# Patient Record
Sex: Male | Born: 1960 | Race: White | Hispanic: No | Smoking: Former smoker
Health system: Southern US, Community
[De-identification: ages and names within clinical notes are randomized; demographics above are authoritative.]

## PROBLEM LIST (undated history)

## (undated) DIAGNOSIS — J8410 Pulmonary fibrosis, unspecified: Secondary | ICD-10-CM

## (undated) DIAGNOSIS — R197 Diarrhea, unspecified: Secondary | ICD-10-CM

## (undated) DIAGNOSIS — M1990 Unspecified osteoarthritis, unspecified site: Secondary | ICD-10-CM

## (undated) DIAGNOSIS — S0990XA Unspecified injury of head, initial encounter: Secondary | ICD-10-CM

## (undated) DIAGNOSIS — G8929 Other chronic pain: Secondary | ICD-10-CM

## (undated) DIAGNOSIS — G4730 Sleep apnea, unspecified: Secondary | ICD-10-CM

## (undated) DIAGNOSIS — J849 Interstitial pulmonary disease, unspecified: Secondary | ICD-10-CM

## (undated) DIAGNOSIS — E119 Type 2 diabetes mellitus without complications: Secondary | ICD-10-CM

## (undated) DIAGNOSIS — Z87442 Personal history of urinary calculi: Secondary | ICD-10-CM

## (undated) DIAGNOSIS — F419 Anxiety disorder, unspecified: Secondary | ICD-10-CM

## (undated) DIAGNOSIS — F988 Other specified behavioral and emotional disorders with onset usually occurring in childhood and adolescence: Secondary | ICD-10-CM

## (undated) HISTORY — DX: Diarrhea, unspecified: R19.7

## (undated) HISTORY — PX: HIP SURGERY: SHX245

---

## 2000-10-12 ENCOUNTER — Emergency Department (HOSPITAL_COMMUNITY): Admission: EM | Admit: 2000-10-12 | Discharge: 2000-10-12 | Payer: Self-pay | Admitting: Emergency Medicine

## 2001-04-17 ENCOUNTER — Ambulatory Visit (HOSPITAL_COMMUNITY): Admission: RE | Admit: 2001-04-17 | Discharge: 2001-04-17 | Payer: Self-pay | Admitting: Family Medicine

## 2001-04-17 ENCOUNTER — Encounter: Payer: Self-pay | Admitting: Family Medicine

## 2001-07-26 ENCOUNTER — Encounter: Admission: RE | Admit: 2001-07-26 | Discharge: 2001-10-24 | Payer: Self-pay | Admitting: Orthopedic Surgery

## 2003-06-02 ENCOUNTER — Encounter: Admission: RE | Admit: 2003-06-02 | Discharge: 2003-08-31 | Payer: Self-pay | Admitting: Orthopaedic Surgery

## 2003-12-30 ENCOUNTER — Encounter: Admission: RE | Admit: 2003-12-30 | Discharge: 2004-01-28 | Payer: Self-pay | Admitting: Orthopaedic Surgery

## 2010-02-04 ENCOUNTER — Encounter
Admission: RE | Admit: 2010-02-04 | Discharge: 2010-02-09 | Payer: Self-pay | Admitting: Physical Medicine & Rehabilitation

## 2010-02-09 ENCOUNTER — Ambulatory Visit: Payer: Self-pay | Admitting: Physical Medicine & Rehabilitation

## 2014-05-04 ENCOUNTER — Encounter (HOSPITAL_COMMUNITY): Payer: Self-pay | Admitting: Emergency Medicine

## 2014-05-04 ENCOUNTER — Emergency Department (HOSPITAL_COMMUNITY)
Admission: EM | Admit: 2014-05-04 | Discharge: 2014-05-04 | Disposition: A | Discharge status: Home / Self Care | Payer: PRIVATE HEALTH INSURANCE | Attending: Emergency Medicine | Admitting: Emergency Medicine

## 2014-05-04 DIAGNOSIS — Z79899 Other long term (current) drug therapy: Secondary | ICD-10-CM | POA: Diagnosis not present

## 2014-05-04 DIAGNOSIS — M25569 Pain in unspecified knee: Secondary | ICD-10-CM | POA: Diagnosis not present

## 2014-05-04 DIAGNOSIS — L03211 Cellulitis of face: Secondary | ICD-10-CM | POA: Diagnosis not present

## 2014-05-04 DIAGNOSIS — L0390 Cellulitis, unspecified: Secondary | ICD-10-CM

## 2014-05-04 DIAGNOSIS — G8929 Other chronic pain: Secondary | ICD-10-CM | POA: Diagnosis not present

## 2014-05-04 DIAGNOSIS — F17200 Nicotine dependence, unspecified, uncomplicated: Secondary | ICD-10-CM | POA: Insufficient documentation

## 2014-05-04 DIAGNOSIS — F988 Other specified behavioral and emotional disorders with onset usually occurring in childhood and adolescence: Secondary | ICD-10-CM | POA: Diagnosis not present

## 2014-05-04 DIAGNOSIS — A4902 Methicillin resistant Staphylococcus aureus infection, unspecified site: Secondary | ICD-10-CM | POA: Insufficient documentation

## 2014-05-04 DIAGNOSIS — L0201 Cutaneous abscess of face: Secondary | ICD-10-CM | POA: Insufficient documentation

## 2014-05-04 DIAGNOSIS — B9562 Methicillin resistant Staphylococcus aureus infection as the cause of diseases classified elsewhere: Secondary | ICD-10-CM

## 2014-05-04 DIAGNOSIS — M25552 Pain in left hip: Secondary | ICD-10-CM

## 2014-05-04 DIAGNOSIS — R21 Rash and other nonspecific skin eruption: Secondary | ICD-10-CM | POA: Insufficient documentation

## 2014-05-04 HISTORY — DX: Other specified behavioral and emotional disorders with onset usually occurring in childhood and adolescence: F98.8

## 2014-05-04 HISTORY — DX: Other chronic pain: G89.29

## 2014-05-04 LAB — SEDIMENTATION RATE: SED RATE: 40 mm/h — AB (ref 0–16)

## 2014-05-04 LAB — CBC WITH DIFFERENTIAL/PLATELET
BASOS ABS: 0.1 10*3/uL (ref 0.0–0.1)
BASOS PCT: 1 % (ref 0–1)
EOS ABS: 0 10*3/uL (ref 0.0–0.7)
EOS PCT: 0 % (ref 0–5)
HEMATOCRIT: 55.6 % — AB (ref 39.0–52.0)
HEMOGLOBIN: 19.1 g/dL — AB (ref 13.0–17.0)
Lymphocytes Relative: 29 % (ref 12–46)
Lymphs Abs: 3.8 10*3/uL (ref 0.7–4.0)
MCH: 32.5 pg (ref 26.0–34.0)
MCHC: 34.4 g/dL (ref 30.0–36.0)
MCV: 94.7 fL (ref 78.0–100.0)
MONO ABS: 1.2 10*3/uL — AB (ref 0.1–1.0)
MONOS PCT: 9 % (ref 3–12)
Neutro Abs: 8.1 10*3/uL — ABNORMAL HIGH (ref 1.7–7.7)
Neutrophils Relative %: 62 % (ref 43–77)
Platelets: 265 10*3/uL (ref 150–400)
RBC: 5.87 MIL/uL — ABNORMAL HIGH (ref 4.22–5.81)
RDW: 13.7 % (ref 11.5–15.5)
WBC: 13.2 10*3/uL — ABNORMAL HIGH (ref 4.0–10.5)

## 2014-05-04 MED ORDER — OXYCODONE-ACETAMINOPHEN 7.5-325 MG PO TABS: 1.0000 | ORAL_TABLET | ORAL | Status: DC | PRN

## 2014-05-04 MED ORDER — DOXYCYCLINE HYCLATE 100 MG PO CAPS: 100.0000 mg | ORAL_CAPSULE | Freq: Two times a day (BID) | ORAL | Status: DC

## 2014-05-04 MED ORDER — KETOROLAC TROMETHAMINE 60 MG/2ML IM SOLN
60.0000 mg | Freq: Once | INTRAMUSCULAR | Status: AC
  Administered 2014-05-04: 60 mg via INTRAMUSCULAR
  Filled 2014-05-04: qty 2

## 2014-05-04 MED ORDER — MORPHINE SULFATE 4 MG/ML IJ SOLN
8.0000 mg | Freq: Once | INTRAMUSCULAR | Status: AC
  Administered 2014-05-04: 8 mg via INTRAMUSCULAR
  Filled 2014-05-04: qty 2

## 2014-05-04 NOTE — ED Notes (Addendum)
Pt reports recently having an abscess on his back, has been treated with antibiotics which he finished taking. Now having rash and pain to his face and severe pain to left hip and leg which he has hx of left hip surgery. Airway intact.

## 2014-05-04 NOTE — Discharge Instructions (Signed)
MRSA Infection MRSA stands for methicillin-resistant Staphylococcus aureus. This type of infection is caused by Staphylococcus aureus bacteria that are no longer affected by the medicines used to kill them (drug resistant). Staphylococcus (staph) bacteria are normally found on the skin or in the nose of healthy people. In most cases, these bacteria do not cause infection. But if these resistant bacteria enter your body through a cut or sore, they can cause a serious infection on your skin or in other parts of your body. There is a slight chance that the staph on your skin or in your nose is MRSA. There are two types of MRSA infections:  Hospital-acquired MRSA is bacteria that you get in the hospital.  Community-acquired MRSA is bacteria that you get somewhere other than in a hospital. RISK FACTORS Hospital-acquired MRSA is more common. You could be at risk for this infection if you are in the hospital and you:  Have surgery or a procedure.  Have an IV access or a catheter tube placed in your body.  Have weak resistance to germs (weakened immune system).  Are elderly.  Are on kidney dialysis. You could be at risk for community-acquired MRSA if you have a break in your skin and come into contact with MRSA. This may happen if you:  Play sports where there is skin-to-skin contact.  Live in a crowded setting, like a dormitory or a military barracks.  Share towels, razors, or sports equipment with other people. SYMPTOMS  Symptoms of hospital-acquired MRSA depend on where MRSA has spread. Symptoms may include:  Wound infection.  Skin infection.  Rash.  Pneumonia.  Fever and chills.  Difficulty breathing.  Chest pain. Community-acquired MRSA is most likely to start as a scratch or cut that becomes infected. Symptoms may include:  A pus-filled pimple.  A boil on your skin.  Pus draining from your skin.  A sore (abscess) under your skin or somewhere in your body.  Fever  with or without chills. DIAGNOSIS  The diagnosis of MRSA is made by taking a sample from an infected area and sending it to a lab for testing. A lab technician can grow (culture) MRSA and check it under a microscope. The cultured MRSA can be tested to see which type of antibiotic medicine will work to treat it. Newer tests can identify MRSA more quickly by testing bacteria samples for MRSA genes. Your health care provider can diagnose MRSA using samples from:   Cuts or wounds in infected areas.  Nasal swabs.  Saliva or cough specimens from deep in the lungs (sputum).  Urine.  Blood. You may also have:  Imaging studies (such as X-ray or MRI) to check if the infection has spread to the lungs, bones, or joints.  A culture and sensitivity test of blood or fluids from inside the joints. TREATMENT  Treatment depends on how severe, deep, or extensive the infection is. Very bad infections may require a hospital stay.  Some skin infections, such as a small boil or sore (abscess), may be treated by draining pus from the site of the infection.  More extensive surgery to drain pus may be necessary for deeper or more widespread soft tissue infections.  You may then have to take antibiotic medicine given by mouth or through a vein. You may start antibiotic treatment right away or after testing can be done to see what antibiotic medicine should be used. HOME CARE INSTRUCTIONS   Take your antibiotics as directed by your health care provider. Take   the medicine as prescribed until it is finished.  Avoid close contact with those around you as much as possible. Do not use towels, razors, toothbrushes, bedding, or other items that will be used by others.  Wash your hands frequently for 15 seconds with soap and water. Dry your hands with a clean or disposable towel.  When you are not able to wash your hands, use hand sanitizer that is more than 60 percent alcohol.  Wash towels, sheets, or clothes in  the washing machine with detergent and hot water. Dry them in a hot dryer.  Follow your health care provider's instructions for wound care. Wash your hands before and after changing your bandages.  Always shower after exercising.  Keep all cuts and scrapes clean and covered with a bandage.  Be sure to tell all your health care providers that you have MRSA so they are aware of your infection. SEEK MEDICAL CARE IF:  You have a cut, scrape, pimple, or boil that becomes red, swollen, or painful or has pus in it.  You have pus draining from your skin.  You have an abscess under your skin or somewhere in your body. SEEK IMMEDIATE MEDICAL CARE IF:   You have symptoms of a skin infection with a fever or chills.  You have trouble breathing.  You have chest pain.  You have a skin wound and you become nauseous or start vomiting. MAKE SURE YOU:  Understand these instructions.  Will watch your condition.  Will get help right away if you are not doing well or get worse. Document Released: 09/05/2005 Document Revised: 09/10/2013 Document Reviewed: 06/28/2013 [employer] Patient Information 2015 [employer]. This information is not intended to replace advice given to you by your health care provider. Make sure you discuss any questions you have with your health care provider.  

## 2014-05-04 NOTE — ED Notes (Signed)
MD at bedside. 

## 2014-05-04 NOTE — ED Provider Notes (Signed)
CSN: [id number]     Arrival date & time 05/04/14  1102 History   First MD Initiated Contact with Patient 05/04/14 1152     Chief Complaint  Patient presents with  . Rash  . Pain     (Consider location/radiation/quality/duration/timing/severity/associated sxs/prior Treatment) HPI Comments: Patient here complaining of persistent left-sided hip pain. History of chronic pain schedule his orthopedist in 3 days. He also notes rash on his face which started on his back and was seen by a physician that this past week and placed on antibiotics which she has completed. Has had subjective fevers at home without vomiting or diarrhea. Rash is not pruritic to his face. Denies any shortness of breath. Denies any foot drop when he walks. Symptoms are worse with walking and characterized as sharp pain and better with rest. He does use a cane to ambulate  Patient is a 53 y.o. male presenting with rash. The history is provided by the patient.  Rash   Past Medical History  Diagnosis Date  . Chronic pain   . ADD (attention deficit disorder)    History reviewed. No pertinent past surgical history. History reviewed. No pertinent family history. History  Substance Use Topics  . Smoking status: Current Every Day Smoker    Types: Cigarettes  . Smokeless tobacco: Not on file  . Alcohol Use: No    Review of Systems  Skin: Positive for rash.  All other systems reviewed and are negative.     Allergies  Codeine and Macrodantin  Home Medications   Prior to Admission medications   Medication Sig Start Date End Date Taking? Authorizing Provider  ALPRAZolam Duanne Moron) 0.5 MG tablet Take 0.5 mg by mouth at bedtime as needed for anxiety.   Yes Historical Provider, MD  amphetamine-dextroamphetamine (ADDERALL) 30 MG tablet Take 30 mg by mouth daily.   Yes Historical Provider, MD  cephALEXin (KEFLEX) 500 MG capsule Take 500 mg by mouth 4 (four) times daily.   Yes Historical Provider, MD  tiZANidine (ZANAFLEX)  2 MG tablet Take 2 mg by mouth every 6 (six) hours as needed for muscle spasms.   Yes Historical Provider, MD  traMADol (ULTRAM) 50 MG tablet Take 50 mg by mouth every 6 (six) hours as needed for moderate pain or severe pain.   Yes Historical Provider, MD   BP 133/93  Pulse 96  Temp(Src) 97.8 F (36.6 C) (Oral)  Resp 18  Ht 6\' 1"  (1.854 m)  Wt 197 lb (89.359 kg)  BMI 26.00 kg/m2  SpO2 100% Physical Exam  Nursing note and vitals reviewed. Constitutional: He is oriented to person, place, and time. He appears well-developed and well-nourished.  Non-toxic appearance. No distress.  HENT:  Head: Normocephalic and atraumatic.    Eyes: Conjunctivae, EOM and lids are normal. Pupils are equal, round, and reactive to light.  Neck: Normal range of motion. Neck supple. No tracheal deviation present. No mass present.  Cardiovascular: Normal rate, regular rhythm and normal heart sounds.  Exam reveals no gallop.   No murmur heard. Pulmonary/Chest: Effort normal and breath sounds normal. No stridor. No respiratory distress. He has no decreased breath sounds. He has no wheezes. He has no rhonchi. He has no rales.  Abdominal: Soft. Normal appearance and bowel sounds are normal. He exhibits no distension. There is no tenderness. There is no rebound and no CVA tenderness.  Musculoskeletal: Normal range of motion. He exhibits no edema and no tenderness.  Pain with range of motion of hip. No erythema  noted at the lateral aspect of the left hip where patient is mostly pinpoint tender. Neurovascular intact distally. No shortening or rotation noted  Neurological: He is alert and oriented to person, place, and time. He has normal strength. No cranial nerve deficit or sensory deficit. GCS eye subscore is 4. GCS verbal subscore is 5. GCS motor subscore is 6.  Skin: Skin is warm and dry. Rash noted. No purpura noted. Rash is maculopapular.  Psychiatric: He has a normal mood and affect. His speech is normal and  behavior is normal.    ED Course  Procedures (including critical care time) Labs Review Labs Reviewed  CULTURE, BLOOD (ROUTINE X 2)  CULTURE, BLOOD (ROUTINE X 2)  CBC WITH DIFFERENTIAL  SEDIMENTATION RATE    Imaging Review No results found.   EKG Interpretation None      MDM   Final diagnoses:  None    Patient given pain medications here. Did not believe that he has a septic joint. Hemodynamic are stable. Will treat MRSA with a prescription for doxycycline and give opiate for pain    Leota Jacobsen, MD 05/04/14 1512

## 2014-05-04 NOTE — ED Notes (Addendum)
Pt states he has pain in left hip due to infection. Has appt to see MD on Wednesday. Full ROM of hip

## 2014-05-09 ENCOUNTER — Inpatient Hospital Stay (HOSPITAL_COMMUNITY)
Admission: EM | Admit: 2014-05-09 | Discharge: 2014-05-12 | DRG: 603 | Disposition: A | Discharge status: Home / Self Care | Payer: PRIVATE HEALTH INSURANCE | Attending: Internal Medicine | Admitting: Internal Medicine

## 2014-05-09 ENCOUNTER — Encounter (HOSPITAL_COMMUNITY): Payer: Self-pay | Admitting: Emergency Medicine

## 2014-05-09 ENCOUNTER — Inpatient Hospital Stay (HOSPITAL_COMMUNITY): Payer: PRIVATE HEALTH INSURANCE

## 2014-05-09 ENCOUNTER — Emergency Department (HOSPITAL_COMMUNITY): Payer: PRIVATE HEALTH INSURANCE

## 2014-05-09 DIAGNOSIS — L03211 Cellulitis of face: Principal | ICD-10-CM

## 2014-05-09 DIAGNOSIS — M25559 Pain in unspecified hip: Secondary | ICD-10-CM | POA: Diagnosis present

## 2014-05-09 DIAGNOSIS — L03319 Cellulitis of trunk, unspecified: Secondary | ICD-10-CM

## 2014-05-09 DIAGNOSIS — L02419 Cutaneous abscess of limb, unspecified: Secondary | ICD-10-CM | POA: Diagnosis present

## 2014-05-09 DIAGNOSIS — L03119 Cellulitis of unspecified part of limb: Secondary | ICD-10-CM

## 2014-05-09 DIAGNOSIS — R21 Rash and other nonspecific skin eruption: Secondary | ICD-10-CM | POA: Diagnosis present

## 2014-05-09 DIAGNOSIS — L0201 Cutaneous abscess of face: Principal | ICD-10-CM | POA: Diagnosis present

## 2014-05-09 DIAGNOSIS — F17210 Nicotine dependence, cigarettes, uncomplicated: Secondary | ICD-10-CM

## 2014-05-09 DIAGNOSIS — F17200 Nicotine dependence, unspecified, uncomplicated: Secondary | ICD-10-CM | POA: Diagnosis present

## 2014-05-09 DIAGNOSIS — L02818 Cutaneous abscess of other sites: Secondary | ICD-10-CM

## 2014-05-09 DIAGNOSIS — L03818 Cellulitis of other sites: Secondary | ICD-10-CM

## 2014-05-09 DIAGNOSIS — G8929 Other chronic pain: Secondary | ICD-10-CM | POA: Diagnosis present

## 2014-05-09 DIAGNOSIS — R509 Fever, unspecified: Secondary | ICD-10-CM

## 2014-05-09 DIAGNOSIS — F988 Other specified behavioral and emotional disorders with onset usually occurring in childhood and adolescence: Secondary | ICD-10-CM | POA: Diagnosis present

## 2014-05-09 DIAGNOSIS — N179 Acute kidney failure, unspecified: Secondary | ICD-10-CM | POA: Diagnosis present

## 2014-05-09 DIAGNOSIS — R7989 Other specified abnormal findings of blood chemistry: Secondary | ICD-10-CM | POA: Diagnosis present

## 2014-05-09 DIAGNOSIS — L02219 Cutaneous abscess of trunk, unspecified: Secondary | ICD-10-CM | POA: Diagnosis present

## 2014-05-09 DIAGNOSIS — M25552 Pain in left hip: Secondary | ICD-10-CM

## 2014-05-09 DIAGNOSIS — R945 Abnormal results of liver function studies: Secondary | ICD-10-CM

## 2014-05-09 LAB — CBC WITH DIFFERENTIAL/PLATELET
BASOS PCT: 0 % (ref 0–1)
Basophils Absolute: 0.1 10*3/uL (ref 0.0–0.1)
Eosinophils Absolute: 0 10*3/uL (ref 0.0–0.7)
Eosinophils Relative: 0 % (ref 0–5)
HCT: 48.2 % (ref 39.0–52.0)
Hemoglobin: 16.7 g/dL (ref 13.0–17.0)
Lymphocytes Relative: 22 % (ref 12–46)
Lymphs Abs: 3.6 10*3/uL (ref 0.7–4.0)
MCH: 32.9 pg (ref 26.0–34.0)
MCHC: 34.6 g/dL (ref 30.0–36.0)
MCV: 95.1 fL (ref 78.0–100.0)
MONOS PCT: 9 % (ref 3–12)
Monocytes Absolute: 1.5 10*3/uL — ABNORMAL HIGH (ref 0.1–1.0)
NEUTROS ABS: 11.7 10*3/uL — AB (ref 1.7–7.7)
NEUTROS PCT: 69 % (ref 43–77)
Platelets: 425 10*3/uL — ABNORMAL HIGH (ref 150–400)
RBC: 5.07 MIL/uL (ref 4.22–5.81)
RDW: 13.5 % (ref 11.5–15.5)
WBC: 16.9 10*3/uL — ABNORMAL HIGH (ref 4.0–10.5)

## 2014-05-09 LAB — CBC
HEMATOCRIT: 43.1 % (ref 39.0–52.0)
HEMOGLOBIN: 14.8 g/dL (ref 13.0–17.0)
MCH: 31.7 pg (ref 26.0–34.0)
MCHC: 34.3 g/dL (ref 30.0–36.0)
MCV: 92.3 fL (ref 78.0–100.0)
Platelets: 426 10*3/uL — ABNORMAL HIGH (ref 150–400)
RBC: 4.67 MIL/uL (ref 4.22–5.81)
RDW: 13.5 % (ref 11.5–15.5)
WBC: 16 10*3/uL — ABNORMAL HIGH (ref 4.0–10.5)

## 2014-05-09 LAB — PROTIME-INR
INR: 1.12 (ref 0.00–1.49)
Prothrombin Time: 14.4 seconds (ref 11.6–15.2)

## 2014-05-09 LAB — COMPREHENSIVE METABOLIC PANEL
ALBUMIN: 3.1 g/dL — AB (ref 3.5–5.2)
ALK PHOS: 157 U/L — AB (ref 39–117)
ALT: 224 U/L — ABNORMAL HIGH (ref 0–53)
ANION GAP: 11 (ref 5–15)
AST: 108 U/L — AB (ref 0–37)
BUN: 13 mg/dL (ref 6–23)
CHLORIDE: 97 meq/L (ref 96–112)
CO2: 28 mEq/L (ref 19–32)
CREATININE: 1.17 mg/dL (ref 0.50–1.35)
Calcium: 10 mg/dL (ref 8.4–10.5)
GFR calc Af Amer: 82 mL/min — ABNORMAL LOW (ref >90)
GFR calc non Af Amer: 71 mL/min — ABNORMAL LOW (ref >90)
Glucose, Bld: 106 mg/dL — ABNORMAL HIGH (ref 70–99)
POTASSIUM: 4.5 meq/L (ref 3.7–5.3)
Sodium: 136 mEq/L — ABNORMAL LOW (ref 137–147)
TOTAL PROTEIN: 8.1 g/dL (ref 6.0–8.3)
Total Bilirubin: 0.4 mg/dL (ref 0.3–1.2)

## 2014-05-09 LAB — CREATININE, SERUM
Creatinine, Ser: 1.39 mg/dL — ABNORMAL HIGH (ref 0.50–1.35)
GFR calc Af Amer: 66 mL/min — ABNORMAL LOW (ref >90)
GFR calc non Af Amer: 57 mL/min — ABNORMAL LOW (ref >90)

## 2014-05-09 LAB — ACETAMINOPHEN LEVEL: Acetaminophen (Tylenol), Serum: <15 ug/mL (ref 10–30)

## 2014-05-09 MED ORDER — ONDANSETRON HCL 4 MG PO TABS: 4.0000 mg | ORAL_TABLET | Freq: Four times a day (QID) | ORAL | Status: DC | PRN

## 2014-05-09 MED ORDER — MORPHINE SULFATE 2 MG/ML IJ SOLN
1.0000 mg | INTRAMUSCULAR | Status: DC | PRN
  Administered 2014-05-10: 1 mg via INTRAVENOUS
  Filled 2014-05-09: qty 1

## 2014-05-09 MED ORDER — TRAMADOL HCL 50 MG PO TABS
50.0000 mg | ORAL_TABLET | Freq: Four times a day (QID) | ORAL | Status: DC | PRN
Start: 2014-05-09 — End: 2014-05-12

## 2014-05-09 MED ORDER — PIPERACILLIN-TAZOBACTAM 3.375 G IVPB
3.3750 g | Freq: Three times a day (TID) | INTRAVENOUS | Status: DC
  Administered 2014-05-09 – 2014-05-12 (×8): 3.375 g via INTRAVENOUS
  Filled 2014-05-09 (×10): qty 50

## 2014-05-09 MED ORDER — SODIUM CHLORIDE 0.9 % IV SOLN
INTRAVENOUS | Status: AC
  Administered 2014-05-09 – 2014-05-10 (×2): via INTRAVENOUS

## 2014-05-09 MED ORDER — ONDANSETRON HCL 4 MG/2ML IJ SOLN: 4.0000 mg | Freq: Four times a day (QID) | INTRAMUSCULAR | Status: DC | PRN

## 2014-05-09 MED ORDER — ACETAMINOPHEN 325 MG PO TABS
650.0000 mg | ORAL_TABLET | Freq: Once | ORAL | Status: AC
  Administered 2014-05-09: 650 mg via ORAL
  Filled 2014-05-09: qty 2

## 2014-05-09 MED ORDER — TIZANIDINE HCL 2 MG PO TABS
2.0000 mg | ORAL_TABLET | Freq: Four times a day (QID) | ORAL | Status: DC | PRN
  Filled 2014-05-09: qty 1

## 2014-05-09 MED ORDER — OXYCODONE HCL 5 MG PO TABS
5.0000 mg | ORAL_TABLET | ORAL | Status: DC | PRN
  Administered 2014-05-09 – 2014-05-12 (×8): 5 mg via ORAL
  Filled 2014-05-09 (×8): qty 1

## 2014-05-09 MED ORDER — ALPRAZOLAM 0.5 MG PO TABS: 0.5000 mg | ORAL_TABLET | Freq: Every evening | ORAL | Status: DC | PRN

## 2014-05-09 MED ORDER — ENOXAPARIN SODIUM 40 MG/0.4ML ~~LOC~~ SOLN
40.0000 mg | SUBCUTANEOUS | Status: DC
  Administered 2014-05-09 – 2014-05-11 (×3): 40 mg via SUBCUTANEOUS
  Filled 2014-05-09 (×6): qty 0.4

## 2014-05-09 MED ORDER — AMPHETAMINE-DEXTROAMPHETAMINE 30 MG PO TABS: 15.0000 mg | ORAL_TABLET | Freq: Two times a day (BID) | ORAL | Status: DC

## 2014-05-09 MED ORDER — VANCOMYCIN HCL IN DEXTROSE 1-5 GM/200ML-% IV SOLN
1000.0000 mg | Freq: Two times a day (BID) | INTRAVENOUS | Status: DC
  Administered 2014-05-09 – 2014-05-12 (×6): 1000 mg via INTRAVENOUS
  Filled 2014-05-09 (×7): qty 200

## 2014-05-09 MED ORDER — AMPHETAMINE-DEXTROAMPHETAMINE 10 MG PO TABS
15.0000 mg | ORAL_TABLET | Freq: Two times a day (BID) | ORAL | Status: DC
  Administered 2014-05-10 – 2014-05-12 (×3): 15 mg via ORAL
  Filled 2014-05-09 (×4): qty 2

## 2014-05-09 MED ORDER — CLINDAMYCIN PHOSPHATE 600 MG/50ML IV SOLN
600.0000 mg | Freq: Once | INTRAVENOUS | Status: AC
  Administered 2014-05-09: 600 mg via INTRAVENOUS
  Filled 2014-05-09: qty 50

## 2014-05-09 NOTE — ED Notes (Signed)
Patient transported to Ultrasound 

## 2014-05-09 NOTE — H&P (Signed)
[facility] History and Physical  Erik Strong FYB:[id number] DOB: Oct 08, 1960 DOA: 05/09/2014  Referring physician: ER physician. PCP: No primary provider on file.  Chief Complaint: Multiple boils and pain.  HPI: Erik Strong is a 53 y.o. male with history of chronic pain and ADD noticed a boil in his left shoulder area 2 weeks ago and had gone to his PCP who had prescribed antibiotics for 5 days. Despite taking which patient's boils started to worsen and involved other areas. Patient also started having left hip and thigh pain and had come to the ER. In the ER patient was given pain medications and discharged on doxycycline. Patient has been taking doxycycline last few days despite which his skin boils has getting worse with pain. Denies any oral lesions. Denies any nausea vomiting or abdominal pain or diarrhea. In the ER patient was found to be febrile. Blood cultures have been obtained and has been admitted for further management. Patient on his skin has multiple boils involving his face some on his trunk and also many on his lower extremity. Has had CAT scan is on his lower extremities. Does not recall having any definite insect bites or any recent travels.   Review of Systems: As presented in the history of presenting illness, rest negative.  Past Medical History  Diagnosis Date  . Chronic pain   . ADD (attention deficit disorder)    Past Surgical History  Procedure Laterality Date  . Hip surgery     Social History:  reports that he has been smoking Cigarettes.  He has been smoking about 0.00 packs per day. He does not have any smokeless tobacco history on file. He reports that he does not drink alcohol or use illicit drugs. Where does patient live home. Can patient participate in ADLs? Yes.  Allergies  Allergen Reactions  . Codeine Nausea And Vomiting  . Macrodantin [Nitrofurantoin Macrocrystal] Rash    Family History:  Family History  Problem Relation Age of Onset  .  Prostate cancer Father   . CAD Maternal Uncle       Prior to Admission medications   Medication Sig Start Date End Date Taking? Authorizing Provider  acetaminophen (TYLENOL) 650 MG CR tablet Take 650 mg by mouth every 8 (eight) hours as needed for pain.   Yes Historical Provider, MD  ALPRAZolam Duanne Moron) 0.5 MG tablet Take 0.5 mg by mouth at bedtime as needed for anxiety.   Yes Historical Provider, MD  amphetamine-dextroamphetamine (ADDERALL) 30 MG tablet Take 15 mg by mouth 2 (two) times daily.    Yes Historical Provider, MD  doxycycline (VIBRAMYCIN) 100 MG capsule Take 1 capsule (100 mg total) by mouth 2 (two) times daily. 05/04/14  Yes Erik Jacobsen, MD  oxyCODONE-acetaminophen (PERCOCET) 7.5-325 MG per tablet Take 1 tablet by mouth every 4 (four) hours as needed for pain. 05/04/14  Yes Erik Jacobsen, MD  tiZANidine (ZANAFLEX) 2 MG tablet Take 2 mg by mouth every 6 (six) hours as needed for muscle spasms.   Yes Historical Provider, MD  traMADol (ULTRAM) 50 MG tablet Take 50 mg by mouth every 6 (six) hours as needed for moderate pain or severe pain.   Yes Historical Provider, MD    Physical Exam: Filed Vitals:   05/09/14 1436 05/09/14 1452 05/09/14 1752 05/09/14 1800  BP: 129/80  117/79 117/71  Pulse: 90  79 67  Temp: 101.1 F (38.4 C) 99.8 F (37.7 C) 98.5 F (36.9 C)   TempSrc: Oral Oral  Oral   Resp: 20  17 14   SpO2: 99%  98% 96%     General:  Well-developed and nourished.  Eyes: Anicteric no pallor.  ENT: No obvious oral lesions. No discharge from the ears eyes nose mouth.  Neck: No neck tenderness or rigidity.  Cardiovascular: S1-S2 heard.  Respiratory: No rhonchi or crepitations.  Abdomen: Soft nontender bowel sounds present. No guarding rigidity.  Skin: Multiple skin boils involving multiple areas body mostly in the face extremities.  Musculoskeletal: No edema. Pain on the left hip area and distal femur.  Psychiatric: Appears normal.  Neurologic: Alert awake  oriented to time place and person. Moves all extremities.  Labs on Admission:  Basic Metabolic Panel:  Recent Labs Lab 05/09/14 1438  NA 136*  K 4.5  CL 97  CO2 28  GLUCOSE 106*  BUN 13  CREATININE 1.17  CALCIUM 10.0   Liver Function Tests:  Recent Labs Lab 05/09/14 1438  AST 108*  ALT 224*  ALKPHOS 157*  BILITOT 0.4  PROT 8.1  ALBUMIN 3.1*   No results found for this basename: LIPASE, AMYLASE,  in the last 168 hours No results found for this basename: AMMONIA,  in the last 168 hours CBC:  Recent Labs Lab 05/04/14 1255 05/09/14 1438  WBC 13.2* 16.9*  NEUTROABS 8.1* 11.7*  HGB 19.1* 16.7  HCT 55.6* 48.2  MCV 94.7 95.1  PLT 265 425*   Cardiac Enzymes: No results found for this basename: CKTOTAL, CKMB, CKMBINDEX, TROPONINI,  in the last 168 hours  BNP (last 3 results) No results found for this basename: PROBNP,  in the last 8760 hours CBG: No results found for this basename: GLUCAP,  in the last 168 hours  Radiological Exams on Admission: US Abdomen Limited Ruq  05/09/2014   CLINICAL DATA:  53 year old male with abdominal pain and leukocytosis. Initial encounter.  EXAM: US ABDOMEN LIMITED - RIGHT UPPER QUADRANT  COMPARISON:  None.  FINDINGS: Gallbladder:  Several 2-3 mm non shadowing echogenic foci within the gallbladder lumen could be small stones or inconsequential polyps (image 35). Gallbladder wall thickness remains normal. No pericholecystic fluid. No sonographic Murphy sign elicited. No intrahepatic biliary ductal dilatation.  Common bile duct:  Diameter: 4 mm, normal.  Liver:  No focal lesion identified. Within normal limits in parenchymal echogenicity.  Other findings:  Negative visible right kidney.  IMPRESSION: 1. No evidence of acute cholecystitis. Multiple small (up to 3 mm) gallbladder polyps versus non shadowing stones. 2. Otherwise negative.   Electronically Signed   By: Lars Pinks M.D.   On: 05/09/2014 18:57     Assessment/Plan Principal  Problem:   Cellulitis Active Problems:   Elevated LFTs   Chronic pain   1. Cellulitis/folliculitis involving multiple areas of the body - at this time blood cultures have been obtained and I have placed patient empirically on vancomycin and Zosyn. Closely follow progression of the skin lesion and may need infectious disease consult. Patient states he has had tetanus toxoid injection within the last 5 years. 2. Elevated LFTs - could be secondary to infectious process due to #1 or be reaction to his medication. Denies having had any excessive use of alcohol or Tylenol. Check acute hepatitis panel and Tylenol levels. Check INR. Sonogram of abdomen is showing multiple small gallbladder polyps versus non-shadowing stones. Closely follow LFTs. I have ordered HIDA scan. Abdomen is benign on exam. 3. Left leg pain with previous history of ORIF - I have ordered x-rays of the left  hip and femur. If pain persists may need MRI to rule out any developing osteomyelitis. 4. Chronic pain and ADD - continue home medications.    Code Status: Full code.  Family Communication: None.  Disposition Plan: Admit to inpatient.    KAKRAKANDY,ARSHAD N. [facility] Pager [phone].  If 7PM-7AM, please contact night-coverage [email or web address] Password [email or web address] 05/09/2014, 8:11 PM

## 2014-05-09 NOTE — ED Provider Notes (Signed)
CSN: [id number]     Arrival date & time 05/09/14  1145 History   First MD Initiated Contact with Patient 05/09/14 1639     Chief Complaint  Patient presents with  . Recurrent Skin Infections   Erik Strong is a 53 yo caucasian M w/recurrent skin infxns. Patient seen here approximately one week ago with similar rash. Patient says this began when his parents scratched his leg. Since then he's had a rash on his face and lower extremities. Rash is not itchy but it is painful. He also endorses fevers and chills. Patient concerned as to antibiotics given last week seemed to be having no effect.  He denies CP, SOB, N/V, diarrhea, constipation, hematemesis, dysuria, hematuria, sick contacts, or recent travel.   (Consider location/radiation/quality/duration/timing/severity/associated sxs/prior Treatment) Patient is a 53 y.o. male presenting with rash. The history is provided by the patient.  Rash Location:  Head/neck and leg Head/neck rash location:  Head Leg rash location:  L upper leg, R upper leg, L lower leg and R lower leg Quality: painful, redness and swelling   Quality: not itchy   Ineffective treatments: Doxycycline. Associated symptoms: fever   Associated symptoms: no abdominal pain, no diarrhea, no headaches, no myalgias, no nausea, no shortness of breath, no sore throat, no throat swelling, no URI and not vomiting     Past Medical History  Diagnosis Date  . Chronic pain   . ADD (attention deficit disorder)    Past Surgical History  Procedure Laterality Date  . Hip surgery     Family History  Problem Relation Age of Onset  . Prostate cancer Father   . CAD Maternal Uncle    History  Substance Use Topics  . Smoking status: Current Every Day Smoker    Types: Cigarettes  . Smokeless tobacco: Not on file  . Alcohol Use: No    Review of Systems  Constitutional: Positive for fever. Negative for chills and diaphoresis.  HENT: Negative for sore throat.   Respiratory:  Negative for shortness of breath.   Cardiovascular: Negative for chest pain, palpitations and leg swelling.  Gastrointestinal: Negative for nausea, vomiting, abdominal pain, diarrhea, constipation and abdominal distention.  Genitourinary: Negative for dysuria, frequency, flank pain and decreased urine volume.  Musculoskeletal: Negative for myalgias.  Skin: Positive for rash.  Neurological: Negative for dizziness, speech difficulty, light-headedness and headaches.  All other systems reviewed and are negative.     Allergies  Codeine and Macrodantin  Home Medications   Prior to Admission medications   Medication Sig Start Date End Date Taking? Authorizing Provider  acetaminophen (TYLENOL) 650 MG CR tablet Take 650 mg by mouth every 8 (eight) hours as needed for pain.   Yes Historical Provider, MD  ALPRAZolam Duanne Moron) 0.5 MG tablet Take 0.5 mg by mouth at bedtime as needed for anxiety.   Yes Historical Provider, MD  amphetamine-dextroamphetamine (ADDERALL) 30 MG tablet Take 15 mg by mouth 2 (two) times daily.    Yes Historical Provider, MD  doxycycline (VIBRAMYCIN) 100 MG capsule Take 1 capsule (100 mg total) by mouth 2 (two) times daily. 05/04/14  Yes Leota Jacobsen, MD  oxyCODONE-acetaminophen (PERCOCET) 7.5-325 MG per tablet Take 1 tablet by mouth every 4 (four) hours as needed for pain. 05/04/14  Yes Leota Jacobsen, MD  tiZANidine (ZANAFLEX) 2 MG tablet Take 2 mg by mouth every 6 (six) hours as needed for muscle spasms.   Yes Historical Provider, MD  traMADol (ULTRAM) 50 MG tablet Take 50  mg by mouth every 6 (six) hours as needed for moderate pain or severe pain.   Yes Historical Provider, MD   BP 129/80  Pulse 90  Temp(Src) 99.8 F (37.7 C) (Oral)  Resp 20  SpO2 99% Physical Exam  Nursing note and vitals reviewed. Constitutional: He is oriented to person, place, and time. He appears well-developed and well-nourished. No distress.  HENT:  Head: Normocephalic and atraumatic.   Eyes: Pupils are equal, round, and reactive to light.  Neck: Normal range of motion.  No obvious lymphadenopathy, though pt says he LNs are painful.   Cardiovascular: Normal rate, regular rhythm, normal heart sounds and intact distal pulses.  Exam reveals no gallop and no friction rub.   No murmur heard. Pulmonary/Chest: Effort normal and breath sounds normal. No respiratory distress. He has no wheezes. He has no rales. He exhibits no tenderness.  Abdominal: Soft. Bowel sounds are normal. He exhibits no distension and no mass. There is no tenderness. There is no rebound and no guarding.  Musculoskeletal: Normal range of motion. He exhibits no edema and no tenderness.  Lymphadenopathy:    He has no cervical adenopathy.  Neurological: He is alert and oriented to person, place, and time. No cranial nerve deficit.  Skin: Skin is warm and dry. Rash noted. He is not diaphoretic.  Several spots of varying size on his back, legs, and face. Some spots appear to be like folliculitis and other spots just red.     ED Course  Procedures (including critical care time) Labs Review Labs Reviewed  CBC WITH DIFFERENTIAL - Abnormal; Notable for the following:    WBC 16.9 (*)    Platelets 425 (*)    Neutro Abs 11.7 (*)    Monocytes Absolute 1.5 (*)    All other components within normal limits  COMPREHENSIVE METABOLIC PANEL - Abnormal; Notable for the following:    Sodium 136 (*)    Glucose, Bld 106 (*)    Albumin 3.1 (*)    AST 108 (*)    ALT 224 (*)    Alkaline Phosphatase 157 (*)    GFR calc non Af Amer 71 (*)    GFR calc Af Amer 82 (*)    All other components within normal limits  URINALYSIS, ROUTINE W REFLEX MICROSCOPIC - Abnormal; Notable for the following:    Color, Urine AMBER (*)    All other components within normal limits  CBC - Abnormal; Notable for the following:    WBC 16.0 (*)    Platelets 426 (*)    All other components within normal limits  CREATININE, SERUM - Abnormal;  Notable for the following:    Creatinine, Ser 1.39 (*)    GFR calc non Af Amer 57 (*)    GFR calc Af Amer 66 (*)    All other components within normal limits  CULTURE, BLOOD (ROUTINE X 2)  CULTURE, BLOOD (ROUTINE X 2)  ACETAMINOPHEN LEVEL  PROTIME-INR  HEPATITIS PANEL, ACUTE  COMPREHENSIVE METABOLIC PANEL  CBC WITH DIFFERENTIAL    Imaging Review US Abdomen Limited Ruq  05/09/2014   CLINICAL DATA:  52 year old male with abdominal pain and leukocytosis. Initial encounter.  EXAM: US ABDOMEN LIMITED - RIGHT UPPER QUADRANT  COMPARISON:  None.  FINDINGS: Gallbladder:  Several 2-3 mm non shadowing echogenic foci within the gallbladder lumen could be small stones or inconsequential polyps (image 35). Gallbladder wall thickness remains normal. No pericholecystic fluid. No sonographic Murphy sign elicited. No intrahepatic biliary ductal dilatation.  Common bile duct:  Diameter: 4 mm, normal.  Liver:  No focal lesion identified. Within normal limits in parenchymal echogenicity.  Other findings:  Negative visible right kidney.  IMPRESSION: 1. No evidence of acute cholecystitis. Multiple small (up to 3 mm) gallbladder polyps versus non shadowing stones. 2. Otherwise negative.   Electronically Signed   By: Lars Pinks M.D.   On: 05/09/2014 18:57     EKG Interpretation None      MDM   53 yo caucasian M w/rash. Please see HPI for details. On exam, pt in NAD. Febrile at 101.1 in triage and given tylenol. Now afebrile. VSS. Patient has several lesions on face and lower extremities. Some appear folliculitis-like with pustule with redness around. Other surgeries flat red lesions. No obvious lymphadenopathy. Patient does have right upper quadrant tenderness over his liver. Will obtain CBC, CMP, UA, and blood cultures.   White count of 17. Liver enzymes elevated. Will obtain RUQ [place]. With fever and white count patient started on IV clindamycin.   RUQ [place] no sign of cholecystitis. He'll be admitted to hospitalist  service for continued monitoring of cellulitis. Please see their notes for further details regarding remainder of hospital course.   Final diagnoses:  Cellulitis of other specified site  Chronic pain  Elevated LFTs    Pt was seen under the supervision of Dr. Vanita Panda.     Sherian Maroon, MD 05/10/14 [phone]

## 2014-05-09 NOTE — ED Notes (Signed)
Attempted report 

## 2014-05-09 NOTE — ED Notes (Signed)
MD Lockwood at bedside.  

## 2014-05-09 NOTE — Progress Notes (Signed)
ANTIBIOTIC CONSULT NOTE - INITIAL  Pharmacy Consult for Vancomycin + Zosyn Indication: Cellulitis  Allergies  Allergen Reactions  . Codeine Nausea And Vomiting  . Macrodantin [Nitrofurantoin Macrocrystal] Rash    Patient Measurements: Height: 6\' 1"  (185.4 cm) Weight: 197 lb (89.359 kg) IBW/kg (Calculated) : 79.9  Vital Signs: Temp: 98.5 F (36.9 C) (08/21 1752) Temp src: Oral (08/21 1752) BP: 125/70 mmHg (08/21 2010) Pulse Rate: 83 (08/21 2010) Intake/Output from previous day:   Intake/Output from this shift:    Labs:  Recent Labs  05/09/14 1438  WBC 16.9*  HGB 16.7  PLT 425*  CREATININE 1.17   Estimated Creatinine Clearance: 84.4 ml/min (by C-G formula based on Cr of 1.17). No results found for this basename: VANCOTROUGH, Corlis Leak, VANCORANDOM, GENTTROUGH, GENTPEAK, GENTRANDOM, TOBRATROUGH, TOBRAPEAK, TOBRARND, AMIKACINPEAK, AMIKACINTROU, AMIKACIN,  in the last 72 hours   Microbiology: Recent Results (from the past 720 hour(s))  CULTURE, BLOOD (ROUTINE X 2)     Status: None   Collection Time    05/04/14 12:40 PM      Result Value Ref Range Status   Specimen Description BLOOD RIGHT ARM   Final   Special Requests BOTTLES DRAWN AEROBIC AND ANAEROBIC 10CC   Final   Culture  Setup Time     Final   Value: 05/04/2014 19:01     Performed at [facility]   Culture     Final   Value:        BLOOD CULTURE RECEIVED NO GROWTH TO DATE CULTURE WILL BE HELD FOR 5 DAYS BEFORE ISSUING A FINAL NEGATIVE REPORT     Performed at [facility]   Report Status PENDING   Incomplete  CULTURE, BLOOD (ROUTINE X 2)     Status: None   Collection Time    05/04/14 12:55 PM      Result Value Ref Range Status   Specimen Description Blood   Final   Special Requests NONE   Final   Culture  Setup Time     Final   Value: 05/04/2014 19:01     Performed at [facility]   Culture     Final   Value:        BLOOD CULTURE RECEIVED NO GROWTH TO DATE CULTURE WILL BE HELD  FOR 5 DAYS BEFORE ISSUING A FINAL NEGATIVE REPORT     Performed at [facility]   Report Status PENDING   Incomplete    Medical History: Past Medical History  Diagnosis Date  . Chronic pain   . ADD (attention deficit disorder)     Assessment: Erik Strong who [facility] the [facility] on [facility] with c/o persistent L-sided hip pain and rash to face. The patient was diagnosed with cellulitis and sent home on doxycycline. The patient re-presented on 8/21 with persistent facial cellulitis and pharmacy has been consulted to empirically start Vancomycin + Zosyn. Wt: 89.4 kg, SCr 1.17, CrCl~75-80 ml/min (normalized)  The patient received a dose of Clindamycin 600 mg at 2000 in the [place].   Goal of Therapy:  Vancomycin trough level 10-15 mcg/ml  Plan:  1. Vancomycin 1g IV every 12 hours 2. Zosyn 3.375g IV every 8 hours (infused over 4 hours) 3. Will continue to follow renal function, culture results, LOT, and antibiotic de-escalation plans    Alycia Rossetti, PharmD, BCPS Clinical Pharmacist Pager: [phone] 05/09/2014 9:11 PM

## 2014-05-09 NOTE — ED Notes (Signed)
Pt here for skin infection to facial area. sts was seen here last week and dx with MRSA and cellulitis.

## 2014-05-10 ENCOUNTER — Encounter (HOSPITAL_COMMUNITY): Payer: Self-pay

## 2014-05-10 ENCOUNTER — Inpatient Hospital Stay (HOSPITAL_COMMUNITY): Payer: PRIVATE HEALTH INSURANCE

## 2014-05-10 DIAGNOSIS — M25552 Pain in left hip: Secondary | ICD-10-CM | POA: Diagnosis present

## 2014-05-10 DIAGNOSIS — M25559 Pain in unspecified hip: Secondary | ICD-10-CM

## 2014-05-10 DIAGNOSIS — L989 Disorder of the skin and subcutaneous tissue, unspecified: Secondary | ICD-10-CM

## 2014-05-10 DIAGNOSIS — F988 Other specified behavioral and emotional disorders with onset usually occurring in childhood and adolescence: Secondary | ICD-10-CM | POA: Diagnosis present

## 2014-05-10 DIAGNOSIS — F17210 Nicotine dependence, cigarettes, uncomplicated: Secondary | ICD-10-CM | POA: Diagnosis present

## 2014-05-10 DIAGNOSIS — R509 Fever, unspecified: Secondary | ICD-10-CM | POA: Diagnosis present

## 2014-05-10 LAB — CBC WITH DIFFERENTIAL/PLATELET
Basophils Absolute: 0.1 10*3/uL (ref 0.0–0.1)
Basophils Relative: 1 % (ref 0–1)
EOS ABS: 0 10*3/uL (ref 0.0–0.7)
Eosinophils Relative: 0 % (ref 0–5)
HCT: 43 % (ref 39.0–52.0)
HEMOGLOBIN: 14.2 g/dL (ref 13.0–17.0)
Lymphocytes Relative: 21 % (ref 12–46)
Lymphs Abs: 3 10*3/uL (ref 0.7–4.0)
MCH: 30.5 pg (ref 26.0–34.0)
MCHC: 33 g/dL (ref 30.0–36.0)
MCV: 92.5 fL (ref 78.0–100.0)
MONO ABS: 1.6 10*3/uL — AB (ref 0.1–1.0)
MONOS PCT: 11 % (ref 3–12)
Neutro Abs: 9.4 10*3/uL — ABNORMAL HIGH (ref 1.7–7.7)
Neutrophils Relative %: 67 % (ref 43–77)
Platelets: 420 10*3/uL — ABNORMAL HIGH (ref 150–400)
RBC: 4.65 MIL/uL (ref 4.22–5.81)
RDW: 13.4 % (ref 11.5–15.5)
WBC: 14 10*3/uL — ABNORMAL HIGH (ref 4.0–10.5)

## 2014-05-10 LAB — URINALYSIS, ROUTINE W REFLEX MICROSCOPIC
Bilirubin Urine: NEGATIVE
Glucose, UA: NEGATIVE mg/dL
Hgb urine dipstick: NEGATIVE
Ketones, ur: NEGATIVE mg/dL
LEUKOCYTES UA: NEGATIVE
NITRITE: NEGATIVE
PH: 6 (ref 5.0–8.0)
Protein, ur: NEGATIVE mg/dL
Specific Gravity, Urine: 1.017 (ref 1.005–1.030)
Urobilinogen, UA: 1 mg/dL (ref 0.0–1.0)

## 2014-05-10 LAB — HEPATITIS PANEL, ACUTE
HCV AB: NEGATIVE
Hep A IgM: NONREACTIVE
Hep B C IgM: NONREACTIVE
Hepatitis B Surface Ag: NEGATIVE

## 2014-05-10 LAB — COMPREHENSIVE METABOLIC PANEL
ALT: 229 U/L — ABNORMAL HIGH (ref 0–53)
AST: 123 U/L — AB (ref 0–37)
Albumin: 2.8 g/dL — ABNORMAL LOW (ref 3.5–5.2)
Alkaline Phosphatase: 166 U/L — ABNORMAL HIGH (ref 39–117)
Anion gap: 14 (ref 5–15)
BUN: 14 mg/dL (ref 6–23)
CALCIUM: 9.2 mg/dL (ref 8.4–10.5)
CO2: 25 meq/L (ref 19–32)
Chloride: 98 mEq/L (ref 96–112)
Creatinine, Ser: 1.35 mg/dL (ref 0.50–1.35)
GFR calc Af Amer: 69 mL/min — ABNORMAL LOW (ref >90)
GFR, EST NON AFRICAN AMERICAN: 59 mL/min — AB (ref >90)
Glucose, Bld: 102 mg/dL — ABNORMAL HIGH (ref 70–99)
Potassium: 4.7 mEq/L (ref 3.7–5.3)
Sodium: 137 mEq/L (ref 137–147)
TOTAL PROTEIN: 7.2 g/dL (ref 6.0–8.3)
Total Bilirubin: 0.7 mg/dL (ref 0.3–1.2)

## 2014-05-10 LAB — CULTURE, BLOOD (ROUTINE X 2)
CULTURE: NO GROWTH
Culture: NO GROWTH

## 2014-05-10 LAB — URIC ACID: URIC ACID, SERUM: 2.6 mg/dL — AB (ref 4.0–7.8)

## 2014-05-10 MED ORDER — IOHEXOL 300 MG/ML  SOLN
100.0000 mL | Freq: Once | INTRAMUSCULAR | Status: AC | PRN
Start: 2014-05-10 — End: 2014-05-10
  Administered 2014-05-10: 100 mL via INTRAVENOUS

## 2014-05-10 NOTE — Progress Notes (Addendum)
[facility] PROGRESS NOTE  Erik Strong TFT:[id number] DOB: 1961-03-06 DOA: 05/09/2014 PCP: No primary provider on file.  Assessment/Plan: Principal Problem:   Cellulitis Active Problems:   Elevated LFTs   Chronic pain    1. Cellulitis/folliculitis involving multiple areas of the body - at this time blood cultures have been obtained we will continue vancomycin and Zosyn. Closely follow progression of the skin lesion and will call  infectious disease -Dr Campbell/will need outpt dermatology consult if no improvement. Appears nontoxic but febrile last night. Patient states he has had tetanus toxoid injection within the last 5 years. Check  HIV ab, patient had multiple cats at home  2. Elevated LFTs - could be secondary to infectious process due to #1 or be reaction to his medication. Denies having had any excessive use of alcohol or Tylenol. Check acute hepatitis panel and Tylenol levels. Check INR. Sonogram of abdomen is showing multiple small gallbladder polyps versus non-shadowing stones. HIDA scan pending. Check lipase, Abdomen is benign on exam. 3. Left leg pain with previous history of ORIF -x-rays negative,. 4. Chronic pain and ADD - continue home medications. 5. Rule out diabetes 6. Acute renal failure likely prerenal, improved with IV hydration 7. Left ankle swelling-CT scan right ankle , uric acid level   Code Status: full Family Communication: family updated about patient's clinical progress Disposition Plan:  As above    Brief narrative: *Erik Strong is a 53 y.o. male with history of chronic pain and ADD noticed a boil in his left shoulder area 2 weeks ago and had gone to his PCP who had prescribed antibiotics for 5 days. Despite taking which patient's boils started to worsen and involved other areas. Patient also started having left hip and thigh pain and had come to the ER. In the ER patient was given pain medications and discharged on doxycycline. Patient has been  taking doxycycline last few days despite which his skin boils has getting worse with pain. Denies any oral lesions. Denies any nausea vomiting or abdominal pain or diarrhea. In the ER patient was found to be febrile. Blood cultures have been obtained and has been admitted for further management. Patient on his skin has multiple boils involving his face some on his trunk and also many on his lower extremity. Has had CAT scan is on his lower extremities. Does not recall having any definite insect bites or any recent travels  Consultants:  None  Procedures:  None  Antibiotics:  Vancomycin/Zosyn  HPI/Subjective: Denies any pain around the lesions or itching   Objective: Filed Vitals:   05/09/14 1800 05/09/14 2010 05/09/14 2100 05/10/14 0438  BP: 117/71 125/70 120/66 126/71  Pulse: 67 83 81 72  Temp:   101.6 F (38.7 C) 99.9 F (37.7 C)  TempSrc:   Oral Oral  Resp: 14 16 18 20   Height:   6\' 1"  (1.854 m)   Weight:   89.359 kg (197 lb)   SpO2: 96% 99% 100% 97%    Intake/Output Summary (Last 24 hours) at 05/10/14 1149 Last data filed at 05/10/14 0900  Gross per 24 hour  Intake    240 ml  Output   1000 ml  Net   -760 ml    Exam:  General: alert & oriented x 3 In NAD  Cardiovascular: RRR, nl S1 s2  Respiratory: Decreased breath sounds at the bases, scattered rhonchi, no crackles  Abdomen: soft +BS NT/ND, no masses palpable  Extremities: No cyanosis and no edema Skin  several lesions on face and lower extremities. Some appear folliculitis-like with pustule with redness around      Data Reviewed: Basic Metabolic Panel:  Recent Labs Lab 05/09/14 1438 05/09/14 2257 05/10/14 0550  NA 136*  --  137  K 4.5  --  4.7  CL 97  --  98  CO2 28  --  25  GLUCOSE 106*  --  102*  BUN 13  --  14  CREATININE 1.17 1.39* 1.35  CALCIUM 10.0  --  9.2    Liver Function Tests:  Recent Labs Lab 05/09/14 1438 05/10/14 0550  AST 108* 123*  ALT 224* 229*  ALKPHOS 157* 166*   BILITOT 0.4 0.7  PROT 8.1 7.2  ALBUMIN 3.1* 2.8*   No results found for this basename: LIPASE, AMYLASE,  in the last 168 hours No results found for this basename: AMMONIA,  in the last 168 hours  CBC:  Recent Labs Lab 05/04/14 1255 05/09/14 1438 05/09/14 2257 05/10/14 0550  WBC 13.2* 16.9* 16.0* 14.0*  NEUTROABS 8.1* 11.7*  --  9.4*  HGB 19.1* 16.7 14.8 14.2  HCT 55.6* 48.2 43.1 43.0  MCV 94.7 95.1 92.3 92.5  PLT 265 425* 426* 420*    Cardiac Enzymes: No results found for this basename: CKTOTAL, CKMB, CKMBINDEX, TROPONINI,  in the last 168 hours BNP (last 3 results) No results found for this basename: PROBNP,  in the last 8760 hours   CBG: No results found for this basename: GLUCAP,  in the last 168 hours  Recent Results (from the past 240 hour(s))  CULTURE, BLOOD (ROUTINE X 2)     Status: None   Collection Time    05/04/14 12:40 PM      Result Value Ref Range Status   Specimen Description BLOOD RIGHT ARM   Final   Special Requests BOTTLES DRAWN AEROBIC AND ANAEROBIC 10CC   Final   Culture  Setup Time     Final   Value: 05/04/2014 19:01     Performed at [facility]   Culture     Final   Value:        BLOOD CULTURE RECEIVED NO GROWTH TO DATE CULTURE WILL BE HELD FOR 5 DAYS BEFORE ISSUING A FINAL NEGATIVE REPORT     Performed at [facility]   Report Status PENDING   Incomplete  CULTURE, BLOOD (ROUTINE X 2)     Status: None   Collection Time    05/04/14 12:55 PM      Result Value Ref Range Status   Specimen Description Blood   Final   Special Requests NONE   Final   Culture  Setup Time     Final   Value: 05/04/2014 19:01     Performed at [facility]   Culture     Final   Value:        BLOOD CULTURE RECEIVED NO GROWTH TO DATE CULTURE WILL BE HELD FOR 5 DAYS BEFORE ISSUING A FINAL NEGATIVE REPORT     Performed at [facility]   Report Status PENDING   Incomplete     Studies: Dg Chest 1 View  05/09/2014   CLINICAL DATA:   53 year old male status post lower extremity Orthopedic surgery. MRA assay. Initial encounter.  EXAM: CHEST - 1 VIEW  COMPARISON:  None.  FINDINGS: Two supine views of the chest 2232 hrs. Normal cardiac size and mediastinal contours. Visualized tracheal air column is within normal limits. Allowing for portable  technique, the lungs are clear.  IMPRESSION: No acute cardiopulmonary abnormality.   Electronically Signed   By: Lars Pinks M.D.   On: 05/09/2014 22:46   Dg Pelvis 1-2 Views  05/09/2014   CLINICAL DATA:  Hip pain  EXAM: PELVIS - 1-2 VIEW  COMPARISON:  None.  FINDINGS: Status post open reduction internal fixation of an intertrochanteric left femur fracture. Visible portions of the fixation hardware are unremarkable.  There is no acute hip fracture or pelvic ring fracture.  Bones are osteopenic. Transitional lumbosacral anatomy with a pseudoarthrosis between left transverse process of the lowest lumbar vertebra and the sacrum.  IMPRESSION: 1.  No acute osseous findings. 2. Healed proximal left femur fracture status post ORIF.   Electronically Signed   By: Jorje Guild M.D.   On: 05/09/2014 23:27   Dg Femur Left  05/09/2014   CLINICAL DATA:  Distal leg pain  EXAM: LEFT FEMUR - 2 VIEW  COMPARISON:  None.  FINDINGS: Remote intertrochanteric or subtrochanteric left femur fracture status post intra medullary nail fixation with neck screw. The fracture is healed. No periprosthetic fracture.  IMPRESSION: Healed proximal left femur fracture status post ORIF. No acute osseous findings.   Electronically Signed   By: Jorje Guild M.D.   On: 05/09/2014 23:29   US Abdomen Limited Ruq  05/09/2014   CLINICAL DATA:  53 year old male with abdominal pain and leukocytosis. Initial encounter.  EXAM: US ABDOMEN LIMITED - RIGHT UPPER QUADRANT  COMPARISON:  None.  FINDINGS: Gallbladder:  Several 2-3 mm non shadowing echogenic foci within the gallbladder lumen could be small stones or inconsequential polyps (image 35).  Gallbladder wall thickness remains normal. No pericholecystic fluid. No sonographic Murphy sign elicited. No intrahepatic biliary ductal dilatation.  Common bile duct:  Diameter: 4 mm, normal.  Liver:  No focal lesion identified. Within normal limits in parenchymal echogenicity.  Other findings:  Negative visible right kidney.  IMPRESSION: 1. No evidence of acute cholecystitis. Multiple small (up to 3 mm) gallbladder polyps versus non shadowing stones. 2. Otherwise negative.   Electronically Signed   By: Lars Pinks M.D.   On: 05/09/2014 18:57    Scheduled Meds: . amphetamine-dextroamphetamine  15 mg Oral BID WC  . enoxaparin (LOVENOX) injection  40 mg Subcutaneous Q24H  . piperacillin-tazobactam (ZOSYN)  IV  3.375 g Intravenous 3 times per day  . vancomycin  1,000 mg Intravenous Q12H   Continuous Infusions: . sodium chloride 100 mL/hr at 05/09/14 2137    Principal Problem:   Cellulitis Active Problems:   Elevated LFTs   Chronic pain    Time spent: 40 minutes   [facility] Pager [phone]. If 7PM-7AM, please contact night-coverage at [email or web address], password [facility] 05/10/2014, 11:49 AM  LOS: 1 day

## 2014-05-10 NOTE — Consult Note (Addendum)
[facility]    Date of Admission:  05/09/2014           Day 2 vancomycin        Day 2 piperacillin tazobactam       Reason for Consult: Fever and rash    Referring Physician: Dr. Reyne Dumas Primary Care Physician: Dr. Donnie Coffin  Principal Problem:   Rash Active Problems:   Fever   Pain in left hip   Elevated LFTs   Chronic pain   Attention deficit disorder   Cigarette smoker   . amphetamine-dextroamphetamine  15 mg Oral BID WC  . enoxaparin (LOVENOX) injection  40 mg Subcutaneous Q24H  . piperacillin-tazobactam (ZOSYN)  IV  3.375 g Intravenous 3 times per day  . vancomycin  1,000 mg Intravenous Q12H    Recommendations: 1. Continue current antibiotics pending blood culture results and further observation   Assessment: Mr. Sanden has fever and unusual skin lesions that appear to have begun at the site of scratches and puncture wounds on his eyes from his kittens. I'm worried about disseminated infection. He is at risk for Pasteurella, Bartonella and Capnocytophagia. I agree with his current empiric antibiotic therapy pending results of blood cultures. It is not getting better I will culture some of the small vesicles and/or consider skin biopsy. He may also need imaging of his left hip given the recent acute left hip pain.   HPI: GEREMY Strong is a 53 y.o. male who developed a large, painful "boil" on his left upper back about 2 weeks ago. He mentioned this to Dr. Donnie Coffin during his routine visit and was prescribed an antibiotic that he took for 5 days. He is not sure of the name of the antibiotic. The boil spontaneously drained several days later. He recalls having received multiple scratches from his 3 small kittens about 3-4 weeks ago. He states that they were initially a little red but shortly after he developed a boil these areas became more inflamed. He also started to develop more disseminated lesions on his back, face and  around his ankles. He developed acute on chronic left hip pain and was seen in the emergency department on August 16. He was given a prescription for doxycycline and sent home. He has been having fever, chills and mild sweats. He's had no improvement in his hip pain and his rash was getting worse leading to his admission yesterday.   Review of Systems: Constitutional: positive for chills, fevers and sweats, negative for anorexia and weight loss Eyes: negative Ears, nose, mouth, throat, and face: negative Respiratory: negative Cardiovascular: negative Gastrointestinal: negative Genitourinary:negative Integument/breast: positive for rash  Past Medical History  Diagnosis Date  . Chronic pain   . ADD (attention deficit disorder)     History  Substance Use Topics  . Smoking status: Current Every Day Smoker    Types: Cigarettes  . Smokeless tobacco: Not on file  . Alcohol Use: No    Family History  Problem Relation Age of Onset  . Prostate cancer Father   . CAD Maternal Uncle    Allergies  Allergen Reactions  . Codeine Nausea And Vomiting  . Macrodantin [Nitrofurantoin Macrocrystal] Rash    OBJECTIVE: Blood pressure 126/71, pulse 72, temperature 99.9 F (37.7 C), temperature source Oral, resp. rate 20, height 6\' 1"  (1.854 m), weight 197 lb (89.359 kg), SpO2 97.00%. General: He is alert and in no distress. He is sitting  up in a chair Skin: He has multiple, raised, erythematous nodules on his forehead, eyelids, nose, cheeks and chin. A few have a very small central pustules. There are a few healing lesions on his upper back. There are multiple puncture wounds and scratches on his thighs from his kidneys. The areas are erythematous with some vesicles and pustules. He has a very tender erythematous nodule on his right lateral ankle and 2 tender, erythematous nodules on his left lower leg posteriorly Lungs: Clear Cor: Distant heart sounds Abdomen: Soft and nontender   Lab  Results Lab Results  Component Value Date   WBC 14.0* 05/10/2014   HGB 14.2 05/10/2014   HCT 43.0 05/10/2014   MCV 92.5 05/10/2014   PLT 420* 05/10/2014    Lab Results  Component Value Date   CREATININE 1.35 05/10/2014   BUN 14 05/10/2014   NA 137 05/10/2014   K 4.7 05/10/2014   CL 98 05/10/2014   CO2 25 05/10/2014    Lab Results  Component Value Date   ALT 229* 05/10/2014   AST 123* 05/10/2014   ALKPHOS 166* 05/10/2014   BILITOT 0.7 05/10/2014     Microbiology: Recent Results (from the past 240 hour(s))  CULTURE, BLOOD (ROUTINE X 2)     Status: None   Collection Time    05/04/14 12:40 PM      Result Value Ref Range Status   Specimen Description BLOOD RIGHT ARM   Final   Special Requests BOTTLES DRAWN AEROBIC AND ANAEROBIC 10CC   Final   Culture  Setup Time     Final   Value: 05/04/2014 19:01     Performed at [facility]   Culture     Final   Value: NO GROWTH 5 DAYS     Performed at [facility]   Report Status 05/10/2014 FINAL   Final  CULTURE, BLOOD (ROUTINE X 2)     Status: None   Collection Time    05/04/14 12:55 PM      Result Value Ref Range Status   Specimen Description Blood   Final   Special Requests NONE   Final   Culture  Setup Time     Final   Value: 05/04/2014 19:01     Performed at [facility]     Final   Value: NO GROWTH 5 DAYS     Performed at [facility]   Report Status 05/10/2014 FINAL   Final    Michel Bickers, MD [facility] [phone] pager   [phone] cell 05/10/2014, 1:54 PM

## 2014-05-11 ENCOUNTER — Inpatient Hospital Stay (HOSPITAL_COMMUNITY): Payer: PRIVATE HEALTH INSURANCE

## 2014-05-11 DIAGNOSIS — T07XXXA Unspecified multiple injuries, initial encounter: Secondary | ICD-10-CM

## 2014-05-11 DIAGNOSIS — X58XXXA Exposure to other specified factors, initial encounter: Secondary | ICD-10-CM

## 2014-05-11 LAB — LIPASE, BLOOD: Lipase: 28 U/L (ref 11–59)

## 2014-05-11 LAB — COMPREHENSIVE METABOLIC PANEL
ALBUMIN: 2.6 g/dL — AB (ref 3.5–5.2)
ALT: 240 U/L — AB (ref 0–53)
AST: 110 U/L — AB (ref 0–37)
Alkaline Phosphatase: 166 U/L — ABNORMAL HIGH (ref 39–117)
Anion gap: 11 (ref 5–15)
BILIRUBIN TOTAL: 0.7 mg/dL (ref 0.3–1.2)
BUN: 12 mg/dL (ref 6–23)
CHLORIDE: 97 meq/L (ref 96–112)
CO2: 27 mEq/L (ref 19–32)
CREATININE: 1.33 mg/dL (ref 0.50–1.35)
Calcium: 8.9 mg/dL (ref 8.4–10.5)
GFR calc Af Amer: 70 mL/min — ABNORMAL LOW (ref >90)
GFR calc non Af Amer: 60 mL/min — ABNORMAL LOW (ref >90)
Glucose, Bld: 110 mg/dL — ABNORMAL HIGH (ref 70–99)
Potassium: 4.3 mEq/L (ref 3.7–5.3)
SODIUM: 135 meq/L — AB (ref 137–147)
Total Protein: 6.9 g/dL (ref 6.0–8.3)

## 2014-05-11 LAB — HEMOGLOBIN A1C
Hgb A1c MFr Bld: 5.8 % — ABNORMAL HIGH (ref <5.7)
MEAN PLASMA GLUCOSE: 120 mg/dL — AB (ref <117)

## 2014-05-11 MED ORDER — TECHNETIUM TC 99M MEBROFENIN IV KIT
5.0000 | PACK | Freq: Once | INTRAVENOUS | Status: AC | PRN
  Administered 2014-05-11: 5 via INTRAVENOUS

## 2014-05-11 NOTE — Progress Notes (Signed)
Patient ID: Erik Strong, male   DOB: 04-Sep-1961, 53 y.o.   MRN: [id number]         [facility]    Date of Admission:  05/09/2014           Day 3 vancomycin        Day 3 piperacillin tazobactam  Principal Problem:   Rash Active Problems:   Fever   Pain in left hip   Elevated LFTs   Chronic pain   Attention deficit disorder   Cigarette smoker   . amphetamine-dextroamphetamine  15 mg Oral BID WC  . enoxaparin (LOVENOX) injection  40 mg Subcutaneous Q24H  . piperacillin-tazobactam (ZOSYN)  IV  3.375 g Intravenous 3 times per day  . vancomycin  1,000 mg Intravenous Q12H    Subjective: He is feeling a little bit better today.  Past Medical History  Diagnosis Date  . Chronic pain   . ADD (attention deficit disorder)     History  Substance Use Topics  . Smoking status: Current Every Day Smoker    Types: Cigarettes  . Smokeless tobacco: Not on file  . Alcohol Use: No    Family History  Problem Relation Age of Onset  . Prostate cancer Father   . CAD Maternal Uncle     Allergies  Allergen Reactions  . Codeine Nausea And Vomiting  . Macrodantin [Nitrofurantoin Macrocrystal] Rash    Objective: Temp:  [98.9 F (37.2 C)-100.2 F (37.9 C)] 98.9 F (37.2 C) (08/23 0612) Pulse Rate:  [68-77] 68 (08/23 0612) Resp:  [18] 18 (08/23 0612) BP: (107-133)/(62-70) 133/70 mmHg (08/23 0612) SpO2:  [96 %-100 %] 96 % (08/23 0612)  General: He is sitting up in a chair in no distress Skin: No new skin lesions are noted and old skin lesions appeared to be starting to dry up. The pain full erythematous nodules around his ankles are not as tender Lungs: Clear Cor: Regular S1 and S2 with no murmurs   Lab Results Lab Results  Component Value Date   WBC 14.0* 05/10/2014   HGB 14.2 05/10/2014   HCT 43.0 05/10/2014   MCV 92.5 05/10/2014   PLT 420* 05/10/2014    Lab Results  Component Value Date   CREATININE 1.33 05/11/2014   BUN 12 05/11/2014   NA  135* 05/11/2014   K 4.3 05/11/2014   CL 97 05/11/2014   CO2 27 05/11/2014    Lab Results  Component Value Date   ALT 240* 05/11/2014   AST 110* 05/11/2014   ALKPHOS 166* 05/11/2014   BILITOT 0.7 05/11/2014      Microbiology: Recent Results (from the past 240 hour(s))  CULTURE, BLOOD (ROUTINE X 2)     Status: None   Collection Time    05/04/14 12:40 PM      Result Value Ref Range Status   Specimen Description BLOOD RIGHT ARM   Final   Special Requests BOTTLES DRAWN AEROBIC AND ANAEROBIC 10CC   Final   Culture  Setup Time     Final   Value: 05/04/2014 19:01     Performed at [facility]   Culture     Final   Value: NO GROWTH 5 DAYS     Performed at [facility]   Report Status 05/10/2014 FINAL   Final  CULTURE, BLOOD (ROUTINE X 2)     Status: None   Collection Time    05/04/14 12:55 PM      Result  Value Ref Range Status   Specimen Description Blood   Final   Special Requests NONE   Final   Culture  Setup Time     Final   Value: 05/04/2014 19:01     Performed at [facility]   Culture     Final   Value: NO GROWTH 5 DAYS     Performed at [facility]   Report Status 05/10/2014 FINAL   Final  CULTURE, BLOOD (ROUTINE X 2)     Status: None   Collection Time    05/09/14  7:30 PM      Result Value Ref Range Status   Specimen Description BLOOD LEFT WRIST   Final   Special Requests BOTTLES DRAWN AEROBIC AND ANAEROBIC 5CC   Final   Culture  Setup Time     Final   Value: 05/10/2014 00:31     Performed at [facility]   Culture     Final   Value:        BLOOD CULTURE RECEIVED NO GROWTH TO DATE CULTURE WILL BE HELD FOR 5 DAYS BEFORE ISSUING A FINAL NEGATIVE REPORT     Performed at [facility]   Report Status PENDING   Incomplete  CULTURE, BLOOD (ROUTINE X 2)     Status: None   Collection Time    05/09/14  7:59 PM      Result Value Ref Range Status   Specimen Description BLOOD ARM LEFT   Final   Special Requests BOTTLES DRAWN AEROBIC  AND ANAEROBIC 10CC   Final   Culture  Setup Time     Final   Value: 05/10/2014 00:32     Performed at [facility]   Culture     Final   Value:        BLOOD CULTURE RECEIVED NO GROWTH TO DATE CULTURE WILL BE HELD FOR 5 DAYS BEFORE ISSUING A FINAL NEGATIVE REPORT     Performed at [facility]   Report Status PENDING   Incomplete    Studies/Results: Dg Chest 1 View  05/09/2014   CLINICAL DATA:  53 year old male status post lower extremity Orthopedic surgery. MRA assay. Initial encounter.  EXAM: CHEST - 1 VIEW  COMPARISON:  None.  FINDINGS: Two supine views of the chest 2232 hrs. Normal cardiac size and mediastinal contours. Visualized tracheal air column is within normal limits. Allowing for portable technique, the lungs are clear.  IMPRESSION: No acute cardiopulmonary abnormality.   Electronically Signed   By: Lars Pinks M.D.   On: 05/09/2014 22:46   Dg Pelvis 1-2 Views  05/09/2014   CLINICAL DATA:  Hip pain  EXAM: PELVIS - 1-2 VIEW  COMPARISON:  None.  FINDINGS: Status post open reduction internal fixation of an intertrochanteric left femur fracture. Visible portions of the fixation hardware are unremarkable.  There is no acute hip fracture or pelvic ring fracture.  Bones are osteopenic. Transitional lumbosacral anatomy with a pseudoarthrosis between left transverse process of the lowest lumbar vertebra and the sacrum.  IMPRESSION: 1.  No acute osseous findings. 2. Healed proximal left femur fracture status post ORIF.   Electronically Signed   By: Jorje Guild M.D.   On: 05/09/2014 23:27   Dg Femur Left  05/09/2014   CLINICAL DATA:  Distal leg pain  EXAM: LEFT FEMUR - 2 VIEW  COMPARISON:  None.  FINDINGS: Remote intertrochanteric or subtrochanteric left femur fracture status post intra medullary nail fixation with neck screw. The  fracture is healed. No periprosthetic fracture.  IMPRESSION: Healed proximal left femur fracture status post ORIF. No acute osseous findings.    Electronically Signed   By: Jorje Guild M.D.   On: 05/09/2014 23:29   Nm Hepatobiliary Liver Func  05/11/2014   CLINICAL DATA:  Abdominal pain, suspect cholecystitis, elevated hepatic function studies and fever  EXAM: NUCLEAR MEDICINE HEPATOBILIARY IMAGING  TECHNIQUE: Sequential images of the abdomen were obtained out to 60 minutes following intravenous administration of radiopharmaceutical.  RADIOPHARMACEUTICALS:  [employer] BV-69I Choletec  COMPARISON:  None  FINDINGS: There is adequate uptake of the radiopharmaceutical by the liver. Activity is visible within the common bile duct and bowel by 15 min. The gallbladder is visible by 35 min.  IMPRESSION: Normal hepatobiliary scan.   Electronically Signed   By: David  Martinique   On: 05/11/2014 09:44   Ct Ankle Right W Contrast  05/10/2014   CLINICAL DATA:  53 year old male with lower extremity soft tissue infection. Redness and swelling over the lateral malleolus. Initial encounter. Possible abscess.  EXAM: CT OF THE LEFT ANKLE WITH CONTRAST  TECHNIQUE: Multidetector CT imaging of the ankle was performed following the standard protocol during bolus administration of intravenous contrast.  CONTRAST:  18mL OMNIPAQUE IOHEXOL 300 MG/ML  SOLN  COMPARISON:  Left femur series 50388.  FINDINGS: Skin thickening and confluent subcutaneous fat stranding along the lateral aspect of the tib-fib, measuring up to 11 mm in thickness. Still, no fluid collection or abscess occurs. Underlying distal fibula intact. No cortical erosion. Distal left tibia intact. Mortise joint alignment preserved. No ankle joint effusion. Talus and calcaneus intact. Joint spaces within normal limits. There is also mild to moderate fat stranding throughout the plantar surface of the visible foot. No other soft tissue inflammation identified.  IMPRESSION: Confluent soft tissue inflammation compatible with cellulitis. No fluid collection, abscess, or under locking osseous changes.    Electronically Signed   By: Lars Pinks M.D.   On: 05/10/2014 19:40   US Abdomen Limited Ruq  05/09/2014   CLINICAL DATA:  53 year old male with abdominal pain and leukocytosis. Initial encounter.  EXAM: US ABDOMEN LIMITED - RIGHT UPPER QUADRANT  COMPARISON:  None.  FINDINGS: Gallbladder:  Several 2-3 mm non shadowing echogenic foci within the gallbladder lumen could be small stones or inconsequential polyps (image 35). Gallbladder wall thickness remains normal. No pericholecystic fluid. No sonographic Murphy sign elicited. No intrahepatic biliary ductal dilatation.  Common bile duct:  Diameter: 4 mm, normal.  Liver:  No focal lesion identified. Within normal limits in parenchymal echogenicity.  Other findings:  Negative visible right kidney.  IMPRESSION: 1. No evidence of acute cholecystitis. Multiple small (up to 3 mm) gallbladder polyps versus non shadowing stones. 2. Otherwise negative.   Electronically Signed   By: Lars Pinks M.D.   On: 05/09/2014 18:57    Assessment: He appears to be improving on empiric therapy for infection related to recent cat scratches. Blood cultures remain negative.  Plan: 1. Continue current antibiotics  Michel Bickers, MD [facility] [place] [phone] pager   [phone] cell 05/11/2014, 1:13 PM

## 2014-05-11 NOTE — Progress Notes (Signed)
[facility] PROGRESS NOTE  Erik Strong SWF:[id number] DOB: Sep 21, 1960 DOA: 05/09/2014 PCP: No primary provider on file.  Assessment/Plan: Principal Problem:   Rash Active Problems:   Elevated LFTs   Chronic pain   Fever   Pain in left hip   Attention deficit disorder   Cigarette smoker    1. Cellulitis/folliculitis involving multiple areas of the body - at this time blood cultures have been obtained we will continue vancomycin and Zosyn. Closely follow progression of the skin lesion and appreciate Dr Megan Salon consultation/will need outpt dermatology consult if no improvement. Appears nontoxic but febrile last night. Suspected Pasteurella, Bartonella,capnocytophagia. Check HIV ab, patient had multiple cats at home  2. Elevated LFTs - could be secondary to infectious process due to #1 or be reaction to his medication. Denies having had any excessive use of alcohol or Tylenol. Negative acute hepatitis panel and Tylenol levels. Check INR. Sonogram of abdomen is showing multiple small gallbladder polyps versus non-shadowing stones. HIDA negative. Check lipase, Abdomen is benign on exam. 3. Left leg pain with previous history of ORIF -x-rays negative,. 4. Chronic pain and ADD - continue home medications. 5. Rule out diabetes hemoglobin A1c 5.8 6. Acute renal failure likely prerenal, improved with IV hydration 7. Left ankle swelling-CT scan right ankle show cellulitis , uric acid level 2.6, doubt that the patient has gout 8.   Code Status: full Family Communication: family updated about patient's clinical progress Disposition Plan:  As above    Brief narrative: Erik Strong is a 53 y.o. male who developed a large, painful "boil" on his left upper back about 2 weeks ago. He mentioned this to Dr. Donnie Coffin during his routine visit and was prescribed an antibiotic that he took for 5 days. He is not sure of the name of the antibiotic. The boil spontaneously drained several days  later. He recalls having received multiple scratches from his 3 small kittens about 3-4 weeks ago. He states that they were initially a little red but shortly after he developed a boil these areas became more inflamed. He also started to develop more disseminated lesions on his back, face and around his ankles. He developed acute on chronic left hip pain and was seen in the emergency department on August 16. He was given a prescription for doxycycline and sent home. He has been having fever, chills and mild sweats. He's had no improvement in his hip pain and his rash was getting worse leading to his admission yesterday.   Consultants:  Infectious disease  Procedures:  None  Antibiotics:  Vancomycin and Zosyn  HPI/Subjective: Comfortable  Objective: Filed Vitals:   05/10/14 1415 05/10/14 2017 05/10/14 2141 05/11/14 0612  BP: 107/62  132/64 133/70  Pulse: 77  69 68  Temp: 99.2 F (37.3 C) 99.9 F (37.7 C) 100.2 F (37.9 C) 98.9 F (37.2 C)  TempSrc: Oral Oral Oral Oral  Resp: 18  18 18   Height:      Weight:      SpO2: 98%  100% 96%    Intake/Output Summary (Last 24 hours) at 05/11/14 1135 Last data filed at 05/11/14 0920  Gross per 24 hour  Intake   1380 ml  Output   3425 ml  Net  -2045 ml    Exam:  General: alert & oriented x 3 In NAD  Cardiovascular: RRR, nl S1 s2  Respiratory: Decreased breath sounds at the bases, scattered rhonchi, no crackles  Abdomen: soft +BS NT/ND, no masses palpable  Extremities: No cyanosis and no edema      Data Reviewed: Basic Metabolic Panel:  Recent Labs Lab 05/09/14 1438 05/09/14 2257 05/10/14 0550 05/11/14 0506  NA 136*  --  137 135*  K 4.5  --  4.7 4.3  CL 97  --  98 97  CO2 28  --  25 27  GLUCOSE 106*  --  102* 110*  BUN 13  --  14 12  CREATININE 1.17 1.39* 1.35 1.33  CALCIUM 10.0  --  9.2 8.9    Liver Function Tests:  Recent Labs Lab 05/09/14 1438 05/10/14 0550 05/11/14 0506  AST 108* 123* 110*  ALT  224* 229* 240*  ALKPHOS 157* 166* 166*  BILITOT 0.4 0.7 0.7  PROT 8.1 7.2 6.9  ALBUMIN 3.1* 2.8* 2.6*   No results found for this basename: LIPASE, AMYLASE,  in the last 168 hours No results found for this basename: AMMONIA,  in the last 168 hours  CBC:  Recent Labs Lab 05/04/14 1255 05/09/14 1438 05/09/14 2257 05/10/14 0550  WBC 13.2* 16.9* 16.0* 14.0*  NEUTROABS 8.1* 11.7*  --  9.4*  HGB 19.1* 16.7 14.8 14.2  HCT 55.6* 48.2 43.1 43.0  MCV 94.7 95.1 92.3 92.5  PLT 265 425* 426* 420*    Cardiac Enzymes: No results found for this basename: CKTOTAL, CKMB, CKMBINDEX, TROPONINI,  in the last 168 hours BNP (last 3 results) No results found for this basename: PROBNP,  in the last 8760 hours   CBG: No results found for this basename: GLUCAP,  in the last 168 hours  Recent Results (from the past 240 hour(s))  CULTURE, BLOOD (ROUTINE X 2)     Status: None   Collection Time    05/04/14 12:40 PM      Result Value Ref Range Status   Specimen Description BLOOD RIGHT ARM   Final   Special Requests BOTTLES DRAWN AEROBIC AND ANAEROBIC 10CC   Final   Culture  Setup Time     Final   Value: 05/04/2014 19:01     Performed at [facility]   Culture     Final   Value: NO GROWTH 5 DAYS     Performed at [facility]   Report Status 05/10/2014 FINAL   Final  CULTURE, BLOOD (ROUTINE X 2)     Status: None   Collection Time    05/04/14 12:55 PM      Result Value Ref Range Status   Specimen Description Blood   Final   Special Requests NONE   Final   Culture  Setup Time     Final   Value: 05/04/2014 19:01     Performed at [facility]   Culture     Final   Value: NO GROWTH 5 DAYS     Performed at [facility]   Report Status 05/10/2014 FINAL   Final     Studies: Dg Chest 1 View  05/09/2014   CLINICAL DATA:  53 year old male status post lower extremity Orthopedic surgery. MRA assay. Initial encounter.  EXAM: CHEST - 1 VIEW  COMPARISON:  None.   FINDINGS: Two supine views of the chest 2232 hrs. Normal cardiac size and mediastinal contours. Visualized tracheal air column is within normal limits. Allowing for portable technique, the lungs are clear.  IMPRESSION: No acute cardiopulmonary abnormality.   Electronically Signed   By: Lars Pinks M.D.   On: 05/09/2014 22:46   Dg Pelvis 1-2 Views  05/09/2014  CLINICAL DATA:  Hip pain  EXAM: PELVIS - 1-2 VIEW  COMPARISON:  None.  FINDINGS: Status post open reduction internal fixation of an intertrochanteric left femur fracture. Visible portions of the fixation hardware are unremarkable.  There is no acute hip fracture or pelvic ring fracture.  Bones are osteopenic. Transitional lumbosacral anatomy with a pseudoarthrosis between left transverse process of the lowest lumbar vertebra and the sacrum.  IMPRESSION: 1.  No acute osseous findings. 2. Healed proximal left femur fracture status post ORIF.   Electronically Signed   By: Jorje Guild M.D.   On: 05/09/2014 23:27   Dg Femur Left  05/09/2014   CLINICAL DATA:  Distal leg pain  EXAM: LEFT FEMUR - 2 VIEW  COMPARISON:  None.  FINDINGS: Remote intertrochanteric or subtrochanteric left femur fracture status post intra medullary nail fixation with neck screw. The fracture is healed. No periprosthetic fracture.  IMPRESSION: Healed proximal left femur fracture status post ORIF. No acute osseous findings.   Electronically Signed   By: Jorje Guild M.D.   On: 05/09/2014 23:29   Nm Hepatobiliary Liver Func  05/11/2014   CLINICAL DATA:  Abdominal pain, suspect cholecystitis, elevated hepatic function studies and fever  EXAM: NUCLEAR MEDICINE HEPATOBILIARY IMAGING  TECHNIQUE: Sequential images of the abdomen were obtained out to 60 minutes following intravenous administration of radiopharmaceutical.  RADIOPHARMACEUTICALS:  [employer] HD-62I Choletec  COMPARISON:  None  FINDINGS: There is adequate uptake of the radiopharmaceutical by the liver. Activity is  visible within the common bile duct and bowel by 15 min. The gallbladder is visible by 35 min.  IMPRESSION: Normal hepatobiliary scan.   Electronically Signed   By: David  Martinique   On: 05/11/2014 09:44   Ct Ankle Right W Contrast  05/10/2014   CLINICAL DATA:  53 year old male with lower extremity soft tissue infection. Redness and swelling over the lateral malleolus. Initial encounter. Possible abscess.  EXAM: CT OF THE LEFT ANKLE WITH CONTRAST  TECHNIQUE: Multidetector CT imaging of the ankle was performed following the standard protocol during bolus administration of intravenous contrast.  CONTRAST:  114mL OMNIPAQUE IOHEXOL 300 MG/ML  SOLN  COMPARISON:  Left femur series [id number].  FINDINGS: Skin thickening and confluent subcutaneous fat stranding along the lateral aspect of the tib-fib, measuring up to 11 mm in thickness. Still, no fluid collection or abscess occurs. Underlying distal fibula intact. No cortical erosion. Distal left tibia intact. Mortise joint alignment preserved. No ankle joint effusion. Talus and calcaneus intact. Joint spaces within normal limits. There is also mild to moderate fat stranding throughout the plantar surface of the visible foot. No other soft tissue inflammation identified.  IMPRESSION: Confluent soft tissue inflammation compatible with cellulitis. No fluid collection, abscess, or under locking osseous changes.   Electronically Signed   By: Lars Pinks M.D.   On: 05/10/2014 19:40   US Abdomen Limited Ruq  05/09/2014   CLINICAL DATA:  53 year old male with abdominal pain and leukocytosis. Initial encounter.  EXAM: US ABDOMEN LIMITED - RIGHT UPPER QUADRANT  COMPARISON:  None.  FINDINGS: Gallbladder:  Several 2-3 mm non shadowing echogenic foci within the gallbladder lumen could be small stones or inconsequential polyps (image 35). Gallbladder wall thickness remains normal. No pericholecystic fluid. No sonographic Murphy sign elicited. No intrahepatic biliary ductal dilatation.   Common bile duct:  Diameter: 4 mm, normal.  Liver:  No focal lesion identified. Within normal limits in parenchymal echogenicity.  Other findings:  Negative visible right kidney.  IMPRESSION: 1. No  evidence of acute cholecystitis. Multiple small (up to 3 mm) gallbladder polyps versus non shadowing stones. 2. Otherwise negative.   Electronically Signed   By: Lars Pinks M.D.   On: 05/09/2014 18:57    Scheduled Meds: . amphetamine-dextroamphetamine  15 mg Oral BID WC  . enoxaparin (LOVENOX) injection  40 mg Subcutaneous Q24H  . piperacillin-tazobactam (ZOSYN)  IV  3.375 g Intravenous 3 times per day  . vancomycin  1,000 mg Intravenous Q12H   Continuous Infusions:   Principal Problem:   Rash Active Problems:   Elevated LFTs   Chronic pain   Fever   Pain in left hip   Attention deficit disorder   Cigarette smoker    Time spent: 40 minutes   [facility] Pager [phone]. If 7PM-7AM, please contact night-coverage at [email or web address], password [email or web address] 05/11/2014, 11:35 AM  LOS: 2 days

## 2014-05-11 NOTE — ED Provider Notes (Signed)
This patient was seen in conjunction with the resident physician.  The documentation accurately reflects the patient's ED evaluation.  On my exam, patient was awake alert, was clear evidence for facial cellulitis. Patient had a patent airway, and his exam was is notable for mild right upper quadrant tenderness, but this did not eventually resulted in evaluation consistent with this cholecystitis Given the patient's fever, concern for cellulitis he was admitted after initiation of antibiotics.   Carmin Muskrat, MD 05/11/14 [phone]

## 2014-05-12 LAB — HIV-1 RNA QUANT-NO REFLEX-BLD: HIV 1 RNA Quant: <20 copies/mL (ref <20)

## 2014-05-12 MED ORDER — SULFAMETHOXAZOLE-TRIMETHOPRIM 800-160 MG PO TABS: 1.0000 | ORAL_TABLET | Freq: Two times a day (BID) | ORAL | Status: AC

## 2014-05-12 MED ORDER — AMOXICILLIN 500 MG PO TABS: 500.0000 mg | ORAL_TABLET | Freq: Two times a day (BID) | ORAL | Status: AC

## 2014-05-12 MED ORDER — SULFAMETHOXAZOLE-TRIMETHOPRIM 400-80 MG PO TABS: 1.0000 | ORAL_TABLET | Freq: Two times a day (BID) | ORAL | Status: DC

## 2014-05-12 NOTE — Progress Notes (Signed)
Discharge to home. Pt. Discharge instruction given to patient.  No question verbalized.

## 2014-05-12 NOTE — Discharge Summary (Signed)
Physician Discharge Summary  Erik Strong MRN: [id number] DOB/AGE: [birth year] 52 y.o.  PCP: No primary provider on file.   Admit date: 05/09/2014 Discharge date: 05/12/2014  Discharge Diagnoses:    Disseminated folliculitis Active Problems:   Elevated LFTs   Chronic pain   Fever   Pain in left hip   Attention deficit disorder   Cigarette smoker  Follow up recommendations Follow up with infectious disease in one week Follow up with PCP in 1 week Repeat CBC and CMP in 1 week    Medication List    STOP taking these medications       doxycycline 100 MG capsule  Commonly known as:  VIBRAMYCIN      TAKE these medications       acetaminophen 650 MG CR tablet  Commonly known as:  TYLENOL  Take 650 mg by mouth every 8 (eight) hours as needed for pain.     ALPRAZolam 0.5 MG tablet  Commonly known as:  XANAX  Take 0.5 mg by mouth at bedtime as needed for anxiety.     amoxicillin 500 MG tablet  Commonly known as:  AMOXIL  Take 1 tablet (500 mg total) by mouth 2 (two) times daily.     amphetamine-dextroamphetamine 30 MG tablet  Commonly known as:  ADDERALL  Take 15 mg by mouth 2 (two) times daily.     oxyCODONE-acetaminophen 7.5-325 MG per tablet  Commonly known as:  PERCOCET  Take 1 tablet by mouth every 4 (four) hours as needed for pain.     sulfamethoxazole-trimethoprim 800-160 MG per tablet  Commonly known as:  SEPTRA DS  Take 1 tablet by mouth 2 (two) times daily.     tiZANidine 2 MG tablet  Commonly known as:  ZANAFLEX  Take 2 mg by mouth every 6 (six) hours as needed for muscle spasms.     traMADol 50 MG tablet  Commonly known as:  ULTRAM  Take 50 mg by mouth every 6 (six) hours as needed for moderate pain or severe pain.        Discharge Condition: Stable  Disposition: 01-Home or Self Care   Consults:  Infectious disease  Significant Diagnostic Studies: Dg Chest 1 View  05/09/2014   CLINICAL DATA:  53 year old male status post lower  extremity Orthopedic surgery. MRA assay. Initial encounter.  EXAM: CHEST - 1 VIEW  COMPARISON:  None.  FINDINGS: Two supine views of the chest 2232 hrs. Normal cardiac size and mediastinal contours. Visualized tracheal air column is within normal limits. Allowing for portable technique, the lungs are clear.  IMPRESSION: No acute cardiopulmonary abnormality.   Electronically Signed   By: Lars Pinks M.D.   On: 05/09/2014 22:46   Dg Pelvis 1-2 Views  05/09/2014   CLINICAL DATA:  Hip pain  EXAM: PELVIS - 1-2 VIEW  COMPARISON:  None.  FINDINGS: Status post open reduction internal fixation of an intertrochanteric left femur fracture. Visible portions of the fixation hardware are unremarkable.  There is no acute hip fracture or pelvic ring fracture.  Bones are osteopenic. Transitional lumbosacral anatomy with a pseudoarthrosis between left transverse process of the lowest lumbar vertebra and the sacrum.  IMPRESSION: 1.  No acute osseous findings. 2. Healed proximal left femur fracture status post ORIF.   Electronically Signed   By: Jorje Guild M.D.   On: 05/09/2014 23:27   Dg Hip Complete Left  05/11/2014   CLINICAL DATA:  Pain  EXAM: LEFT HIP -  COMPLETE 2+ VIEW  COMPARISON:  May 09, 2014  FINDINGS: Frontal pelvis as well as frontal and lateral left hip images were obtained. There is postoperative change in the proximal left femur with remodeling from prior trauma. The tip of the screw with its in the proximal left femoral head.  No acute fracture or dislocation.  Joint spaces appear intact.  IMPRESSION: Postoperative change on the left with remodeling. Prior fracture in the intertrochanteric region has healed. No acute fracture or dislocation. Joint spaces appear intact. No erosive change.   Electronically Signed   By: Lowella Grip M.D.   On: 05/11/2014 17:36   Dg Femur Left  05/09/2014   CLINICAL DATA:  Distal leg pain  EXAM: LEFT FEMUR - 2 VIEW  COMPARISON:  None.  FINDINGS: Remote intertrochanteric  or subtrochanteric left femur fracture status post intra medullary nail fixation with neck screw. The fracture is healed. No periprosthetic fracture.  IMPRESSION: Healed proximal left femur fracture status post ORIF. No acute osseous findings.   Electronically Signed   By: Jorje Guild M.D.   On: 05/09/2014 23:29   Nm Hepatobiliary Liver Func  05/11/2014   CLINICAL DATA:  Abdominal pain, suspect cholecystitis, elevated hepatic function studies and fever  EXAM: NUCLEAR MEDICINE HEPATOBILIARY IMAGING  TECHNIQUE: Sequential images of the abdomen were obtained out to 60 minutes following intravenous administration of radiopharmaceutical.  RADIOPHARMACEUTICALS:  [employer] [place] QM-57Q Choletec  COMPARISON:  None  FINDINGS: There is adequate uptake of the radiopharmaceutical by the liver. Activity is visible within the common bile duct and bowel by 15 min. The gallbladder is visible by 35 min.  IMPRESSION: Normal hepatobiliary scan.   Electronically Signed   By: David  Martinique   On: 05/11/2014 09:44   Ct Ankle Right W Contrast  05/10/2014   CLINICAL DATA:  53 year old male with lower extremity soft tissue infection. Redness and swelling over the lateral malleolus. Initial encounter. Possible abscess.  EXAM: CT OF THE LEFT ANKLE WITH CONTRAST  TECHNIQUE: Multidetector CT imaging of the ankle was performed following the standard protocol during bolus administration of intravenous contrast.  CONTRAST:  134mL OMNIPAQUE IOHEXOL 300 MG/ML  SOLN  COMPARISON:  Left femur series 46962.  FINDINGS: Skin thickening and confluent subcutaneous fat stranding along the lateral aspect of the tib-fib, measuring up to 11 mm in thickness. Still, no fluid collection or abscess occurs. Underlying distal fibula intact. No cortical erosion. Distal left tibia intact. Mortise joint alignment preserved. No ankle joint effusion. Talus and calcaneus intact. Joint spaces within normal limits. There is also mild to moderate fat stranding  throughout the plantar surface of the visible foot. No other soft tissue inflammation identified.  IMPRESSION: Confluent soft tissue inflammation compatible with cellulitis. No fluid collection, abscess, or under locking osseous changes.   Electronically Signed   By: Lars Pinks M.D.   On: 05/10/2014 19:40   US Abdomen Limited Ruq  05/09/2014   CLINICAL DATA:  53 year old male with abdominal pain and leukocytosis. Initial encounter.  EXAM: US ABDOMEN LIMITED - RIGHT UPPER QUADRANT  COMPARISON:  None.  FINDINGS: Gallbladder:  Several 2-3 mm non shadowing echogenic foci within the gallbladder lumen could be small stones or inconsequential polyps (image 35). Gallbladder wall thickness remains normal. No pericholecystic fluid. No sonographic Murphy sign elicited. No intrahepatic biliary ductal dilatation.  Common bile duct:  Diameter: 4 mm, normal.  Liver:  No focal lesion identified. Within normal limits in parenchymal echogenicity.  Other findings:  Negative visible right kidney.  IMPRESSION: 1. No evidence of acute cholecystitis. Multiple small (up to 3 mm) gallbladder polyps versus non shadowing stones. 2. Otherwise negative.   Electronically Signed   By: Lars Pinks M.D.   On: 05/09/2014 18:57       Microbiology: Recent Results (from the past 240 hour(s))  CULTURE, BLOOD (ROUTINE X 2)     Status: None   Collection Time    05/04/14 12:40 PM      Result Value Ref Range Status   Specimen Description BLOOD RIGHT ARM   Final   Special Requests BOTTLES DRAWN AEROBIC AND ANAEROBIC 10CC   Final   Culture  Setup Time     Final   Value: 05/04/2014 19:01     Performed at [facility]   Culture     Final   Value: NO GROWTH 5 DAYS     Performed at [facility]   Report Status 05/10/2014 FINAL   Final  CULTURE, BLOOD (ROUTINE X 2)     Status: None   Collection Time    05/04/14 12:55 PM      Result Value Ref Range Status   Specimen Description Blood   Final   Special Requests NONE   Final    Culture  Setup Time     Final   Value: 05/04/2014 19:01     Performed at [facility]   Culture     Final   Value: NO GROWTH 5 DAYS     Performed at [facility]   Report Status 05/10/2014 FINAL   Final  CULTURE, BLOOD (ROUTINE X 2)     Status: None   Collection Time    05/09/14  7:30 PM      Result Value Ref Range Status   Specimen Description BLOOD LEFT WRIST   Final   Special Requests BOTTLES DRAWN AEROBIC AND ANAEROBIC 5CC   Final   Culture  Setup Time     Final   Value: 05/10/2014 00:31     Performed at [facility]   Culture     Final   Value:        BLOOD CULTURE RECEIVED NO GROWTH TO DATE CULTURE WILL BE HELD FOR 5 DAYS BEFORE ISSUING A FINAL NEGATIVE REPORT     Performed at [facility]   Report Status PENDING   Incomplete  CULTURE, BLOOD (ROUTINE X 2)     Status: None   Collection Time    05/09/14  7:59 PM      Result Value Ref Range Status   Specimen Description BLOOD ARM LEFT   Final   Special Requests BOTTLES DRAWN AEROBIC AND ANAEROBIC 10CC   Final   Culture  Setup Time     Final   Value: 05/10/2014 00:32     Performed at [facility]   Culture     Final   Value:        BLOOD CULTURE RECEIVED NO GROWTH TO DATE CULTURE WILL BE HELD FOR 5 DAYS BEFORE ISSUING A FINAL NEGATIVE REPORT     Performed at [facility]   Report Status PENDING   Incomplete     Labs: Results for orders placed during the hospital encounter of 05/09/14 (from the past 48 hour(s))  COMPREHENSIVE METABOLIC PANEL     Status: Abnormal   Collection Time    05/11/14  5:06 AM      Result Value Ref Range   Sodium 135 (*) 137 -  147 mEq/L   Potassium 4.3  3.7 - 5.3 mEq/L   Chloride 97  96 - 112 mEq/L   CO2 27  19 - 32 mEq/L   Glucose, Bld 110 (*) 70 - 99 mg/dL   BUN 12  6 - 23 mg/dL   Creatinine, Ser 1.33  0.50 - 1.35 mg/dL   Calcium 8.9  8.4 - 10.5 mg/dL   Total Protein 6.9  6.0 - 8.3 g/dL   Albumin 2.6 (*) 3.5 - 5.2 g/dL   AST 110 (*) 0 - 37  U/L   ALT 240 (*) 0 - 53 U/L   Alkaline Phosphatase 166 (*) 39 - 117 U/L   Total Bilirubin 0.7  0.3 - 1.2 mg/dL   GFR calc non Af Amer 60 (*) >90 mL/min   GFR calc Af Amer 70 (*) >90 mL/min   Comment: (NOTE)     The eGFR has been calculated using the CKD EPI equation.     This calculation has not been validated in all clinical situations.     eGFR's persistently <90 mL/min signify possible Chronic Kidney     Disease.   Anion gap 11  5 - 15  LIPASE, BLOOD     Status: None   Collection Time    05/11/14  5:06 AM      Result Value Ref Range   Lipase 28  11 - 59 U/L     HPI :Erik Strong is a 53 y.o. male who developed a large, painful "boil" on his left upper back about 2 weeks ago. He mentioned this to Dr. Donnie Coffin during his routine visit and was prescribed an antibiotic that he took for 5 days. He is not sure of the name of the antibiotic. The boil spontaneously drained several days later. He recalls having received multiple scratches from his 3 small kittens about 3-4 weeks ago. He states that they were initially a little red but shortly after he developed a boil these areas became more inflamed. He also started to develop more disseminated lesions on his back, face and around his ankles. He developed acute on chronic left hip pain and was seen in the emergency department on August 16. He was given a prescription for doxycycline and sent home. He has been having fever, chills and mild sweats. He's had no improvement in his hip pain and his rash was getting worse leading to his admission yesterday.   HOSPITAL COURSE:  1. Multiple areas of folliculitis -initiated on vancomycin and Zosyn. Blood cultures negative so far, consulted infectious disease Dr Megan Salon he recommended to continue with IV antibiotics, will need outpt dermatology consult if no improvement. Appears nontoxic but febrile prior to discharge. Suspected Pasteurella, Bartonella,capnocytophagia., HIV ab, all pending at this  time patient had multiple cats at home , infectious disease recommends to switch to Bactrim and amoxicillin for another 7 days. The patient to follow up with infectious disease Dr. Scharlene Gloss in his office in one week 2. Elevated LFTs - could be secondary to infectious process due to #1 or be reaction to his medication. Denies having had any excessive use of alcohol or Tylenol. Negative acute hepatitis panel and Tylenol levels. INR within normal limits. Sonogram of abdomen is showing multiple small gallbladder polyps versus non-shadowing stones. HIDA negative. Negative lipase, Abdomen is benign on exam. 3. Left leg pain with previous history of ORIF -x-rays negative,. 4. Chronic pain and ADD - continue home medications. 5. Rule out diabetes hemoglobin A1c 5.8  6. Acute renal failure likely prerenal, improved with IV hydration 7. Left ankle swelling-CT scan right ankle show cellulitis , uric acid level 2.6, doubt that the patient has gout    Discharge Exam: Blood pressure 122/74, pulse 67, temperature 98.3 F (36.8 C), temperature source Oral, resp. rate 18, height $RemoveBe'6\' 1"'fYlPXPiDg$  (1.854 m), weight 89.359 kg (197 lb), SpO2 99.00%.  General: He is alert and in no distress. He is sitting up in a chair  Skin: He has multiple, raised, erythematous nodules on his forehead, eyelids, nose, cheeks and chin. A few have a very small central pustules. There are a few healing lesions on his upper back. There are multiple puncture wounds and scratches on his thighs from his kidneys. The areas are erythematous with some vesicles and pustules. He has a very tender erythematous nodule on his right lateral ankle and 2 tender, erythematous nodules on his left lower leg posteriorly  Lungs: Clear  Cor: Distant heart sounds  Abdomen: Soft and nontender          Follow-up Information   Follow up with PCP. Schedule an appointment as soon as possible for a visit in 1 week.      Follow up with Dermatology. Schedule an  appointment as soon as possible for a visit in 2 weeks. (For Skin biopsy if no improvement on by mouth antibiotics)       Follow up with Scharlene Gloss, MD. Schedule an appointment as soon as possible for a visit in 1 week.   Specialty:  Infectious Diseases   Contact information:   [address] [place] [address] [phone]       Signed: Reyne Dumas 05/12/2014, 1:48 PM

## 2014-05-16 LAB — CULTURE, BLOOD (ROUTINE X 2)
Culture: NO GROWTH
Culture: NO GROWTH

## 2014-05-20 ENCOUNTER — Encounter: Payer: Self-pay | Admitting: Internal Medicine

## 2014-05-20 ENCOUNTER — Ambulatory Visit (INDEPENDENT_AMBULATORY_CARE_PROVIDER_SITE_OTHER): Payer: 59 | Admitting: Internal Medicine

## 2014-05-20 VITALS — Temp 98.2°F | Wt 196.0 lb

## 2014-05-20 DIAGNOSIS — L738 Other specified follicular disorders: Secondary | ICD-10-CM

## 2014-05-20 DIAGNOSIS — L739 Follicular disorder, unspecified: Secondary | ICD-10-CM

## 2014-05-20 LAB — CBC WITH DIFFERENTIAL/PLATELET
BASOS ABS: 0.1 10*3/uL (ref 0.0–0.1)
BASOS PCT: 1 % (ref 0–1)
Eosinophils Absolute: 0.1 10*3/uL (ref 0.0–0.7)
Eosinophils Relative: 1 % (ref 0–5)
HCT: 43.3 % (ref 39.0–52.0)
Hemoglobin: 15 g/dL (ref 13.0–17.0)
LYMPHS PCT: 40 % (ref 12–46)
Lymphs Abs: 4.2 10*3/uL — ABNORMAL HIGH (ref 0.7–4.0)
MCH: 31.1 pg (ref 26.0–34.0)
MCHC: 34.6 g/dL (ref 30.0–36.0)
MCV: 89.8 fL (ref 78.0–100.0)
Monocytes Absolute: 0.7 10*3/uL (ref 0.1–1.0)
Monocytes Relative: 7 % (ref 3–12)
NEUTROS ABS: 5.4 10*3/uL (ref 1.7–7.7)
NEUTROS PCT: 51 % (ref 43–77)
Platelets: 775 10*3/uL — ABNORMAL HIGH (ref 150–400)
RBC: 4.82 MIL/uL (ref 4.22–5.81)
RDW: 13.6 % (ref 11.5–15.5)
WBC: 10.5 10*3/uL (ref 4.0–10.5)

## 2014-05-20 LAB — BARTONELLA ANITBODY PANEL

## 2014-05-20 LAB — COMPREHENSIVE METABOLIC PANEL
ALBUMIN: 3.6 g/dL (ref 3.5–5.2)
ALT: 82 U/L — ABNORMAL HIGH (ref 0–53)
AST: 31 U/L (ref 0–37)
Alkaline Phosphatase: 106 U/L (ref 39–117)
BUN: 12 mg/dL (ref 6–23)
CO2: 25 mEq/L (ref 19–32)
Calcium: 9.4 mg/dL (ref 8.4–10.5)
Chloride: 102 mEq/L (ref 96–112)
Creat: 1.51 mg/dL — ABNORMAL HIGH (ref 0.50–1.35)
Glucose, Bld: 98 mg/dL (ref 70–99)
POTASSIUM: 4.9 meq/L (ref 3.5–5.3)
SODIUM: 136 meq/L (ref 135–145)
TOTAL PROTEIN: 7 g/dL (ref 6.0–8.3)
Total Bilirubin: 0.3 mg/dL (ref 0.2–1.2)

## 2014-05-20 LAB — BARTONELLA ANTIBODY PANEL

## 2014-05-20 MED ORDER — SULFAMETHOXAZOLE-TMP DS 800-160 MG PO TABS: 1.0000 | ORAL_TABLET | Freq: Every day | ORAL | Status: DC

## 2014-05-20 NOTE — Progress Notes (Signed)
Subjective:    Patient ID: Erik Strong, male    DOB: [birth year], 53 y.o.   MRN: [id number]  HPI  53yo M who was recently discharged from the hospital on 8/24 where he was admitted for multifocal folliculitis affecting face, forehead, back and legs bilaterally. He has exposure to cat scratch as well as swimming on pool that had untreated water. He was discharged on bactrim and amoxicillin which he was doing well. Skin and leg lesions resolved. He still has 2-3 lesion on rights die of back that still have pimple appearance with surrounding erythema. Infectious work up negative. He states that his lesions are much improved. He denies any recent fever or chills or nightsweats.  Current Outpatient Prescriptions on File Prior to Visit  Medication Sig Dispense Refill  . acetaminophen (TYLENOL) 650 MG CR tablet Take 650 mg by mouth every 8 (eight) hours as needed for pain.      Marland Kitchen ALPRAZolam (XANAX) 0.5 MG tablet Take 0.5 mg by mouth at bedtime as needed for anxiety.      Marland Kitchen amphetamine-dextroamphetamine (ADDERALL) 30 MG tablet Take 15 mg by mouth 2 (two) times daily.       Marland Kitchen oxyCODONE-acetaminophen (PERCOCET) 7.5-325 MG per tablet Take 1 tablet by mouth every 4 (four) hours as needed for pain.  15 tablet  0  . tiZANidine (ZANAFLEX) 2 MG tablet Take 2 mg by mouth every 6 (six) hours as needed for muscle spasms.      . traMADol (ULTRAM) 50 MG tablet Take 50 mg by mouth every 6 (six) hours as needed for moderate pain or severe pain.       No current facility-administered medications on file prior to visit.   Active Ambulatory Problems    Diagnosis Date Noted  . Rash 05/09/2014  . Elevated LFTs 05/09/2014  . Chronic pain 05/09/2014  . Fever 05/10/2014  . Pain in left hip 05/10/2014  . Attention deficit disorder 05/10/2014  . Cigarette smoker 05/10/2014   Resolved Ambulatory Problems    Diagnosis Date Noted  . No Resolved Ambulatory Problems   Past Medical History  Diagnosis Date  . ADD  (attention deficit disorder)    History  Substance Use Topics  . Smoking status: Current Every Day Smoker    Types: Cigarettes  . Smokeless tobacco: Not on file  . Alcohol Use: No  family history includes CAD in his maternal uncle; Prostate cancer in his father.  Review of Systems 10 point ROS negative    Objective:   Physical Exam Temp(Src) 98.2 F (36.8 C) (Oral)  Wt 196 lb (88.905 kg) Physical Exam  Constitutional: He is oriented to person, place, and time. He appears well-developed and well-nourished. No distress.  HENT:  Mouth/Throat: Oropharynx is clear and moist. No oropharyngeal exudate.  Cardiovascular: Normal rate, regular rhythm and normal heart sounds. Exam reveals no gallop and no friction rub.  No murmur heard.  Pulmonary/Chest: Effort normal and breath sounds normal. No respiratory distress. He has no wheezes.  Abdominal: Soft. Bowel sounds are normal. He exhibits no distension. There is no tenderness.  Lymphadenopathy:  He has no cervical adenopathy.  Neurological: He is alert and oriented to person, place, and time.  Skin: Skin lesions on face have resolved. Scattered 3 lesions, papules/furuncles to back Psychiatric: He has a normal mood and affect. His behavior is normal.   Lab Results  Component Value Date   ESRSEDRATE 40* 05/04/2014        Assessment & Plan:  will give 5 additional days of bactrim to add to the 2 days to finish out a week of treatment for remaining furuncle/folliculitis  Will check bartonella, cbc and cmp to ensure labs have normalize

## 2014-12-03 ENCOUNTER — Emergency Department (HOSPITAL_COMMUNITY): Payer: Medicare Other

## 2014-12-03 ENCOUNTER — Encounter (HOSPITAL_COMMUNITY): Payer: Self-pay | Admitting: *Deleted

## 2014-12-03 ENCOUNTER — Emergency Department (HOSPITAL_COMMUNITY)
Admission: EM | Admit: 2014-12-03 | Discharge: 2014-12-03 | Disposition: A | Discharge status: Home / Self Care | Payer: Medicare Other | Attending: Emergency Medicine | Admitting: Emergency Medicine

## 2014-12-03 DIAGNOSIS — Y9389 Activity, other specified: Secondary | ICD-10-CM | POA: Diagnosis not present

## 2014-12-03 DIAGNOSIS — Y998 Other external cause status: Secondary | ICD-10-CM | POA: Insufficient documentation

## 2014-12-03 DIAGNOSIS — Z792 Long term (current) use of antibiotics: Secondary | ICD-10-CM | POA: Diagnosis not present

## 2014-12-03 DIAGNOSIS — W540XXA Bitten by dog, initial encounter: Secondary | ICD-10-CM | POA: Diagnosis not present

## 2014-12-03 DIAGNOSIS — G8929 Other chronic pain: Secondary | ICD-10-CM | POA: Insufficient documentation

## 2014-12-03 DIAGNOSIS — Y9289 Other specified places as the place of occurrence of the external cause: Secondary | ICD-10-CM | POA: Diagnosis not present

## 2014-12-03 DIAGNOSIS — S41152A Open bite of left upper arm, initial encounter: Secondary | ICD-10-CM | POA: Diagnosis present

## 2014-12-03 DIAGNOSIS — Z23 Encounter for immunization: Secondary | ICD-10-CM | POA: Diagnosis not present

## 2014-12-03 DIAGNOSIS — F909 Attention-deficit hyperactivity disorder, unspecified type: Secondary | ICD-10-CM | POA: Diagnosis not present

## 2014-12-03 DIAGNOSIS — Z720 Tobacco use: Secondary | ICD-10-CM | POA: Diagnosis not present

## 2014-12-03 DIAGNOSIS — Z79899 Other long term (current) drug therapy: Secondary | ICD-10-CM | POA: Diagnosis not present

## 2014-12-03 MED ORDER — OXYCODONE-ACETAMINOPHEN 5-325 MG PO TABS: 1.0000 | ORAL_TABLET | ORAL | Status: DC | PRN

## 2014-12-03 MED ORDER — AMOXICILLIN-POT CLAVULANATE 875-125 MG PO TABS: 1.0000 | ORAL_TABLET | Freq: Two times a day (BID) | ORAL | Status: DC

## 2014-12-03 MED ORDER — IBUPROFEN 400 MG PO TABS
800.0000 mg | ORAL_TABLET | Freq: Once | ORAL | Status: AC
  Administered 2014-12-03: 800 mg via ORAL
  Filled 2014-12-03: qty 2

## 2014-12-03 MED ORDER — TETANUS-DIPHTH-ACELL PERTUSSIS 5-2.5-18.5 LF-MCG/0.5 IM SUSP
0.5000 mL | Freq: Once | INTRAMUSCULAR | Status: AC
  Administered 2014-12-03: 0.5 mL via INTRAMUSCULAR
  Filled 2014-12-03: qty 0.5

## 2014-12-03 NOTE — ED Notes (Signed)
Pt reports dog bite to left upper arm that occurred last night. Reports it is neighbors dog and they think dog is current on shots.

## 2014-12-03 NOTE — ED Provider Notes (Signed)
CSN: [id number]     Arrival date & time 12/03/14  1112 History  This chart was scribed for non-physician practitioner, Clayton Bibles, PA-C working with Orpah Greek, MD, by Ian Bushman, ED Scribe. This patient was seen in room TR10C/TR10C and the patient's care was started at 12:31 PM.   First MD Initiated Contact with Patient 12/03/14 1142     Chief Complaint  Patient presents with  . Animal Bite     (Consider location/radiation/quality/duration/timing/severity/associated sxs/prior Treatment) Patient is a 54 y.o. male presenting with animal bite. The history is provided by the patient. No language interpreter was used.  Animal Bite Associated symptoms: no fever and no numbness     HPI Comments: Erik Strong is a 54 y.o. male who presents to the Emergency Department complaining of a dog bit on his left upper arm onset last night when an an [place] bull dog caught him off guard and bit him .He has associated symptoms of pain to touch, and states his arm feels "weird". He has not taken any medication to alleviate his pain.  Patient notes that the dog is up to date on his vaccines, this is his neighbor's dog.  Patient notes that he has another incident with the dog last week and was clawed and had his front tooth knocked out. Patient is unsure of his last tetanus shot. Patient denies any other injuries by the dog, fevers, chills, nausea/vomitting.  He has no other complaints today.     Past Medical History  Diagnosis Date  . Chronic pain   . ADD (attention deficit disorder)    Past Surgical History  Procedure Laterality Date  . Hip surgery     Family History  Problem Relation Age of Onset  . Prostate cancer Father   . CAD Maternal Uncle    History  Substance Use Topics  . Smoking status: Current Every Day Smoker    Types: Cigarettes  . Smokeless tobacco: Not on file  . Alcohol Use: No    Review of Systems  Constitutional: Negative for fever and chills.  Eyes:  Negative for discharge.  Respiratory: Negative for shortness of breath.   Cardiovascular: Negative for chest pain.  Gastrointestinal: Negative for nausea and vomiting.  Musculoskeletal: Negative for myalgias and neck pain.  Skin: Positive for color change and wound.  Allergic/Immunologic: Negative for immunocompromised state.  Neurological: Negative for weakness and numbness.  Hematological: Does not bruise/bleed easily.  Psychiatric/Behavioral: Negative for self-injury.      Allergies  Codeine and Macrodantin  Home Medications   Prior to Admission medications   Medication Sig Start Date End Date Taking? Authorizing Provider  acetaminophen (TYLENOL) 650 MG CR tablet Take 650 mg by mouth every 8 (eight) hours as needed for pain.    Historical Provider, MD  ALPRAZolam Duanne Moron) 0.5 MG tablet Take 0.5 mg by mouth at bedtime as needed for anxiety.    Historical Provider, MD  amphetamine-dextroamphetamine (ADDERALL) 30 MG tablet Take 15 mg by mouth 2 (two) times daily.     Historical Provider, MD  oxyCODONE-acetaminophen (PERCOCET) 7.5-325 MG per tablet Take 1 tablet by mouth every 4 (four) hours as needed for pain. 05/04/14   Lacretia Leigh, MD  sulfamethoxazole-trimethoprim (BACTRIM DS) 800-160 MG per tablet Take 1 tablet by mouth daily. 05/20/14   Carlyle Basques, MD  tiZANidine (ZANAFLEX) 2 MG tablet Take 2 mg by mouth every 6 (six) hours as needed for muscle spasms.    Historical Provider, MD  traMADol Veatrice Bourbon)  50 MG tablet Take 50 mg by mouth every 6 (six) hours as needed for moderate pain or severe pain.    Historical Provider, MD   BP 144/84 mmHg  Pulse 70  Temp(Src) 98.4 F (36.9 C) (Oral)  Resp 18  Ht 6\' 1"  (1.854 m)  Wt 205 lb (92.987 kg)  BMI 27.05 kg/m2  SpO2 96% Physical Exam  Constitutional: He appears well-developed and well-nourished. No distress.  HENT:  Head: Normocephalic and atraumatic.  Neck: Neck supple.  Pulmonary/Chest: Effort normal.  Neurological: He is  alert.  Skin: He is not diaphoretic.  Left posterior arm with ecchymosis and scabbed abrasions vs puncture wounds No erythema edema or warmth Left arm distal sensation and pulses intact  Full active range of motion of left arm  Nursing note and vitals reviewed.   ED Course  Procedures (including critical care time) DIAGNOSTIC STUDIES: Oxygen Saturation is 96% on RA, normal by my interpretation.    COORDINATION OF CARE: 12:37 PM Discussed treatment plan with patient at beside, the patient agrees with the plan and has no further questions at this time.    Labs Review Labs Reviewed - No data to display  Imaging Review Dg Humerus Left  12/03/2014   CLINICAL DATA:  Dog bite 1 day prior  EXAM: LEFT HUMERUS - 2+ VIEW  COMPARISON:  None.  FINDINGS: Frontal and lateral views were obtained. No fracture or dislocation. No radiopaque foreign body. No soft tissue abscess. No erosive change or bony destruction.  IMPRESSION: No abnormality noted.   Electronically Signed   By: Lowella Grip III M.D.   On: 12/03/2014 13:36     EKG Interpretation None      MDM   Final diagnoses:  Dog bite   Afebrile, nontoxic patient with injury to his left upper arm injury by dog bite with scabbed wounds, eccymosis.  Tdap updated.  Animal control notified.  Dog is neighbor's dog, can be quarantined, supposedly up to date on vaccinations.   Xray negative.   D/C home with augmentin, percocet.  Discussed result, findings, treatment, and follow up  with patient.  Pt given return precautions.  Pt verbalizes understanding and agrees with plan.      I personally performed the services described in this documentation, which was scribed in my presence. The recorded information has been reviewed and is accurate.    Clayton Bibles, PA-C 12/03/14 [place], MD 12/03/14 [phone]

## 2014-12-03 NOTE — ED Notes (Signed)
Confirmed that animal control has been notified.

## 2014-12-03 NOTE — Discharge Instructions (Signed)
Read the information below.  Use the prescribed medication as directed.  Please discuss all new medications with your pharmacist.  Do not take additional tylenol while taking the prescribed pain medication to avoid overdose.  You may return to the Emergency Department at any time for worsening condition or any new symptoms that concern you.    If you develop redness, swelling, pus draining from the wound, or fevers greater than 100.4, return to the ER immediately for a recheck.     Animal Bite An animal bite can result in a scratch on the skin, deep open cut, puncture of the skin, crush injury, or tearing away of the skin or a body part. Dogs are responsible for most animal bites. Children are bitten more often than adults. An animal bite can range from very mild to more serious. A small bite from your house pet is no cause for alarm. However, some animal bites can become infected or injure a bone or other tissue. You must seek medical care if:  The skin is broken and bleeding does not slow down or stop after 15 minutes.  The puncture is deep and difficult to clean (such as a cat bite).  Pain, warmth, redness, or pus develops around the wound.  The bite is from a stray animal or rodent. There may be a risk of rabies infection.  The bite is from a snake, raccoon, skunk, fox, coyote, or bat. There may be a risk of rabies infection.  The person bitten has a chronic illness such as diabetes, liver disease, or cancer, or the person takes medicine that lowers the immune system.  There is concern about the location and severity of the bite. It is important to clean and protect an animal bite wound right away to prevent infection. Follow these steps:  Clean the wound with plenty of water and soap.  Apply an antibiotic cream.  Apply gentle pressure over the wound with a clean towel or gauze to slow or stop bleeding.  Elevate the affected area above the heart to help stop any bleeding.  Seek  medical care. Getting medical care within 8 hours of the animal bite leads to the best possible outcome. DIAGNOSIS  Your caregiver will most likely:  Take a detailed history of the animal and the bite injury.  Perform a wound exam.  Take your medical history. Blood tests or X-rays may be performed. Sometimes, infected bite wounds are cultured and sent to a lab to identify the infectious bacteria.  TREATMENT  Medical treatment will depend on the location and type of animal bite as well as the patient's medical history. Treatment may include:  Wound care, such as cleaning and flushing the wound with saline solution, bandaging, and elevating the affected area.  Antibiotics.  Tetanus immunization.  Rabies immunization.  Leaving the wound open to heal. This is often done with animal bites, due to the high risk of infection. However, in certain cases, wound closure with stitches, wound adhesive, skin adhesive strips, or staples may be used. Infected bites that are left untreated may require intravenous (IV) antibiotics and surgical treatment in the hospital. [place]  Follow your caregiver's instructions for wound care.  Take all medicines as directed.  If your caregiver prescribes antibiotics, take them as directed. Finish them even if you start to feel better.  Follow up with your caregiver for further exams or immunizations as directed. You may need a tetanus shot if:  You cannot remember when you had  your last tetanus shot.  You have never had a tetanus shot.  The injury broke your skin. If you get a tetanus shot, your arm may swell, get red, and feel warm to the touch. This is common and not a problem. If you need a tetanus shot and you choose not to have one, there is a rare chance of getting tetanus. Sickness from tetanus can be serious. SEEK MEDICAL CARE IF:  You notice warmth, redness, soreness, swelling, pus discharge, or a bad smell coming from the  wound.  You have a red line on the skin coming from the wound.  You have a fever, chills, or a general ill feeling.  You have nausea or vomiting.  You have continued or worsening pain.  You have trouble moving the injured part.  You have other questions or concerns. MAKE SURE YOU:  Understand these instructions.  Will watch your condition.  Will get help right away if you are not doing well or get worse. Document Released: 05/24/2011 Document Revised: 11/28/2011 Document Reviewed: 05/24/2011 [facility] Patient Information 2015 [place], [place]. This information is not intended to replace advice given to you by your health care provider. Make sure you discuss any questions you have with your health care provider.

## 2014-12-04 ENCOUNTER — Emergency Department (HOSPITAL_COMMUNITY)
Admission: EM | Admit: 2014-12-04 | Discharge: 2014-12-04 | Disposition: A | Discharge status: Home / Self Care | Payer: Medicare Other | Attending: Emergency Medicine | Admitting: Emergency Medicine

## 2014-12-04 ENCOUNTER — Encounter (HOSPITAL_COMMUNITY): Payer: Self-pay | Admitting: *Deleted

## 2014-12-04 ENCOUNTER — Emergency Department (HOSPITAL_COMMUNITY): Payer: Medicare Other

## 2014-12-04 DIAGNOSIS — W540XXA Bitten by dog, initial encounter: Secondary | ICD-10-CM | POA: Diagnosis not present

## 2014-12-04 DIAGNOSIS — Y929 Unspecified place or not applicable: Secondary | ICD-10-CM | POA: Diagnosis not present

## 2014-12-04 DIAGNOSIS — S96911A Strain of unspecified muscle and tendon at ankle and foot level, right foot, initial encounter: Secondary | ICD-10-CM | POA: Diagnosis not present

## 2014-12-04 DIAGNOSIS — Y9389 Activity, other specified: Secondary | ICD-10-CM | POA: Insufficient documentation

## 2014-12-04 DIAGNOSIS — Z720 Tobacco use: Secondary | ICD-10-CM | POA: Insufficient documentation

## 2014-12-04 DIAGNOSIS — Z792 Long term (current) use of antibiotics: Secondary | ICD-10-CM | POA: Diagnosis not present

## 2014-12-04 DIAGNOSIS — Y998 Other external cause status: Secondary | ICD-10-CM | POA: Insufficient documentation

## 2014-12-04 DIAGNOSIS — F909 Attention-deficit hyperactivity disorder, unspecified type: Secondary | ICD-10-CM | POA: Insufficient documentation

## 2014-12-04 DIAGNOSIS — Z23 Encounter for immunization: Secondary | ICD-10-CM | POA: Diagnosis not present

## 2014-12-04 DIAGNOSIS — G8929 Other chronic pain: Secondary | ICD-10-CM | POA: Diagnosis not present

## 2014-12-04 DIAGNOSIS — Z79899 Other long term (current) drug therapy: Secondary | ICD-10-CM | POA: Insufficient documentation

## 2014-12-04 DIAGNOSIS — M79674 Pain in right toe(s): Secondary | ICD-10-CM | POA: Diagnosis present

## 2014-12-04 MED ORDER — ALUM & MAG HYDROXIDE-SIMETH 200-200-20 MG/5ML PO SUSP
15.0000 mL | Freq: Once | ORAL | Status: AC
  Administered 2014-12-04: 15 mL via ORAL
  Filled 2014-12-04: qty 30

## 2014-12-04 MED ORDER — KETOROLAC TROMETHAMINE 60 MG/2ML IM SOLN
60.0000 mg | Freq: Once | INTRAMUSCULAR | Status: AC
  Administered 2014-12-04: 60 mg via INTRAMUSCULAR
  Filled 2014-12-04: qty 2

## 2014-12-04 NOTE — ED Notes (Signed)
Declined W/C at D/C and was escorted to lobby by RN. 

## 2014-12-04 NOTE — ED Provider Notes (Signed)
CSN: [id number]     Arrival date & time 12/04/14  3818 History   First MD Initiated Contact with Patient 12/04/14 0754     No chief complaint on file.    (Consider location/radiation/quality/duration/timing/severity/associated sxs/prior Treatment) HPI Comments: Pt comes in with c/o right great toe pain. Pt states that he was seen yesterday after an altercation with a dog. He states that because of the laceration he didn't realize until last night that he toe was hurting.  The history is provided by the patient. No language interpreter was used.    Past Medical History  Diagnosis Date  . Chronic pain   . ADD (attention deficit disorder)    Past Surgical History  Procedure Laterality Date  . Hip surgery     Family History  Problem Relation Age of Onset  . Prostate cancer Father   . CAD Maternal Uncle    History  Substance Use Topics  . Smoking status: Current Every Day Smoker    Types: Cigarettes  . Smokeless tobacco: Not on file  . Alcohol Use: No    Review of Systems  All other systems reviewed and are negative.     Allergies  Codeine and Macrodantin  Home Medications   Prior to Admission medications   Medication Sig Start Date End Date Taking? Authorizing Provider  acetaminophen (TYLENOL) 650 MG CR tablet Take 650 mg by mouth every 8 (eight) hours as needed for pain.    Historical Provider, MD  ALPRAZolam Duanne Moron) 0.5 MG tablet Take 0.5 mg by mouth at bedtime as needed for anxiety.    Historical Provider, MD  amoxicillin-clavulanate (AUGMENTIN) 875-125 MG per tablet Take 1 tablet by mouth 2 (two) times daily. One po bid x 7 days 12/03/14   Clayton Bibles, PA-C  amphetamine-dextroamphetamine (ADDERALL) 30 MG tablet Take 15 mg by mouth 2 (two) times daily.     Historical Provider, MD  oxyCODONE-acetaminophen (PERCOCET/ROXICET) 5-325 MG per tablet Take 1-2 tablets by mouth every 4 (four) hours as needed for severe pain. 12/03/14   Clayton Bibles, PA-C   sulfamethoxazole-trimethoprim (BACTRIM DS) 800-160 MG per tablet Take 1 tablet by mouth daily. 05/20/14   Carlyle Basques, MD  tiZANidine (ZANAFLEX) 2 MG tablet Take 2 mg by mouth every 6 (six) hours as needed for muscle spasms.    Historical Provider, MD  traMADol (ULTRAM) 50 MG tablet Take 50 mg by mouth every 6 (six) hours as needed for moderate pain or severe pain.    Historical Provider, MD   There were no vitals taken for this visit. Physical Exam  Constitutional: He is oriented to person, place, and time. He appears well-developed and well-nourished.  Cardiovascular: Normal rate and regular rhythm.   Pulmonary/Chest: Effort normal and breath sounds normal.  Musculoskeletal: Normal range of motion.  tender at the right dip. No deformity or swelling to the area  Neurological: He is alert and oriented to person, place, and time.  Skin: Skin is warm and dry.  Nursing note and vitals reviewed.   ED Course  Procedures (including critical care time) Labs Review Labs Reviewed - No data to display  Imaging Review Dg Humerus Left  12/03/2014   CLINICAL DATA:  Dog bite 1 day prior  EXAM: LEFT HUMERUS - 2+ VIEW  COMPARISON:  None.  FINDINGS: Frontal and lateral views were obtained. No fracture or dislocation. No radiopaque foreign body. No soft tissue abscess. No erosive change or bony destruction.  IMPRESSION: No abnormality noted.   Electronically Signed  By: Lowella Grip III M.D.   On: 12/03/2014 13:36     EKG Interpretation None      MDM   Final diagnoses:  Strain of toe, right, initial encounter    No acute bony abnormality noted. Pt has pain medication at home    Glendell Docker, NP 12/04/14 0932  Charlesetta Shanks, MD 12/07/14 [phone]

## 2014-12-04 NOTE — Discharge Instructions (Signed)
Foot Sprain The muscles and cord like structures which attach muscle to bone (tendons) that surround the feet are made up of units. A foot sprain can occur at the weakest spot in any of these units. This condition is most often caused by injury to or overuse of the foot, as from playing contact sports, or aggravating a previous injury, or from poor conditioning, or obesity. SYMPTOMS  Pain with movement of the foot.  Tenderness and swelling at the injury site.  Loss of strength is present in moderate or severe sprains. THE THREE GRADES OR SEVERITY OF FOOT SPRAIN ARE:  Mild (Grade I): Slightly pulled muscle without tearing of muscle or tendon fibers or loss of strength.  Moderate (Grade II): Tearing of fibers in a muscle, tendon, or at the attachment to bone, with small decrease in strength.  Severe (Grade III): Rupture of the muscle-tendon-bone attachment, with separation of fibers. Severe sprain requires surgical repair. Often repeating (chronic) sprains are caused by overuse. Sudden (acute) sprains are caused by direct injury or over-use. DIAGNOSIS  Diagnosis of this condition is usually by your own observation. If problems continue, a caregiver may be required for further evaluation and treatment. X-rays may be required to make sure there are not breaks in the bones (fractures) present. Continued problems may require physical therapy for treatment. PREVENTION  Use strength and conditioning exercises appropriate for your sport.  Warm up properly prior to working out.  Use athletic shoes that are made for the sport you are participating in.  Allow adequate time for healing. Early return to activities makes repeat injury more likely, and can lead to an unstable arthritic foot that can result in prolonged disability. Mild sprains generally heal in 3 to 10 days, with moderate and severe sprains taking 2 to 10 weeks. Your caregiver can help you determine the proper time required for  healing. HOME CARE INSTRUCTIONS   Apply ice to the injury for 15-20 minutes, 03-04 times per day. Put the ice in a plastic bag and place a towel between the bag of ice and your skin.  An elastic wrap (like an Ace bandage) may be used to keep swelling down.  Keep foot above the level of the heart, or at least raised on a footstool, when swelling and pain are present.  Try to avoid use other than gentle range of motion while the foot is painful. Do not resume use until instructed by your caregiver. Then begin use gradually, not increasing use to the point of pain. If pain does develop, decrease use and continue the above measures, gradually increasing activities that do not cause discomfort, until you gradually achieve normal use.  Use crutches if and as instructed, and for the length of time instructed.  Keep injured foot and ankle wrapped between treatments.  Massage foot and ankle for comfort and to keep swelling down. Massage from the toes up towards the knee.  Only take over-the-counter or prescription medicines for pain, discomfort, or fever as directed by your caregiver. SEEK IMMEDIATE MEDICAL CARE IF:   Your pain and swelling increase, or pain is not controlled with medications.  You have loss of feeling in your foot or your foot turns cold or blue.  You develop new, unexplained symptoms, or an increase of the symptoms that brought you to your caregiver. MAKE SURE YOU:   Understand these instructions.  Will watch your condition.  Will get help right away if you are not doing well or get worse. Document Released:   02/25/2002 Document Revised: 11/28/2011 Document Reviewed: 04/24/2008 [employer] Patient Information 2015 [employer]. This information is not intended to replace advice given to you by your health care provider. Make sure you discuss any questions you have with your health care provider.  

## 2014-12-04 NOTE — ED Notes (Addendum)
Pt reports pain to rt big  toe .

## 2015-01-29 ENCOUNTER — Emergency Department (HOSPITAL_COMMUNITY): Payer: Medicare Other

## 2015-01-29 ENCOUNTER — Emergency Department (HOSPITAL_COMMUNITY)
Admission: EM | Admit: 2015-01-29 | Discharge: 2015-01-29 | Disposition: A | Discharge status: Home / Self Care | Payer: Medicare Other | Attending: Emergency Medicine | Admitting: Emergency Medicine

## 2015-01-29 ENCOUNTER — Encounter (HOSPITAL_COMMUNITY): Payer: Self-pay | Admitting: Cardiology

## 2015-01-29 DIAGNOSIS — R52 Pain, unspecified: Secondary | ICD-10-CM

## 2015-01-29 DIAGNOSIS — Z79899 Other long term (current) drug therapy: Secondary | ICD-10-CM | POA: Diagnosis not present

## 2015-01-29 DIAGNOSIS — W228XXA Striking against or struck by other objects, initial encounter: Secondary | ICD-10-CM | POA: Diagnosis not present

## 2015-01-29 DIAGNOSIS — Z720 Tobacco use: Secondary | ICD-10-CM | POA: Insufficient documentation

## 2015-01-29 DIAGNOSIS — Y998 Other external cause status: Secondary | ICD-10-CM | POA: Insufficient documentation

## 2015-01-29 DIAGNOSIS — G8929 Other chronic pain: Secondary | ICD-10-CM | POA: Diagnosis not present

## 2015-01-29 DIAGNOSIS — Z792 Long term (current) use of antibiotics: Secondary | ICD-10-CM | POA: Insufficient documentation

## 2015-01-29 DIAGNOSIS — Y92008 Other place in unspecified non-institutional (private) residence as the place of occurrence of the external cause: Secondary | ICD-10-CM | POA: Insufficient documentation

## 2015-01-29 DIAGNOSIS — F909 Attention-deficit hyperactivity disorder, unspecified type: Secondary | ICD-10-CM | POA: Insufficient documentation

## 2015-01-29 DIAGNOSIS — Y9389 Activity, other specified: Secondary | ICD-10-CM | POA: Diagnosis not present

## 2015-01-29 DIAGNOSIS — S92251A Displaced fracture of navicular [scaphoid] of right foot, initial encounter for closed fracture: Secondary | ICD-10-CM | POA: Diagnosis not present

## 2015-01-29 DIAGNOSIS — S99921A Unspecified injury of right foot, initial encounter: Secondary | ICD-10-CM | POA: Diagnosis present

## 2015-01-29 MED ORDER — OXYCODONE-ACETAMINOPHEN 5-325 MG PO TABS: 1.0000 | ORAL_TABLET | ORAL | Status: DC | PRN

## 2015-01-29 MED ORDER — OXYCODONE-ACETAMINOPHEN 5-325 MG PO TABS
1.0000 | ORAL_TABLET | Freq: Once | ORAL | Status: AC
  Administered 2015-01-29: 1 via ORAL
  Filled 2015-01-29: qty 1

## 2015-01-29 NOTE — ED Provider Notes (Signed)
CSN: [id number]     Arrival date & time 01/29/15  1340 History  This chart was scribed for Erik Pean, PA-C working with Orpah Greek, MD by Randa Evens, ED Scribe. This patient was seen in room TR06C/TR06C and the patient's care was started at 2:04 PM.      Chief Complaint  Patient presents with  . Foot Pain   The history is provided by the patient. No language interpreter was used.   HPI Comments: Erik Strong is a 54 y.o. male who presents to the Emergency Department complaining of new right foot pain onset 2 hours prior. Pt rates the severity of his pain 10/10. Pt states that he kicked a well house trying to kill a spider with the medial aspect of his foot. Pt denies any medications PTA. Pt states that the pain is worse with movement. Pt denies ankle pain, numbness or tingling. Pt denies any previous injury to foot.     Past Medical History  Diagnosis Date  . Chronic pain   . ADD (attention deficit disorder)    Past Surgical History  Procedure Laterality Date  . Hip surgery     Family History  Problem Relation Age of Onset  . Prostate cancer Father   . CAD Maternal Uncle    History  Substance Use Topics  . Smoking status: Current Every Day Smoker    Types: Cigarettes  . Smokeless tobacco: Not on file  . Alcohol Use: No    Review of Systems  Constitutional: Negative for fever.  Musculoskeletal: Positive for arthralgias.  Skin: Negative for wound.  Neurological: Negative for weakness and numbness.     Allergies  Codeine and Macrodantin  Home Medications   Prior to Admission medications   Medication Sig Start Date End Date Taking? Authorizing Provider  acetaminophen (TYLENOL) 650 MG CR tablet Take 650 mg by mouth every 8 (eight) hours as needed for pain.    Historical Provider, MD  ALPRAZolam Duanne Moron) 0.5 MG tablet Take 0.5 mg by mouth at bedtime as needed for anxiety.    Historical Provider, MD  amoxicillin-clavulanate (AUGMENTIN) 875-125 MG per  tablet Take 1 tablet by mouth 2 (two) times daily. One po bid x 7 days 12/03/14   Clayton Bibles, PA-C  amphetamine-dextroamphetamine (ADDERALL) 30 MG tablet Take 15 mg by mouth 2 (two) times daily.     Historical Provider, MD  oxyCODONE-acetaminophen (PERCOCET/ROXICET) 5-325 MG per tablet Take 1 tablet by mouth every 4 (four) hours as needed for severe pain. May take 2 tablets PO q 6 hours for severe pain - Do not take with Tylenol as this tablet already contains tylenol 01/29/15   Erik Pean, PA-C  sulfamethoxazole-trimethoprim (BACTRIM DS) 800-160 MG per tablet Take 1 tablet by mouth daily. 05/20/14   Carlyle Basques, MD  tiZANidine (ZANAFLEX) 2 MG tablet Take 2 mg by mouth every 6 (six) hours as needed for muscle spasms.    Historical Provider, MD  traMADol (ULTRAM) 50 MG tablet Take 50 mg by mouth every 6 (six) hours as needed for moderate pain or severe pain.    Historical Provider, MD   BP 117/77 mmHg  Pulse 89  Temp(Src) 97.8 F (36.6 C) (Oral)  Resp 18  Ht 6\' 1"  (1.854 m)  SpO2 100%   Physical Exam  Constitutional: He appears well-developed and well-nourished. No distress.  HENT:  Head: Normocephalic and atraumatic.  Eyes: Right eye exhibits no discharge. Left eye exhibits no discharge.  Cardiovascular: Intact distal pulses.  Bilateral DP and PT pulses intact. Capillary refill less than 2 seconds to distal right toes.   Pulmonary/Chest: Effort normal. No respiratory distress.  Musculoskeletal: He exhibits tenderness.  Tenderness to medial aspect of left foot. No dorsal foot pain. No ankle tenderness, no foot edema. Able to move all distal toes, full ankle ROM. No obvious deformity.   Neurological: He is alert. Coordination normal.  Sensation is intact to his distal lower extremities.  Skin: Skin is warm and dry. No rash noted. He is not diaphoretic. No erythema. No pallor.  Psychiatric: He has a normal mood and affect. His behavior is normal.  Nursing note and vitals  reviewed.   ED Course  Procedures (including critical care time) DIAGNOSTIC STUDIES: Oxygen Saturation is 99% on RA, normal by my interpretation.    COORDINATION OF CARE: 2:24 PM-Discussed treatment plan with pt at bedside and pt agreed to plan.     Labs Review Labs Reviewed - No data to display  Imaging Review Dg Foot Complete Right  01/29/2015   CLINICAL DATA:  Medial right foot pain and swelling after blunt trauma.  EXAM: RIGHT FOOT COMPLETE - 3+ VIEW  COMPARISON:  None.  FINDINGS: There is subtle area of irregularity and lucency in the medial aspect of the navicular which could represent a hairline fracture. Is the patient tender over the medial aspect of the navicular?  Osseous structures of the foot are otherwise normal.  IMPRESSION: Possible hairline fracture of the medial aspect of the navicular.   Electronically Signed   By: Lorriane Shire M.D.   On: 01/29/2015 14:53     EKG Interpretation None      Filed Vitals:   01/29/15 1348 01/29/15 1516 01/29/15 1526  BP: 116/80  117/77  Pulse: 89  89  Temp: 98 F (36.7 C)  97.8 F (36.6 C)  TempSrc: Oral  Oral  Resp: 22  18  Height:  6\' 1"  (1.854 m)   SpO2: 99%  100%     MDM   Meds given in ED:  Medications  oxyCODONE-acetaminophen (PERCOCET/ROXICET) 5-325 MG per tablet 1 tablet (1 tablet Oral Given 01/29/15 1521)    New Prescriptions   OXYCODONE-ACETAMINOPHEN (PERCOCET/ROXICET) 5-325 MG PER TABLET    Take 1 tablet by mouth every 4 (four) hours as needed for severe pain. May take 2 tablets PO q 6 hours for severe pain - Do not take with Tylenol as this tablet already contains tylenol    Final diagnoses:  Navicular fracture of ankle, right, closed, initial encounter    This is a 54 year old male who presents to the emergency department complaining of right foot pain after he kicked the side of oh well the medial aspect of his right about 2 hours prior to arrival. He is complaining of 10 out of 10 pain to the medial  aspect of his right foot. Right foot x-ray indicates possible hairline fracture of the medial aspect of the navicular. Will place the patient and posterior splint and make him nonweightbearing and have him follow-up with orthopedic surgeon Dr. Veverly Fells. The patient's most recent CMP indicated an elevated creatinine of 1.5. Due to his elevated creatinine will not prescribe any NSAIDs. Patient complaining of extreme pain so will provide him with the short course of Percocet until he is able to follow-up with orthopedic surgery. I advised the patient to follow-up with their primary care provider this week. I advised the patient to return to the emergency department with new or worsening symptoms or  new concerns. The patient verbalized understanding and agreement with plan.    I personally performed the services described in this documentation, which was scribed in my presence. The recorded information has been reviewed and is accurate.    Erik Pean, PA-C 01/29/15 [place], MD 01/29/15 [phone]

## 2015-01-29 NOTE — ED Notes (Signed)
Pt reports he thinks he may have broken bones in the right foot. He kicked a spider on a well house and started having pain.

## 2015-01-29 NOTE — Discharge Instructions (Signed)
X ray showed a possible hairline fracture of your navicular.  Tarsal Navicular Fracture A fracture is a break in the bone. The tarsal navicular is a moderate sized bone of the midfoot on the inner side of the foot. This bone can be fractured in several ways and there are many different ways of treating these fractures. Some of the ways of treating these are as follows: TREATMENT   Immobilization, which means the fracture is casted as it is without changing the positions of the fracture (bone pieces) involved. This procedure is used when the bone fragments remain in their normal position (are not displaced).  ORIF (open reduction and internal fixation), in which the fracture site is opened and the bone pieces are fixed into place with some type of hardware (screws or pins). This type of repair is usually used when the body of the navicular is fractured. It is indicated in most fractures that are displaced. Your caregiver will discuss the type of fracture you have and the treatment that will be best for that problem. If surgery is the treatment of choice, the following is information for you to know and also let your caregiver know about prior to surgery.  LET YOUR CAREGIVER KNOW ABOUT: 1. Allergies. 2. Medicine taken including herbs, eye drops, over the counter medications, and creams. 3. Use of steroids (by mouth or creams) 4. Previous problems with anesthetics or novocaine. 5. Possibility of pregnancy, if this applies. 6. History of blood clots (thrombophlebitis). 7. History of bleeding or blood problems. 8. Previous surgery. 9. Other health problems. 10. Family history of anesthetic problems. AFTER THE PROCEDURE After surgery, you will be taken to the recovery area where a nurse will watch and check your progress. Once you are awake, stable, and taking fluids well, barring other problems you will be allowed to go home. Once home, an ice pack applied to your operative site may help with  discomfort and keep the swelling down. Elevate your foot above your heart as much as possible for the first 4-6 days after surgery. HOME CARE INSTRUCTIONS  1. Follow your caregiver's instructions as to activities, exercises, physical therapy, and driving a car. 2. Daily exercise is helpful to prevent return of problems. Maintain strength and range of motion as instructed. 3. Only take over-the-counter or prescription medicines for pain, discomfort, or fever as directed by your caregiver. 4. See your caregiver as directed. It is very important to keep all follow-up referrals and appointments in order to avoid any long-term problems with your ankle and foot including chronic pain, inability to move the ankle or foot normally, and permanent disability. SEEK MEDICAL CARE IF:  1. Increased bleeding (more than a small spot) from the wound or from beneath your cast or splint. 2. Redness, swelling, or increasing pain in the wound or from beneath your cast or splint. 3. Pus coming from wound or from beneath the cast, splint or fracture boot. 4. An unexplained oral temperature over 102 F (38.9 C). 5. A foul smell coming from the wound or dressing or from beneath your cast or splint. SEEK IMMEDIATE MEDICAL CARE IF:  1. You begin to lose feeling in your foot or toes, or develop swelling of the foot or toes. 2. You get a cold or blue foot or toes on the injured side. 3. You develop pain not relieved by medicine. 4. You develop a rash. 5. You have difficulty breathing. 6. You have any allergic problems. If you do not have a window  in your cast for observing the wound, a discharge or minor bleeding may show up as a stain on the outside of your cast. Report these findings to your caregiver. If you are placed into a fracture boot after your operation, you should expect minor bleeding through the dressings. Change the dressings as instructed by your caregiver. Document Released: 12/18/2000 Document Revised:  11/28/2011 Document Reviewed: 11/08/2013 [facility] Patient Information 2015 [place], [place]. This information is not intended to replace advice given to you by your health care provider. Make sure you discuss any questions you have with your health care provider. Cast or Splint Care Casts and splints support injured limbs and keep bones from moving while they heal. It is important to care for your cast or splint at home.  HOME CARE INSTRUCTIONS  Keep the cast or splint uncovered during the drying period. It can take 24 to 48 hours to dry if it is made of plaster. A fiberglass cast will dry in less than 1 hour.  Do not rest the cast on anything harder than a pillow for the first 24 hours.  Do not put weight on your injured limb or apply pressure to the cast until your health care provider gives you permission.  Keep the cast or splint dry. Wet casts or splints can lose their shape and may not support the limb as well. A wet cast that has lost its shape can also create harmful pressure on your skin when it dries. Also, wet skin can become infected.  Cover the cast or splint with a plastic bag when bathing or when out in the rain or snow. If the cast is on the trunk of the body, take sponge baths until the cast is removed.  If your cast does become wet, dry it with a towel or a blow dryer on the cool setting only.  Keep your cast or splint clean. Soiled casts may be wiped with a moistened cloth.  Do not place any hard or soft foreign objects under your cast or splint, such as cotton, toilet paper, lotion, or powder.  Do not try to scratch the skin under the cast with any object. The object could get stuck inside the cast. Also, scratching could lead to an infection. If itching is a problem, use a blow dryer on a cool setting to relieve discomfort.  Do not trim or cut your cast or remove padding from inside of it.  Exercise all joints next to the injury that are not immobilized by the cast or  splint. For example, if you have a long leg cast, exercise the hip joint and toes. If you have an arm cast or splint, exercise the shoulder, elbow, thumb, and fingers.  Elevate your injured arm or leg on 1 or 2 pillows for the first 1 to 3 days to decrease swelling and pain.It is best if you can comfortably elevate your cast so it is higher than your heart. SEEK MEDICAL CARE IF:  11. Your cast or splint cracks. 12. Your cast or splint is too tight or too loose. 13. You have unbearable itching inside the cast. 14. Your cast becomes wet or develops a soft spot or area. 15. You have a bad smell coming from inside your cast. 16. You get an object stuck under your cast. 17. Your skin around the cast becomes red or raw. 18. You have new pain or worsening pain after the cast has been applied. SEEK IMMEDIATE MEDICAL CARE IF:  5. You  have fluid leaking through the cast. 6. You are unable to move your fingers or toes. 7. You have discolored (blue or white), cool, painful, or very swollen fingers or toes beyond the cast. 8. You have tingling or numbness around the injured area. 9. You have severe pain or pressure under the cast. 10. You have any difficulty with your breathing or have shortness of breath. 11. You have chest pain. Document Released: 09/02/2000 Document Revised: 06/26/2013 Document Reviewed: 03/14/2013 [facility] Patient Information 2015 [place], [place]. This information is not intended to replace advice given to you by your health care provider. Make sure you discuss any questions you have with your health care provider. Crutch Use Crutches are used to take weight off one of your legs or feet when you stand or walk. It is important to use crutches that fit properly. When fitted properly:  Each crutch should be 2-3 finger widths below the armpit.  Your weight should be supported by your hand, and not by resting the armpit on the crutch.  RISKS AND COMPLICATIONS Damage to the nerves  that extend from your armpit to your hand and arm. To prevent this from happening, make sure your crutches fit properly and do not put pressure on your armpit when using them. HOW TO USE YOUR CRUTCHES If you have been instructed to use partial weight bearing, apply (bear) the amount of weight as your health care provider suggests. Do not bear weight in an amount that causes pain to the area of injury. Walking 19. Step with the crutches. 20. Swing the healthy leg slightly ahead of the crutches. Going Up Steps If there is no handrail: 12. Step up with the healthy leg. 13. Step up with the crutches and injured leg. 14. Continue in this way. If there is a handrail: 6. Hold both crutches in one hand. 7. Place your free hand on the handrail. 8. While putting your weight on your arms, lift your healthy leg to the step. 9. Bring the crutches and the injured leg up to that step. 10. Continue in this way. Going Down Steps Be very careful, as going down stairs with crutches is very challenging. If there is no handrail: 7. Step down with the injured leg and crutches. 8. Step down with the healthy leg. If there is a handrail: 1. Place your hand on the handrail. 2. Hold both crutches with your free hand. 3. Lower your injured leg and crutch to the step below you. Make sure to keep the crutch tips in the center of the step, never on the edge. 4. Lower your healthy leg to that step. 5. Continue in this way. Standing Up 1. Hold the injured leg forward. 2. Grab the armrest with one hand and the top of the crutches with the other hand. 3. Using these supports, pull yourself up to a standing position. Sitting Down 1. Hold the injured leg forward. 2. Grab the armrest with one hand and the top of the crutches with the other hand. 3. Lower yourself to a sitting position. SEEK MEDICAL CARE IF:  You still feel unsteady on your feet.  You develop new pain, for example in your armpits, back, shoulder,  wrist, or hip.  You develop any numbness or tingling. SEEK IMMEDIATE MEDICAL CARE IF: You fall. Document Released: 09/02/2000 Document Revised: 09/10/2013 Document Reviewed: 05/13/2013 [facility] Patient Information 2015 [place], [place]. This information is not intended to replace advice given to you by your health care provider. Make sure you discuss  any questions you have with your health care provider. ° °

## 2015-01-29 NOTE — ED Notes (Signed)
Pt states he hit his right foot on a building this am. Was kicking at a spider.

## 2015-01-29 NOTE — ED Notes (Signed)
Ortho paged for splint 

## 2015-01-29 NOTE — ED Notes (Signed)
Erik Strong with ortho at bedside.

## 2015-01-29 NOTE — Progress Notes (Signed)
Orthopedic Tech Progress Note Patient Details:  Erik Strong 1961/01/31 [id number]  Ortho Devices Type of Ortho Device: Ace wrap, Crutches, Post (short leg) splint Ortho Device/Splint Location: RLE Ortho Device/Splint Interventions: Ordered, Application   Braulio Bosch 01/29/2015, 3:38 PM

## 2016-12-27 ENCOUNTER — Encounter (HOSPITAL_COMMUNITY)
Admission: RE | Admit: 2016-12-27 | Discharge: 2016-12-27 | Disposition: A | Discharge status: Home / Self Care | Payer: Medicare Other | Source: Ambulatory Visit | Attending: Oral Surgery | Admitting: Oral Surgery

## 2016-12-27 ENCOUNTER — Encounter (HOSPITAL_COMMUNITY): Payer: Self-pay

## 2016-12-27 DIAGNOSIS — Z881 Allergy status to other antibiotic agents status: Secondary | ICD-10-CM | POA: Diagnosis not present

## 2016-12-27 DIAGNOSIS — K0889 Other specified disorders of teeth and supporting structures: Secondary | ICD-10-CM | POA: Diagnosis not present

## 2016-12-27 DIAGNOSIS — Z87442 Personal history of urinary calculi: Secondary | ICD-10-CM | POA: Diagnosis not present

## 2016-12-27 DIAGNOSIS — Z01812 Encounter for preprocedural laboratory examination: Secondary | ICD-10-CM | POA: Insufficient documentation

## 2016-12-27 DIAGNOSIS — K089 Disorder of teeth and supporting structures, unspecified: Secondary | ICD-10-CM

## 2016-12-27 DIAGNOSIS — F909 Attention-deficit hyperactivity disorder, unspecified type: Secondary | ICD-10-CM | POA: Diagnosis not present

## 2016-12-27 DIAGNOSIS — Z91013 Allergy to seafood: Secondary | ICD-10-CM | POA: Diagnosis not present

## 2016-12-27 DIAGNOSIS — Z885 Allergy status to narcotic agent status: Secondary | ICD-10-CM | POA: Diagnosis not present

## 2016-12-27 DIAGNOSIS — M270 Developmental disorders of jaws: Secondary | ICD-10-CM | POA: Diagnosis not present

## 2016-12-27 DIAGNOSIS — Z79899 Other long term (current) drug therapy: Secondary | ICD-10-CM | POA: Diagnosis not present

## 2016-12-27 DIAGNOSIS — G8929 Other chronic pain: Secondary | ICD-10-CM | POA: Diagnosis not present

## 2016-12-27 DIAGNOSIS — F419 Anxiety disorder, unspecified: Secondary | ICD-10-CM | POA: Diagnosis not present

## 2016-12-27 HISTORY — DX: Personal history of urinary calculi: Z87.442

## 2016-12-27 HISTORY — DX: Unspecified osteoarthritis, unspecified site: M19.90

## 2016-12-27 HISTORY — DX: Anxiety disorder, unspecified: F41.9

## 2016-12-27 HISTORY — DX: Unspecified injury of head, initial encounter: S09.90XA

## 2016-12-27 LAB — COMPREHENSIVE METABOLIC PANEL
ALBUMIN: 3.9 g/dL (ref 3.5–5.0)
ALK PHOS: 56 U/L (ref 38–126)
ALT: 28 U/L (ref 17–63)
AST: 32 U/L (ref 15–41)
Anion gap: 8 (ref 5–15)
BUN: 15 mg/dL (ref 6–20)
CHLORIDE: 104 mmol/L (ref 101–111)
CO2: 26 mmol/L (ref 22–32)
CREATININE: 1.41 mg/dL — AB (ref 0.61–1.24)
Calcium: 9.2 mg/dL (ref 8.9–10.3)
GFR calc non Af Amer: 55 mL/min — ABNORMAL LOW (ref >60)
GLUCOSE: 102 mg/dL — AB (ref 65–99)
Potassium: 4.5 mmol/L (ref 3.5–5.1)
SODIUM: 138 mmol/L (ref 135–145)
Total Bilirubin: 0.8 mg/dL (ref 0.3–1.2)
Total Protein: 7 g/dL (ref 6.5–8.1)

## 2016-12-27 LAB — CBC
HCT: 47.2 % (ref 39.0–52.0)
HEMOGLOBIN: 15.5 g/dL (ref 13.0–17.0)
MCH: 31 pg (ref 26.0–34.0)
MCHC: 32.8 g/dL (ref 30.0–36.0)
MCV: 94.4 fL (ref 78.0–100.0)
PLATELETS: 336 10*3/uL (ref 150–400)
RBC: 5 MIL/uL (ref 4.22–5.81)
RDW: 13.6 % (ref 11.5–15.5)
WBC: 9.8 10*3/uL (ref 4.0–10.5)

## 2016-12-27 NOTE — Pre-Procedure Instructions (Signed)
Erik Strong  12/27/2016      [id number] [place], [place] - [address] [place] [address] Phone: [phone] Fax: [phone]    Your procedure is scheduled on  Friday  12/30/16  Report to [facility] Admitting at 530 A.M.  Call this number if you have problems the morning of surgery:  [phone]   Remember:  Do not eat food or drink liquids after midnight.  Take these medicines the morning of surgery with A SIP OF WATER   ALPRAZOLAM IF NEEDED, AMOXICILLIN, ADDERALL, TRAMADOL IF NEEDED   (STOP NOW TAKING ASPIRIN OR ASPIRIN PRODUCTS, IBUPROFEN/ADVIL/MOTRIN, GOODY POWDERS, BCS, HERBAL MEDICINES)   Do not wear jewelry, make-up or nail polish.  Do not wear lotions, powders, or perfumes, or deoderant.  Do not shave 48 hours prior to surgery.  Men may shave face and neck.  Do not bring valuables to the hospital.  [facility] is not responsible for any belongings or valuables.  Contacts, dentures or bridgework may not be worn into surgery.  Leave your suitcase in the car.  After surgery it may be brought to your room.  For patients admitted to the hospital, discharge time will be determined by your treatment team.  Patients discharged the day of surgery will not be allowed to drive home.   Name and phone number of your driver:    Special instructions:  [facility] - Preparing for Surgery  Before surgery, you can play an important role.  Because skin is not sterile, your skin needs to be as free of germs as possible.  You can reduce the number of germs on you skin by washing with CHG (chlorahexidine gluconate) soap before surgery.  CHG is an antiseptic cleaner which kills germs and bonds with the skin to continue killing germs even after washing.  Please DO NOT use if you have an allergy to CHG or antibacterial soaps.  If your skin becomes reddened/irritated stop using the CHG and inform your nurse when  you arrive at Short Stay.  Do not shave (including legs and underarms) for at least 48 hours prior to the first CHG shower.  You may shave your face.  Please follow these instructions carefully:   1.  Shower with CHG Soap the night before surgery and the                                morning of Surgery.  2.  If you choose to wash your hair, wash your hair first as usual with your       normal shampoo.  3.  After you shampoo, rinse your hair and body thoroughly to remove the                      Shampoo.  4.  Use CHG as you would any other liquid soap.  You can apply chg directly       to the skin and wash gently with scrungie or a clean washcloth.  5.  Apply the CHG Soap to your body ONLY FROM THE NECK DOWN.        Do not use on open wounds or open sores.  Avoid contact with your eyes,       ears, mouth and genitals (private parts).  Wash genitals (private parts)  with your normal soap.  6.  Wash thoroughly, paying special attention to the area where your surgery        will be performed.  7.  Thoroughly rinse your body with warm water from the neck down.  8.  DO NOT shower/wash with your normal soap after using and rinsing off       the CHG Soap.  9.  Pat yourself dry with a clean towel.            10.  Wear clean pajamas.            11.  Place clean sheets on your bed the night of your first shower and do not        sleep with pets.  Day of Surgery  Do not apply any lotions/deoderants the morning of surgery.  Please wear clean clothes to the hospital/surgery center.    Please read over the following fact sheets that you were given. Surgical Site Infection Prevention

## 2016-12-29 ENCOUNTER — Encounter (HOSPITAL_COMMUNITY): Payer: Self-pay | Admitting: Anesthesiology
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
## 2016-12-30 ENCOUNTER — Encounter (HOSPITAL_COMMUNITY): Payer: Self-pay | Admitting: General Practice

## 2016-12-30 ENCOUNTER — Ambulatory Visit (HOSPITAL_COMMUNITY): Payer: Medicare Other | Admitting: Certified Registered Nurse Anesthetist

## 2016-12-30 ENCOUNTER — Ambulatory Visit (HOSPITAL_COMMUNITY)
Admission: RE | Admit: 2016-12-30 | Discharge: 2016-12-30 | Disposition: A | Discharge status: Home / Self Care | Payer: Medicare Other | Source: Ambulatory Visit | Attending: Oral Surgery | Admitting: Oral Surgery

## 2016-12-30 ENCOUNTER — Encounter (HOSPITAL_COMMUNITY)
Admission: RE | Disposition: A | Discharge status: Home / Self Care | Payer: Self-pay | Source: Ambulatory Visit | Attending: Oral Surgery

## 2016-12-30 DIAGNOSIS — Z87442 Personal history of urinary calculi: Secondary | ICD-10-CM | POA: Insufficient documentation

## 2016-12-30 DIAGNOSIS — F419 Anxiety disorder, unspecified: Secondary | ICD-10-CM | POA: Diagnosis not present

## 2016-12-30 DIAGNOSIS — F909 Attention-deficit hyperactivity disorder, unspecified type: Secondary | ICD-10-CM | POA: Insufficient documentation

## 2016-12-30 DIAGNOSIS — K0889 Other specified disorders of teeth and supporting structures: Secondary | ICD-10-CM | POA: Insufficient documentation

## 2016-12-30 DIAGNOSIS — Z881 Allergy status to other antibiotic agents status: Secondary | ICD-10-CM | POA: Insufficient documentation

## 2016-12-30 DIAGNOSIS — Z79899 Other long term (current) drug therapy: Secondary | ICD-10-CM | POA: Insufficient documentation

## 2016-12-30 DIAGNOSIS — Z91013 Allergy to seafood: Secondary | ICD-10-CM | POA: Insufficient documentation

## 2016-12-30 DIAGNOSIS — G8929 Other chronic pain: Secondary | ICD-10-CM | POA: Insufficient documentation

## 2016-12-30 DIAGNOSIS — Z885 Allergy status to narcotic agent status: Secondary | ICD-10-CM | POA: Insufficient documentation

## 2016-12-30 DIAGNOSIS — M270 Developmental disorders of jaws: Secondary | ICD-10-CM | POA: Diagnosis not present

## 2016-12-30 HISTORY — PX: TOOTH EXTRACTION: SHX859

## 2016-12-30 SURGERY — EXTRACTION, TOOTH, MOLAR
Anesthesia: General | Laterality: Bilateral

## 2016-12-30 MED ORDER — LIDOCAINE-EPINEPHRINE 2 %-1:100000 IJ SOLN
INTRAMUSCULAR | Status: DC | PRN
  Administered 2016-12-30: 13 mL

## 2016-12-30 MED ORDER — MIDAZOLAM HCL 5 MG/5ML IJ SOLN
INTRAMUSCULAR | Status: DC | PRN
  Administered 2016-12-30: 2 mg via INTRAVENOUS

## 2016-12-30 MED ORDER — PROMETHAZINE HCL 25 MG/ML IJ SOLN: 6.2500 mg | INTRAMUSCULAR | Status: DC | PRN

## 2016-12-30 MED ORDER — OXYCODONE-ACETAMINOPHEN 5-325 MG PO TABS: 1.0000 | ORAL_TABLET | ORAL | 0 refills | Status: DC | PRN

## 2016-12-30 MED ORDER — OXYMETAZOLINE HCL 0.05 % NA SOLN
NASAL | Status: DC | PRN
  Administered 2016-12-30 (×2): 1 via NASAL

## 2016-12-30 MED ORDER — OXYMETAZOLINE HCL 0.05 % NA SOLN
NASAL | Status: AC
  Filled 2016-12-30: qty 30

## 2016-12-30 MED ORDER — DEXAMETHASONE SODIUM PHOSPHATE 10 MG/ML IJ SOLN
INTRAMUSCULAR | Status: DC | PRN
  Administered 2016-12-30: 10 mg via INTRAVENOUS

## 2016-12-30 MED ORDER — HYDROCODONE-ACETAMINOPHEN 7.5-325 MG PO TABS: 1.0000 | ORAL_TABLET | Freq: Once | ORAL | Status: DC | PRN

## 2016-12-30 MED ORDER — CEFAZOLIN SODIUM-DEXTROSE 2-4 GM/100ML-% IV SOLN
2.0000 g | Freq: Once | INTRAVENOUS | Status: AC
  Administered 2016-12-30: 2 g via INTRAVENOUS

## 2016-12-30 MED ORDER — 0.9 % SODIUM CHLORIDE (POUR BTL) OPTIME
TOPICAL | Status: DC | PRN
  Administered 2016-12-30: 1000 mL

## 2016-12-30 MED ORDER — ONDANSETRON HCL 4 MG/5ML PO SOLN: 4.0000 mg | Freq: Two times a day (BID) | ORAL | 0 refills | Status: DC | PRN

## 2016-12-30 MED ORDER — LACTATED RINGERS IV SOLN
INTRAVENOUS | Status: DC | PRN
  Administered 2016-12-30: 07:00:00 via INTRAVENOUS

## 2016-12-30 MED ORDER — HYDROMORPHONE HCL 1 MG/ML IJ SOLN: 0.2500 mg | INTRAMUSCULAR | Status: DC | PRN

## 2016-12-30 MED ORDER — LIDOCAINE HCL (CARDIAC) 20 MG/ML IV SOLN
INTRAVENOUS | Status: DC | PRN
  Administered 2016-12-30: 40 mg via INTRAVENOUS

## 2016-12-30 MED ORDER — MIDAZOLAM HCL 2 MG/2ML IJ SOLN
INTRAMUSCULAR | Status: AC
  Filled 2016-12-30: qty 2

## 2016-12-30 MED ORDER — PROPOFOL 10 MG/ML IV BOLUS
INTRAVENOUS | Status: DC | PRN
  Administered 2016-12-30: 16 mg via INTRAVENOUS

## 2016-12-30 MED ORDER — FENTANYL CITRATE (PF) 100 MCG/2ML IJ SOLN
INTRAMUSCULAR | Status: DC | PRN
  Administered 2016-12-30: 50 ug via INTRAVENOUS
  Administered 2016-12-30: 100 ug via INTRAVENOUS

## 2016-12-30 MED ORDER — FENTANYL CITRATE (PF) 250 MCG/5ML IJ SOLN
INTRAMUSCULAR | Status: AC
  Filled 2016-12-30: qty 5

## 2016-12-30 MED ORDER — ONDANSETRON HCL 4 MG/2ML IJ SOLN
INTRAMUSCULAR | Status: DC | PRN
  Administered 2016-12-30: 4 mg via INTRAVENOUS

## 2016-12-30 MED ORDER — ROCURONIUM BROMIDE 100 MG/10ML IV SOLN
INTRAVENOUS | Status: DC | PRN
  Administered 2016-12-30: 40 mg via INTRAVENOUS

## 2016-12-30 MED ORDER — OXYMETAZOLINE HCL 0.05 % NA SOLN
NASAL | Status: AC
  Filled 2016-12-30: qty 15

## 2016-12-30 MED ORDER — LIDOCAINE-EPINEPHRINE 1 %-1:100000 IJ SOLN
INTRAMUSCULAR | Status: AC
  Filled 2016-12-30: qty 1

## 2016-12-30 MED ORDER — PROPOFOL 10 MG/ML IV BOLUS
INTRAVENOUS | Status: AC
  Filled 2016-12-30: qty 20

## 2016-12-30 MED ORDER — SODIUM CHLORIDE 0.9 % IR SOLN
Status: DC | PRN
  Administered 2016-12-30: 1000 mL

## 2016-12-30 MED ORDER — CEFAZOLIN SODIUM-DEXTROSE 2-4 GM/100ML-% IV SOLN
INTRAVENOUS | Status: AC
  Filled 2016-12-30: qty 100

## 2016-12-30 MED ORDER — MEPERIDINE HCL 25 MG/ML IJ SOLN: 6.2500 mg | INTRAMUSCULAR | Status: DC | PRN

## 2016-12-30 MED ORDER — SUGAMMADEX SODIUM 200 MG/2ML IV SOLN
INTRAVENOUS | Status: DC | PRN
  Administered 2016-12-30: 200 mg via INTRAVENOUS

## 2016-12-30 MED ORDER — LIDOCAINE-EPINEPHRINE 2 %-1:100000 IJ SOLN
INTRAMUSCULAR | Status: AC
  Filled 2016-12-30: qty 4

## 2016-12-30 SURGICAL SUPPLY — 28 items
BUR CROSS CUT FISSURE 1.6 (BURR) ×2 IMPLANT
BUR CROSS CUT FISSURE 1.6MM (BURR) ×1
BUR EGG ELITE 4.0 (BURR) ×1 IMPLANT
BUR EGG ELITE 4.0MM (BURR) ×1
CANISTER SUCT 3000ML PPV (MISCELLANEOUS) ×3 IMPLANT
COVER SURGICAL LIGHT HANDLE (MISCELLANEOUS) ×3 IMPLANT
DECANTER SPIKE VIAL GLASS SM (MISCELLANEOUS) ×2 IMPLANT
GAUZE PACKING FOLDED 2  STR (GAUZE/BANDAGES/DRESSINGS) ×2
GAUZE PACKING FOLDED 2 STR (GAUZE/BANDAGES/DRESSINGS) ×1 IMPLANT
GLOVE BIO SURGEON STRL SZ 6.5 (GLOVE) ×2 IMPLANT
GLOVE BIO SURGEON STRL SZ7.5 (GLOVE) ×3 IMPLANT
GLOVE BIO SURGEONS STRL SZ 6.5 (GLOVE) ×1
GLOVE BIOGEL PI IND STRL 7.0 (GLOVE) ×1 IMPLANT
GLOVE BIOGEL PI INDICATOR 7.0 (GLOVE) ×2
GOWN STRL REUS W/ TWL LRG LVL3 (GOWN DISPOSABLE) ×1 IMPLANT
GOWN STRL REUS W/ TWL XL LVL3 (GOWN DISPOSABLE) ×1 IMPLANT
GOWN STRL REUS W/TWL LRG LVL3 (GOWN DISPOSABLE) ×3
GOWN STRL REUS W/TWL XL LVL3 (GOWN DISPOSABLE) ×3
KIT BASIN OR (CUSTOM PROCEDURE TRAY) ×3 IMPLANT
KIT ROOM TURNOVER OR (KITS) ×3 IMPLANT
NEEDLE 22X1 1/2 (OR ONLY) (NEEDLE) ×5 IMPLANT
NS IRRIG 1000ML POUR BTL (IV SOLUTION) ×3 IMPLANT
PAD ARMBOARD 7.5X6 YLW CONV (MISCELLANEOUS) ×6 IMPLANT
SUT CHROMIC 3 0 PS 2 (SUTURE) ×6 IMPLANT
SYR CONTROL 10ML LL (SYRINGE) ×5 IMPLANT
TRAY ENT MC OR (CUSTOM PROCEDURE TRAY) ×3 IMPLANT
TUBING IRRIGATION (MISCELLANEOUS) ×2 IMPLANT
YANKAUER SUCT BULB TIP NO VENT (SUCTIONS) ×3 IMPLANT

## 2016-12-30 NOTE — Anesthesia Procedure Notes (Signed)
Procedure Name: Intubation Date/Time: 12/30/2016 7:43 AM Performed by: Shirlyn Goltz Pre-anesthesia Checklist: Patient identified, Suction available, Patient being monitored and Emergency Drugs available Patient Re-evaluated:Patient Re-evaluated prior to inductionOxygen Delivery Method: Circle system utilized Preoxygenation: Pre-oxygenation with 100% oxygen Intubation Type: IV induction Ventilation: Mask ventilation without difficulty Laryngoscope Size: Mac and 4 Grade View: Grade I Nasal Tubes: Left and Magill forceps- large, utilized Tube size: 6.5 mm Number of attempts: 2 Placement Confirmation: ETT inserted through vocal cords under direct vision,  positive ETCO2 and breath sounds checked- equal and bilateral Tube secured with: Tape Dental Injury: Teeth and Oropharynx as per pre-operative assessment

## 2016-12-30 NOTE — Op Note (Signed)
NAME:  Erik Strong, Erik Strong                       ACCOUNT NO.:  MEDICAL RECORD NO.:  [id number]  LOCATION:                                 FACILITY:  PHYSICIAN:  Gae Bon, M.D.       DATE OF BIRTH:  DATE OF PROCEDURE:  12/30/2016 DATE OF DISCHARGE:                              OPERATIVE REPORT   PREOPERATIVE DIAGNOSIS:  Nonrestorable teeth numbers 4, 5, 9, 26, and 30, bilateral mandibular lingual tori.  POSTOPERATIVE DIAGNOSIS:  Nonrestorable teeth numbers 4, 5, 9, 26, and 30, bilateral mandibular lingual tori.  PROCEDURES:  Extraction of teeth numbers 4, 5, 9, 26, and 30, removal of bilateral mandibular lingual tori.  SURGEON:  Gae Bon, M.D.  ANESTHESIA:  General nasal intubation, Dr. Royce Macadamia attending.  DESCRIPTION OF PROCEDURE:  The patient was taken to the operating room, placed on the table in supine position.  General anesthesia was administered intravenously.  The nasal endotracheal tube was placed and secured.  The eyes were protected.  The patient was draped for the procedure.  Time-out was performed.  The posterior pharynx was suctioned.  A throat pack was placed.  A 2% lidocaine with 1:100,000 epinephrine was infiltrated in an inferior alveolar block on the right and left sides and buccal and palatal infiltration in the maxilla around teeth numbers 4 and 5.  Then a bite block was placed on the right side of the mouth and a sweetheart retractor was used to retract the tongue. A #15 blade was used to make an incision around tooth #9, which was a residual root underneath a cemented bridge from teeth numbers 7 through 10.  The incision was made both palatally and buccally.  The tooth was elevated.  Bone was removed and then the tooth was delivered to the buccal using a 301 elevator, then the area was irrigated and closed with 3-0 chromic.  Then in the mandible, a 15 blade was used to make a lingual incision beginning on the crest of the alveolar ridge.   The posterior mandible carried forward lingually around the teeth in the gingival sulcus until tooth #24 was encountered.  Then the periosteum was reflected carefully to avoid tearing the tissues to expose the lingual tori and then using a Seldin retractor to protect the lingual tissues and a Stryker handpiece with the egg-shaped bur, the tori was removed.  Then, it was further smoothed with a bone file, then irrigated and closed with 3-0 chromic.  Then the bite block and sweetheart retractor repositioned inside of the mouth.  An incision was made around teeth numbers 4, 5, 26, and 30.  A lingual incision was created in the mandible for removal of the tori through the gingival sulcus of the right lower teeth.  Then the periosteum was reflected.  Teeth numbers 4 and 5 were elevated with 3-0 elevator and removed from the mouth with the dental forceps.  Tooth #26 was removed using the Asch forceps. Tooth #30 was removed after elevating with a 301 elevator with the #23 forceps.  The sockets were curetted.  Then the periosteum was reflected from around the right lingual torus,  using the periosteal elevator.  The Seldin retractor was used to protect the lingual tissues and the right lingual torus was removed using the Stryker handpiece and then the bone file was used to further smooth the area in the most proximal portion of the torus.  The lingual tissue was perforated slightly with a drill, which caused a 1 cm laceration of the floor of the mouth.  After closing the extraction sites with 3-0 chromic and the torus site with 3-0 chromic, the laceration of the tongue was closed with 3-0 chromic as well.  Then, the oral cavity was irrigated, suctioned.  A throat pack was removed.  The patient was awakened and taken to the recovery room by anesthesia.  ESTIMATED BLOOD LOSS:  Minimal.  COMPLICATIONS:  Laceration right floor of mouth sutured primarily. Counts were correct.     Gae Bon, M.D.     SMJ/MEDQ  D:  12/30/2016  T:  12/30/2016  Job:  [id number] [phone]

## 2016-12-30 NOTE — H&P (Signed)
H&P documentation  -History and Physical Reviewed  -Patient has been re-examined  -No change in the plan of care  Erik Strong M  

## 2016-12-30 NOTE — Transfer of Care (Signed)
Immediate Anesthesia Transfer of Care Note  Patient: Erik Strong  Procedure(s) Performed: Procedure(s) with comments: EXTRACTION MOLARS (Bilateral) - Extraction of teeth four, five, twenty-six, thirty; removal of bilateral mandibular lingual tori  Patient Location: PACU  Anesthesia Type:General  Level of Consciousness: awake, alert , oriented and patient cooperative  Airway & Oxygen Therapy: Patient Spontanous Breathing and Patient connected to face mask oxygen  Post-op Assessment: Report given to RN and Post -op Vital signs reviewed and stable  Post vital signs: Reviewed and stable  Last Vitals:  Vitals:   12/30/16 0623  BP: 124/75  Pulse: (!) 54  Resp: 20  Temp: 36.8 C    Last Pain:  Vitals:   12/30/16 0628  TempSrc:   PainSc: 5       Patients Stated Pain Goal: 3 (24/81/85 9093)  Complications: No apparent anesthesia complications

## 2016-12-30 NOTE — Anesthesia Preprocedure Evaluation (Addendum)
Anesthesia Evaluation  Patient identified by MRN, date of birth, ID band Patient awake    Reviewed: Allergy & Precautions, NPO status , Patient's Chart, lab work & pertinent test results  Airway Mallampati: I  TM Distance: >3 FB Neck ROM: Full    Dental  (+) Poor Dentition   Pulmonary Current Smoker,    Pulmonary exam normal breath sounds clear to auscultation       Cardiovascular negative cardio ROS Normal cardiovascular exam Rhythm:Regular Rate:Normal     Neuro/Psych Anxiety ADHDHx/o TBI age 56- poor memory    GI/Hepatic (+)     substance abuse  marijuana use, Non restorable teeth Dental caries   Endo/Other  negative endocrine ROS  Renal/GU   negative genitourinary   Musculoskeletal  (+) Arthritis ,   Abdominal   Peds  Hematology negative hematology ROS (+)   Anesthesia Other Findings   Reproductive/Obstetrics                            Anesthesia Physical Anesthesia Plan  ASA: II  Anesthesia Plan: General   Post-op Pain Management:    Induction: Intravenous  Airway Management Planned: Nasal ETT  Additional Equipment:   Intra-op Plan:   Post-operative Plan: Extubation in OR  Informed Consent: I have reviewed the patients History and Physical, chart, labs and discussed the procedure including the risks, benefits and alternatives for the proposed anesthesia with the patient or authorized representative who has indicated his/her understanding and acceptance.   Dental advisory given  Plan Discussed with: Anesthesiologist, CRNA and Surgeon  Anesthesia Plan Comments:         Anesthesia Quick Evaluation

## 2016-12-30 NOTE — Anesthesia Postprocedure Evaluation (Signed)
Anesthesia Post Note  Patient: Erik Strong  Procedure(s) Performed: Procedure(s) (LRB): EXTRACTION MOLARS (Bilateral)  Patient location during evaluation: PACU Anesthesia Type: General Level of consciousness: awake and alert Pain management: pain level controlled Vital Signs Assessment: post-procedure vital signs reviewed and stable Respiratory status: spontaneous breathing, nonlabored ventilation and respiratory function stable Cardiovascular status: blood pressure returned to baseline and stable Postop Assessment: no signs of nausea or vomiting Anesthetic complications: no       Last Vitals:  Vitals:   12/30/16 0845 12/30/16 0900  BP:  (!) 159/98  Pulse: 65 63  Resp: 18 12  Temp:  36.2 C    Last Pain:  Vitals:   12/30/16 0628  TempSrc:   PainSc: 5                  [name]

## 2016-12-30 NOTE — Op Note (Signed)
12/30/2016  8:15 AM  PATIENT:  Erik Strong  56 y.o. male  PRE-OPERATIVE DIAGNOSIS:  NON RESTORABLE TEETH # 4, 5, 9, 26, [id number], BILATERAL MANDIBULAR TORI   POST-OPERATIVE DIAGNOSIS:  SAME  PROCEDURE:  Procedure(s): EXTRACTION MOLARS  SURGEON:  Surgeon(s): Diona Browner, DDS  ANESTHESIA:   local and general  EBL:  minimal  DRAINS: none   SPECIMEN:  No Specimen  COUNTS:  YES  PLAN OF CARE: Discharge to home after PACU  PATIENT DISPOSITION:  PACU - hemodynamically stable.   PROCEDURE DETAILS: Dictation #  Gae Bon, DMD 12/30/2016 8:15 AM

## 2016-12-30 NOTE — Anesthesia Procedure Notes (Signed)
Performed by: Shirlyn Goltz

## 2016-12-31 ENCOUNTER — Encounter (HOSPITAL_COMMUNITY): Payer: Self-pay | Admitting: Oral Surgery

## 2017-12-25 ENCOUNTER — Ambulatory Visit (INDEPENDENT_AMBULATORY_CARE_PROVIDER_SITE_OTHER): Payer: Medicare Other | Admitting: Physician Assistant

## 2017-12-25 ENCOUNTER — Encounter (INDEPENDENT_AMBULATORY_CARE_PROVIDER_SITE_OTHER): Payer: Self-pay | Admitting: Physician Assistant

## 2017-12-25 ENCOUNTER — Ambulatory Visit (INDEPENDENT_AMBULATORY_CARE_PROVIDER_SITE_OTHER): Payer: Medicare Other

## 2017-12-25 DIAGNOSIS — M545 Low back pain: Secondary | ICD-10-CM

## 2017-12-25 MED ORDER — CYCLOBENZAPRINE HCL 10 MG PO TABS: 10.0000 mg | ORAL_TABLET | Freq: Three times a day (TID) | ORAL | 1 refills | Status: DC | PRN

## 2017-12-25 MED ORDER — METHYLPREDNISOLONE 4 MG PO TABS: ORAL_TABLET | ORAL | 0 refills | Status: DC

## 2017-12-25 MED ORDER — HYDROCODONE-ACETAMINOPHEN 5-325 MG PO TABS: 1.0000 | ORAL_TABLET | Freq: Four times a day (QID) | ORAL | 0 refills | Status: DC | PRN
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
## 2018-01-02 ENCOUNTER — Ambulatory Visit: Payer: Medicare Other | Admitting: Physical Therapy

## 2018-01-10 ENCOUNTER — Encounter (INDEPENDENT_AMBULATORY_CARE_PROVIDER_SITE_OTHER): Payer: Self-pay | Admitting: Physician Assistant

## 2018-01-10 ENCOUNTER — Ambulatory Visit (INDEPENDENT_AMBULATORY_CARE_PROVIDER_SITE_OTHER): Payer: Medicare Other | Admitting: Physician Assistant

## 2018-01-10 DIAGNOSIS — M545 Low back pain: Secondary | ICD-10-CM
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
## 2018-01-16 ENCOUNTER — Ambulatory Visit: Payer: Medicare Other | Attending: Physician Assistant | Admitting: Physical Therapy

## 2018-01-16 ENCOUNTER — Encounter: Payer: Self-pay | Admitting: Physical Therapy

## 2018-01-16 DIAGNOSIS — R252 Cramp and spasm: Secondary | ICD-10-CM | POA: Diagnosis present

## 2018-01-16 DIAGNOSIS — M545 Low back pain, unspecified: Secondary | ICD-10-CM

## 2018-01-16 DIAGNOSIS — R293 Abnormal posture: Secondary | ICD-10-CM | POA: Insufficient documentation
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
## 2018-01-16 NOTE — Patient Instructions (Signed)
Access Code: [email or web address]  URL: [email or web address]  Date: 01/16/2018  Prepared by: Raeford Razor   Exercises  Supine Lower Trunk Rotation - 10 reps - 2 sets - 10 hold - 1x daily - 7x weekly  Sidelying Thoracolumbar Mobilisation with Towel Roll - 10 reps - 2 sets - 30 hold - 1x daily - 7x weekly  Child's Pose Stretch - 3 reps - 1 sets - 30 hold - 1x daily - 7x weekly  Child's Pose with Sidebending - 3 reps - 1 sets - 30 hold - 1x daily - 7x weekly  Supine Hamstring Stretch with Strap - 3 reps - 1 sets - 30 hold - 1x daily - 7x weekly  Seated Hamstring Stretch - 3 reps - 1 sets - 30 hold - 1x daily - 7x weekly

## 2018-01-25 ENCOUNTER — Encounter: Payer: Self-pay | Admitting: Physical Therapy

## 2018-01-25 ENCOUNTER — Ambulatory Visit: Payer: Medicare Other | Attending: Physician Assistant | Admitting: Physical Therapy

## 2018-01-25 DIAGNOSIS — M545 Low back pain, unspecified: Secondary | ICD-10-CM

## 2018-01-25 DIAGNOSIS — R293 Abnormal posture: Secondary | ICD-10-CM | POA: Insufficient documentation

## 2018-01-25 DIAGNOSIS — R252 Cramp and spasm: Secondary | ICD-10-CM | POA: Insufficient documentation
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
## 2018-01-26 ENCOUNTER — Encounter

## 2018-01-29 ENCOUNTER — Ambulatory Visit: Payer: Medicare Other | Admitting: Physical Therapy

## 2018-01-29 ENCOUNTER — Encounter: Payer: Self-pay | Admitting: Physical Therapy

## 2018-01-29 DIAGNOSIS — R252 Cramp and spasm: Secondary | ICD-10-CM

## 2018-01-29 DIAGNOSIS — M545 Low back pain, unspecified: Secondary | ICD-10-CM

## 2018-01-29 DIAGNOSIS — R293 Abnormal posture: Secondary | ICD-10-CM
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
## 2018-02-01 ENCOUNTER — Ambulatory Visit: Payer: Medicare Other | Admitting: Physical Therapy

## 2018-02-01 ENCOUNTER — Encounter: Payer: Self-pay | Admitting: Physical Therapy

## 2018-02-01 DIAGNOSIS — R252 Cramp and spasm: Secondary | ICD-10-CM

## 2018-02-01 DIAGNOSIS — M545 Low back pain, unspecified: Secondary | ICD-10-CM

## 2018-02-01 DIAGNOSIS — R293 Abnormal posture: Secondary | ICD-10-CM
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
## 2018-02-05 ENCOUNTER — Ambulatory Visit: Payer: Medicare Other | Admitting: Physical Therapy

## 2018-02-08 ENCOUNTER — Encounter: Payer: Self-pay | Admitting: Physical Therapy

## 2018-02-08 ENCOUNTER — Ambulatory Visit: Payer: Medicare Other | Admitting: Physical Therapy

## 2018-02-08 DIAGNOSIS — R252 Cramp and spasm: Secondary | ICD-10-CM

## 2018-02-08 DIAGNOSIS — M545 Low back pain, unspecified: Secondary | ICD-10-CM

## 2018-02-08 DIAGNOSIS — R293 Abnormal posture: Secondary | ICD-10-CM
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
## 2018-02-13 ENCOUNTER — Ambulatory Visit: Payer: Medicare Other | Admitting: Physical Therapy

## 2018-02-13 ENCOUNTER — Telehealth: Payer: Self-pay | Admitting: Physical Therapy

## 2018-02-13 NOTE — Telephone Encounter (Signed)
Spoke with pt regarding missed appointment today at 10:15. He stated he went camping and stayed an extra day and it threw off his days and he also lost his phone with his calendar in it. I reminded him of his next appointment time and he noted it and stated he would be here.

## 2018-02-15 ENCOUNTER — Ambulatory Visit: Payer: Medicare Other | Admitting: Physical Therapy

## 2018-02-15 ENCOUNTER — Encounter: Payer: Self-pay | Admitting: Physical Therapy

## 2018-02-15 DIAGNOSIS — M545 Low back pain, unspecified: Secondary | ICD-10-CM

## 2018-02-15 DIAGNOSIS — R252 Cramp and spasm: Secondary | ICD-10-CM

## 2018-02-15 DIAGNOSIS — R293 Abnormal posture: Secondary | ICD-10-CM
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
## 2018-02-19 ENCOUNTER — Ambulatory Visit: Payer: Medicare Other | Attending: Physician Assistant | Admitting: Physical Therapy

## 2018-02-19 DIAGNOSIS — R252 Cramp and spasm: Secondary | ICD-10-CM | POA: Insufficient documentation

## 2018-02-19 DIAGNOSIS — M545 Low back pain: Secondary | ICD-10-CM | POA: Insufficient documentation

## 2018-02-19 DIAGNOSIS — R293 Abnormal posture: Secondary | ICD-10-CM | POA: Insufficient documentation

## 2018-02-22 ENCOUNTER — Ambulatory Visit: Payer: Medicare Other | Admitting: Physical Therapy

## 2018-02-22 ENCOUNTER — Encounter: Payer: Self-pay | Admitting: Physical Therapy

## 2018-02-22 DIAGNOSIS — R252 Cramp and spasm: Secondary | ICD-10-CM

## 2018-02-22 DIAGNOSIS — M545 Low back pain, unspecified: Secondary | ICD-10-CM

## 2018-02-22 DIAGNOSIS — R293 Abnormal posture: Secondary | ICD-10-CM | POA: Diagnosis present
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
## 2018-05-15 ENCOUNTER — Emergency Department (HOSPITAL_COMMUNITY)
Admission: EM | Admit: 2018-05-15 | Discharge: 2018-05-15 | Disposition: A | Discharge status: Home / Self Care | Payer: Medicare Other | Attending: Emergency Medicine | Admitting: Emergency Medicine

## 2018-05-15 ENCOUNTER — Other Ambulatory Visit: Payer: Self-pay

## 2018-05-15 ENCOUNTER — Emergency Department (HOSPITAL_COMMUNITY): Payer: Medicare Other

## 2018-05-15 ENCOUNTER — Encounter (HOSPITAL_COMMUNITY): Payer: Self-pay | Admitting: Emergency Medicine

## 2018-05-15 DIAGNOSIS — Y92017 Garden or yard in single-family (private) house as the place of occurrence of the external cause: Secondary | ICD-10-CM | POA: Diagnosis not present

## 2018-05-15 DIAGNOSIS — Y999 Unspecified external cause status: Secondary | ICD-10-CM | POA: Insufficient documentation

## 2018-05-15 DIAGNOSIS — S0990XA Unspecified injury of head, initial encounter: Secondary | ICD-10-CM | POA: Diagnosis present

## 2018-05-15 DIAGNOSIS — F17210 Nicotine dependence, cigarettes, uncomplicated: Secondary | ICD-10-CM | POA: Diagnosis not present

## 2018-05-15 DIAGNOSIS — W240XXA Contact with lifting devices, not elsewhere classified, initial encounter: Secondary | ICD-10-CM | POA: Insufficient documentation

## 2018-05-15 DIAGNOSIS — Y93H9 Activity, other involving exterior property and land maintenance, building and construction: Secondary | ICD-10-CM | POA: Insufficient documentation

## 2018-05-15 DIAGNOSIS — Z79899 Other long term (current) drug therapy: Secondary | ICD-10-CM | POA: Insufficient documentation

## 2018-05-15 NOTE — ED Triage Notes (Signed)
Pt states about 3 hours ago he was working with a jack to lift up a floor and somehow the jack sprung up and hit him in the left temple. Pt states he remembers the whole event but right after the event he had some blurry vision that still "seems a little wonky". Pt states he feels a little confused, pt is alert and ox4. No n/v.

## 2018-05-15 NOTE — ED Provider Notes (Signed)
Patient placed in Quick Look pathway, seen and evaluated   Chief Complaint: Headache  HPI:   Patient presents with headache. 2 hours ago, he was using a jack to lift flooring, the jack "sprang back" and struck him in the head. Denies LOC. Initially dizzy with some blurred vission, which has resolved. Feels like his gait is unsteady and has slightly blurred vision. Denies numbness or tingling, nausea, vomiting. Feels confused/disoriented. Denies bloodthinners.  Notes left-sided headache with "a big goose egg ".  Tetanus up-to-date  ROS: Positive for headache, blurry vision, dizziness Negative for syncope, numbness, weakness, nausea, vomiting  Physical Exam:   Gen: No distress  Neuro: Awake and Alert  Skin: Warm    Focused Exam: Left forehead with superficial abrasion and swelling, tender to palpation overlying this area, no underlying crepitus.  No active bleeding.  Cranial nerves II through XII tested and intact.  5/5 strength of BUE and BLE major muscle groups.  Ambulatory without difficulty exhibits good gait and balance.  Able to Heel Walk and Toe Walk without difficulty.  Romberg sign absent.  Alert and oriented x3.   Initiation of care has begun. The patient has been counseled on the process, plan, and necessity for staying for the completion/evaluation, and the remainder of the medical screening examination    Debroah Baller 05/15/18 1511    Jola Schmidt, MD 05/15/18 [phone]
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
## 2018-05-15 NOTE — Discharge Instructions (Addendum)
Please read attached information. If you experience any new or worsening signs or symptoms please return to the emergency room for evaluation. Please follow-up with your primary care provider or specialist as discussed.  °

## 2019-03-28 ENCOUNTER — Other Ambulatory Visit: Payer: Self-pay

## 2019-03-28 ENCOUNTER — Emergency Department (HOSPITAL_COMMUNITY): Payer: Medicare Other

## 2019-03-28 ENCOUNTER — Encounter (HOSPITAL_COMMUNITY): Payer: Self-pay

## 2019-03-28 ENCOUNTER — Emergency Department (HOSPITAL_COMMUNITY)
Admission: EM | Admit: 2019-03-28 | Discharge: 2019-03-29 | Disposition: A | Discharge status: Home / Self Care | Payer: Medicare Other | Attending: Emergency Medicine | Admitting: Emergency Medicine

## 2019-03-28 DIAGNOSIS — R1032 Left lower quadrant pain: Secondary | ICD-10-CM | POA: Insufficient documentation

## 2019-03-28 DIAGNOSIS — Y929 Unspecified place or not applicable: Secondary | ICD-10-CM | POA: Insufficient documentation

## 2019-03-28 DIAGNOSIS — G8929 Other chronic pain: Secondary | ICD-10-CM | POA: Insufficient documentation

## 2019-03-28 DIAGNOSIS — Y999 Unspecified external cause status: Secondary | ICD-10-CM | POA: Insufficient documentation

## 2019-03-28 DIAGNOSIS — Y939 Activity, unspecified: Secondary | ICD-10-CM | POA: Diagnosis not present

## 2019-03-28 DIAGNOSIS — F17210 Nicotine dependence, cigarettes, uncomplicated: Secondary | ICD-10-CM | POA: Diagnosis not present

## 2019-03-28 DIAGNOSIS — W01190A Fall on same level from slipping, tripping and stumbling with subsequent striking against furniture, initial encounter: Secondary | ICD-10-CM | POA: Insufficient documentation

## 2019-03-28 DIAGNOSIS — M549 Dorsalgia, unspecified: Secondary | ICD-10-CM | POA: Insufficient documentation

## 2019-03-28 DIAGNOSIS — W19XXXA Unspecified fall, initial encounter: Secondary | ICD-10-CM

## 2019-03-28 DIAGNOSIS — S299XXA Unspecified injury of thorax, initial encounter: Secondary | ICD-10-CM | POA: Diagnosis present

## 2019-03-28 DIAGNOSIS — S2232XA Fracture of one rib, left side, initial encounter for closed fracture: Secondary | ICD-10-CM | POA: Diagnosis not present

## 2019-03-28 LAB — CBC WITH DIFFERENTIAL/PLATELET
Abs Immature Granulocytes: 0.11 10*3/uL — ABNORMAL HIGH (ref 0.00–0.07)
Basophils Absolute: 0.1 10*3/uL (ref 0.0–0.1)
Basophils Relative: 0 %
Eosinophils Absolute: 0 10*3/uL (ref 0.0–0.5)
Eosinophils Relative: 0 %
HCT: 48.5 % (ref 39.0–52.0)
Hemoglobin: 15.8 g/dL (ref 13.0–17.0)
Immature Granulocytes: 1 %
Lymphocytes Relative: 12 %
Lymphs Abs: 2 10*3/uL (ref 0.7–4.0)
MCH: 31.2 pg (ref 26.0–34.0)
MCHC: 32.6 g/dL (ref 30.0–36.0)
MCV: 95.7 fL (ref 80.0–100.0)
Monocytes Absolute: 0.9 10*3/uL (ref 0.1–1.0)
Monocytes Relative: 5 %
Neutro Abs: 14.2 10*3/uL — ABNORMAL HIGH (ref 1.7–7.7)
Neutrophils Relative %: 82 %
Platelets: 268 10*3/uL (ref 150–400)
RBC: 5.07 MIL/uL (ref 4.22–5.81)
RDW: 13.2 % (ref 11.5–15.5)
WBC: 17.4 10*3/uL — ABNORMAL HIGH (ref 4.0–10.5)
nRBC: 0 % (ref 0.0–0.2)

## 2019-03-28 LAB — COMPREHENSIVE METABOLIC PANEL
ALT: 32 U/L (ref 0–44)
AST: 30 U/L (ref 15–41)
Albumin: 4.1 g/dL (ref 3.5–5.0)
Alkaline Phosphatase: 63 U/L (ref 38–126)
Anion gap: 11 (ref 5–15)
BUN: 13 mg/dL (ref 6–20)
CO2: 22 mmol/L (ref 22–32)
Calcium: 9.3 mg/dL (ref 8.9–10.3)
Chloride: 106 mmol/L (ref 98–111)
Creatinine, Ser: 1.46 mg/dL — ABNORMAL HIGH (ref 0.61–1.24)
GFR calc Af Amer: >60 mL/min (ref >60)
GFR calc non Af Amer: 53 mL/min — ABNORMAL LOW (ref >60)
Glucose, Bld: 102 mg/dL — ABNORMAL HIGH (ref 70–99)
Potassium: 4.2 mmol/L (ref 3.5–5.1)
Sodium: 139 mmol/L (ref 135–145)
Total Bilirubin: 1 mg/dL (ref 0.3–1.2)
Total Protein: 7.1 g/dL (ref 6.5–8.1)

## 2019-03-28 MED ORDER — ONDANSETRON HCL 4 MG PO TABS: 4.0000 mg | ORAL_TABLET | Freq: Four times a day (QID) | ORAL | 0 refills | Status: AC

## 2019-03-28 MED ORDER — HYDROCODONE-ACETAMINOPHEN 5-325 MG PO TABS: 2.0000 | ORAL_TABLET | ORAL | 0 refills | Status: AC | PRN

## 2019-03-28 MED ORDER — HYDROCODONE-ACETAMINOPHEN 5-325 MG PO TABS
1.0000 | ORAL_TABLET | Freq: Once | ORAL | Status: AC
  Administered 2019-03-28: 1 via ORAL
  Filled 2019-03-28: qty 1

## 2019-03-28 MED ORDER — IOHEXOL 300 MG/ML  SOLN
100.0000 mL | Freq: Once | INTRAMUSCULAR | Status: AC | PRN
  Administered 2019-03-28: 100 mL via INTRAVENOUS

## 2019-03-28 MED ORDER — ONDANSETRON 4 MG PO TBDP
4.0000 mg | ORAL_TABLET | Freq: Once | ORAL | Status: AC
  Administered 2019-03-28: 4 mg via ORAL
  Filled 2019-03-28: qty 1

## 2019-03-28 MED ORDER — METHOCARBAMOL 500 MG PO TABS: 500.0000 mg | ORAL_TABLET | Freq: Two times a day (BID) | ORAL | 0 refills | Status: AC

## 2019-03-28 NOTE — ED Notes (Signed)
Pt has large bloody abrasion to left flank area.
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
## 2019-03-28 NOTE — ED Triage Notes (Signed)
Patient reports he was working and lost his balance falling onto a board, he has an abrasion to the LLQ, bleeding controlled. He also reports rib cage pain as well as back pain. Resp e/u. AOX4.

## 2019-03-28 NOTE — Discharge Instructions (Addendum)
I have provided the results to your CT. I have also provided medication to  help with your pain, please take this as needed. I have prescribed muscle relaxers for your pain, please do not drink or drive while taking this medications as they can make you drowsy.

## 2019-03-29 NOTE — ED Notes (Signed)
Patient verbalized understanding of dc instructions, vss, ambulatory with nad.   

## 2019-04-06 ENCOUNTER — Other Ambulatory Visit: Payer: Self-pay | Admitting: Family Medicine

## 2019-04-06 DIAGNOSIS — E278 Other specified disorders of adrenal gland: Secondary | ICD-10-CM

## 2019-04-28 ENCOUNTER — Other Ambulatory Visit: Payer: Self-pay

## 2019-04-28 ENCOUNTER — Ambulatory Visit
Admission: RE | Admit: 2019-04-28 | Discharge: 2019-04-28 | Disposition: A | Discharge status: Home / Self Care | Payer: Medicare Other | Source: Ambulatory Visit | Attending: Family Medicine | Admitting: Family Medicine

## 2019-04-28 DIAGNOSIS — E278 Other specified disorders of adrenal gland: Secondary | ICD-10-CM

## 2019-04-28 MED ORDER — GADOBENATE DIMEGLUMINE 529 MG/ML IV SOLN
20.0000 mL | Freq: Once | INTRAVENOUS | Status: AC | PRN
  Administered 2019-04-28: 11:00:00 20 mL via INTRAVENOUS

## 2019-05-09 ENCOUNTER — Other Ambulatory Visit: Payer: Self-pay

## 2019-05-09 ENCOUNTER — Ambulatory Visit: Payer: Medicare Other | Admitting: Internal Medicine

## 2019-05-09 ENCOUNTER — Encounter: Payer: Self-pay | Admitting: Internal Medicine

## 2019-05-09 VITALS — BP 120/82 | HR 84 | Temp 98.3°F | Ht 73.0 in | Wt 230.6 lb

## 2019-05-09 DIAGNOSIS — J84112 Idiopathic pulmonary fibrosis: Secondary | ICD-10-CM | POA: Diagnosis not present

## 2019-05-09 DIAGNOSIS — R0600 Dyspnea, unspecified: Secondary | ICD-10-CM

## 2019-05-09 DIAGNOSIS — J449 Chronic obstructive pulmonary disease, unspecified: Secondary | ICD-10-CM | POA: Diagnosis not present

## 2019-05-09 MED ORDER — BUDESONIDE-FORMOTEROL FUMARATE 160-4.5 MCG/ACT IN AERO: INHALATION_SPRAY | RESPIRATORY_TRACT | 12 refills | Status: DC

## 2019-05-09 NOTE — Assessment & Plan Note (Addendum)
Quit smoking 2018 then 25lb wt gain as of 1st pulm ov 05/09/2019  - 05/09/2019   Walked RA  2 laps @  approx 258ft each @ fast pace  stopped due to  End of study, no sob, sats 96%  - 05/09/2019  After extensive coaching inhaler device,  effectiveness =    90% try symb 160 2bid    When respiratory symptoms begin or become refractory well after a patient reports complete smoking cessation,  Especially when this wasn't the case while they were smoking, a red flag is raised based on the work of Dr Kris Mouton which states:  if you quit smoking when your best day FEV1 is still well preserved it is highly unlikely you will progress to severe disease.  That is to say, once the smoking stops,  the symptoms should not suddenly erupt or markedly worsen.  If so, the differential diagnosis should include  obesity/deconditioning,  LPR/Reflux/Aspiration syndromes,  occult CHF, or  especially side effect of medications commonly used in this population or alternative dx like PF or asbestosis     For now recommend we treated him as an asthmatic bronchitic with Symbicort 160 dose 2 puffs every 12 hours and bring him back for high resolution CT scan of the chest when he is not uncomfortable related to the rib fractures which would be in about a month and follow that up with full set of PFTs.   Discussed in detail all the  indications, usual  risks and alternatives  relative to the benefits with patient who agrees to proceed with w/u as outlined.

## 2019-05-09 NOTE — Assessment & Plan Note (Addendum)
Seen first on CT abd 7//9/20   Honeycombing is a nonspecific finding but in this setting is suggestive of early UIP.  DDx for pulmonary fibrosis  includes idiopathic pulmonary fibrosis, pulmonary fibrosis associated with rheumatologic diseases (which have a relatively benign course in most cases) , adverse effect from  drugs such as chemotherapy or amiodarone exposure, nonspecific interstitial pneumonia which is typically steroid responsive, and chronic hypersensitivity pneumonitis, and pneumoconiosis from construction work, esp asbestosis in his case.    In active  smokers Langerhan's Cell  Histiocyctosis (eosinophilic granuomatosis),  DIP,  and Respiratory Bronchiolitis ILD also need to be considered, but no longer apply here    We will sort through differential when he returns for high resolution CT scan of the chest and a full set of PFTs.   Total time devoted to counseling  > 50 % of initial 60 min office visit:  reviewed case with pt/ directly observed portions of ambulatory 02 saturation study/   performed device teaching  using a teach back technique which also  extended face to face time for this visit (see above) discussion of options/alternatives/ personally creating written customized instructions  in presence of pt  then going over those specific  Instructions directly with the pt including how to use all of the meds but in particular covering each new medication in detail and the difference between the maintenance= "automatic" meds and the prns using an action plan format for the latter (If this problem/symptom => do that organization reading Left to right).  Please see AVS from this visit for a full list of these instructions which I personally wrote for this pt and  are unique to this visit.
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
## 2019-05-09 NOTE — Patient Instructions (Addendum)
Symbicort 160 Take 2 puffs first thing in am and then another 2 puffs about 12 hours later.    Work on inhaler technique:  relax and gently blow all the way out then take a nice smooth deep breath back in, triggering the inhaler at same time you start breathing in.  Hold for up to 5 seconds if you can. Blow out thru nose. Rinse and gargle with water when done     We will contact you to set up  High resolution CT chest after Labor day.    Please schedule a follow up visit in 3 months but call sooner if needed with PFT on return - call sooner if losing ground with ex tolerance

## 2019-08-01 ENCOUNTER — Telehealth: Payer: Self-pay | Admitting: Internal Medicine

## 2019-08-02 NOTE — Telephone Encounter (Signed)
Printed & placed in folder to be sched.

## 2019-09-02 ENCOUNTER — Encounter (HOSPITAL_COMMUNITY)
Admission: EM | Disposition: A | Discharge status: Home / Self Care | Payer: Self-pay | Source: Home / Self Care | Attending: Emergency Medicine

## 2019-09-02 ENCOUNTER — Encounter (HOSPITAL_COMMUNITY): Payer: Self-pay

## 2019-09-02 ENCOUNTER — Emergency Department (HOSPITAL_COMMUNITY): Payer: Medicare Other

## 2019-09-02 ENCOUNTER — Other Ambulatory Visit: Payer: Self-pay

## 2019-09-02 ENCOUNTER — Emergency Department (HOSPITAL_COMMUNITY): Payer: Medicare Other | Admitting: Certified Registered Nurse Anesthetist

## 2019-09-02 ENCOUNTER — Observation Stay (HOSPITAL_COMMUNITY)
Admission: EM | Admit: 2019-09-02 | Discharge: 2019-09-03 | Disposition: A | Discharge status: Home / Self Care | Payer: Medicare Other | Attending: Orthopedic Surgery | Admitting: Orthopedic Surgery

## 2019-09-02 DIAGNOSIS — W5501XA Bitten by cat, initial encounter: Secondary | ICD-10-CM | POA: Diagnosis not present

## 2019-09-02 DIAGNOSIS — J84112 Idiopathic pulmonary fibrosis: Secondary | ICD-10-CM | POA: Insufficient documentation

## 2019-09-02 DIAGNOSIS — Z885 Allergy status to narcotic agent status: Secondary | ICD-10-CM | POA: Insufficient documentation

## 2019-09-02 DIAGNOSIS — M009 Pyogenic arthritis, unspecified: Principal | ICD-10-CM | POA: Diagnosis present

## 2019-09-02 DIAGNOSIS — J449 Chronic obstructive pulmonary disease, unspecified: Secondary | ICD-10-CM | POA: Insufficient documentation

## 2019-09-02 DIAGNOSIS — Z79899 Other long term (current) drug therapy: Secondary | ICD-10-CM | POA: Diagnosis not present

## 2019-09-02 DIAGNOSIS — N529 Male erectile dysfunction, unspecified: Secondary | ICD-10-CM | POA: Insufficient documentation

## 2019-09-02 DIAGNOSIS — M79645 Pain in left finger(s): Secondary | ICD-10-CM | POA: Diagnosis present

## 2019-09-02 DIAGNOSIS — Z7951 Long term (current) use of inhaled steroids: Secondary | ICD-10-CM | POA: Insufficient documentation

## 2019-09-02 DIAGNOSIS — J45909 Unspecified asthma, uncomplicated: Secondary | ICD-10-CM | POA: Diagnosis not present

## 2019-09-02 DIAGNOSIS — Z8249 Family history of ischemic heart disease and other diseases of the circulatory system: Secondary | ICD-10-CM | POA: Insufficient documentation

## 2019-09-02 DIAGNOSIS — F419 Anxiety disorder, unspecified: Secondary | ICD-10-CM | POA: Insufficient documentation

## 2019-09-02 DIAGNOSIS — M1990 Unspecified osteoarthritis, unspecified site: Secondary | ICD-10-CM | POA: Diagnosis not present

## 2019-09-02 DIAGNOSIS — A280 Pasteurellosis: Secondary | ICD-10-CM | POA: Insufficient documentation

## 2019-09-02 DIAGNOSIS — Z20828 Contact with and (suspected) exposure to other viral communicable diseases: Secondary | ICD-10-CM | POA: Insufficient documentation

## 2019-09-02 DIAGNOSIS — Z87891 Personal history of nicotine dependence: Secondary | ICD-10-CM | POA: Diagnosis not present

## 2019-09-02 DIAGNOSIS — F988 Other specified behavioral and emotional disorders with onset usually occurring in childhood and adolescence: Secondary | ICD-10-CM | POA: Diagnosis not present

## 2019-09-02 HISTORY — PX: I & D EXTREMITY: SHX5045

## 2019-09-02 LAB — CBC WITH DIFFERENTIAL/PLATELET
Abs Immature Granulocytes: 0.04 10*3/uL (ref 0.00–0.07)
Basophils Absolute: 0.1 10*3/uL (ref 0.0–0.1)
Basophils Relative: 0 %
Eosinophils Absolute: 0.1 10*3/uL (ref 0.0–0.5)
Eosinophils Relative: 1 %
HCT: 47.8 % (ref 39.0–52.0)
Hemoglobin: 15.8 g/dL (ref 13.0–17.0)
Immature Granulocytes: 0 %
Lymphocytes Relative: 24 %
Lymphs Abs: 3.2 10*3/uL (ref 0.7–4.0)
MCH: 31 pg (ref 26.0–34.0)
MCHC: 33.1 g/dL (ref 30.0–36.0)
MCV: 93.7 fL (ref 80.0–100.0)
Monocytes Absolute: 1.1 10*3/uL — ABNORMAL HIGH (ref 0.1–1.0)
Monocytes Relative: 8 %
Neutro Abs: 8.9 10*3/uL — ABNORMAL HIGH (ref 1.7–7.7)
Neutrophils Relative %: 67 %
Platelets: 326 10*3/uL (ref 150–400)
RBC: 5.1 MIL/uL (ref 4.22–5.81)
RDW: 13.2 % (ref 11.5–15.5)
WBC: 13.4 10*3/uL — ABNORMAL HIGH (ref 4.0–10.5)
nRBC: 0 % (ref 0.0–0.2)

## 2019-09-02 LAB — COMPREHENSIVE METABOLIC PANEL
ALT: 27 U/L (ref 0–44)
AST: 22 U/L (ref 15–41)
Albumin: 4.2 g/dL (ref 3.5–5.0)
Alkaline Phosphatase: 72 U/L (ref 38–126)
Anion gap: 11 (ref 5–15)
BUN: 14 mg/dL (ref 6–20)
CO2: 27 mmol/L (ref 22–32)
Calcium: 9.3 mg/dL (ref 8.9–10.3)
Chloride: 101 mmol/L (ref 98–111)
Creatinine, Ser: 1.45 mg/dL — ABNORMAL HIGH (ref 0.61–1.24)
GFR calc Af Amer: >60 mL/min (ref >60)
GFR calc non Af Amer: 53 mL/min — ABNORMAL LOW (ref >60)
Glucose, Bld: 93 mg/dL (ref 70–99)
Potassium: 3.7 mmol/L (ref 3.5–5.1)
Sodium: 139 mmol/L (ref 135–145)
Total Bilirubin: 1 mg/dL (ref 0.3–1.2)
Total Protein: 7.5 g/dL (ref 6.5–8.1)

## 2019-09-02 LAB — RESPIRATORY PANEL BY RT PCR (FLU A&B, COVID)
Influenza A by PCR: NEGATIVE
Influenza B by PCR: NEGATIVE
SARS Coronavirus 2 by RT PCR: NEGATIVE

## 2019-09-02 SURGERY — IRRIGATION AND DEBRIDEMENT EXTREMITY
Anesthesia: General | Laterality: Left

## 2019-09-02 MED ORDER — LIDOCAINE 2% (20 MG/ML) 5 ML SYRINGE
INTRAMUSCULAR | Status: DC | PRN
  Administered 2019-09-02: 60 mg via INTRAVENOUS

## 2019-09-02 MED ORDER — MOMETASONE FURO-FORMOTEROL FUM 200-5 MCG/ACT IN AERO
2.0000 | INHALATION_SPRAY | Freq: Two times a day (BID) | RESPIRATORY_TRACT | Status: DC
  Administered 2019-09-03: 2 via RESPIRATORY_TRACT
  Filled 2019-09-02: qty 8.8

## 2019-09-02 MED ORDER — DOCUSATE SODIUM 100 MG PO CAPS
100.0000 mg | ORAL_CAPSULE | Freq: Two times a day (BID) | ORAL | Status: DC
  Administered 2019-09-02 – 2019-09-03 (×2): 100 mg via ORAL
  Filled 2019-09-02 (×2): qty 1

## 2019-09-02 MED ORDER — AMPHETAMINE-DEXTROAMPHETAMINE 10 MG PO TABS
10.0000 mg | ORAL_TABLET | Freq: Every day | ORAL | Status: DC
  Administered 2019-09-03: 10 mg via ORAL
  Filled 2019-09-02: qty 1

## 2019-09-02 MED ORDER — PROPOFOL 10 MG/ML IV BOLUS
INTRAVENOUS | Status: DC | PRN
  Administered 2019-09-02: 40 mg via INTRAVENOUS
  Administered 2019-09-02: 160 mg via INTRAVENOUS

## 2019-09-02 MED ORDER — SUCCINYLCHOLINE CHLORIDE 200 MG/10ML IV SOSY
PREFILLED_SYRINGE | INTRAVENOUS | Status: AC
  Filled 2019-09-02: qty 20

## 2019-09-02 MED ORDER — OXYCODONE HCL 5 MG PO TABS
5.0000 mg | ORAL_TABLET | Freq: Four times a day (QID) | ORAL | Status: DC | PRN
  Administered 2019-09-03 (×2): 5 mg via ORAL
  Filled 2019-09-02 (×2): qty 1

## 2019-09-02 MED ORDER — ROCURONIUM BROMIDE 10 MG/ML (PF) SYRINGE
PREFILLED_SYRINGE | INTRAVENOUS | Status: AC
  Filled 2019-09-02: qty 10

## 2019-09-02 MED ORDER — ONDANSETRON HCL 4 MG/2ML IJ SOLN
4.0000 mg | Freq: Four times a day (QID) | INTRAMUSCULAR | Status: DC | PRN
  Administered 2019-09-03: 4 mg via INTRAVENOUS
  Filled 2019-09-02: qty 2

## 2019-09-02 MED ORDER — SENNOSIDES-DOCUSATE SODIUM 8.6-50 MG PO TABS: 1.0000 | ORAL_TABLET | Freq: Every evening | ORAL | Status: DC | PRN

## 2019-09-02 MED ORDER — TIZANIDINE HCL 4 MG PO TABS
2.0000 mg | ORAL_TABLET | Freq: Every day | ORAL | Status: DC
  Administered 2019-09-02: 2 mg via ORAL
  Filled 2019-09-02: qty 1

## 2019-09-02 MED ORDER — 0.9 % SODIUM CHLORIDE (POUR BTL) OPTIME
TOPICAL | Status: DC | PRN
  Administered 2019-09-02: 1000 mL

## 2019-09-02 MED ORDER — MIDAZOLAM HCL 2 MG/2ML IJ SOLN
INTRAMUSCULAR | Status: AC
  Filled 2019-09-02: qty 2

## 2019-09-02 MED ORDER — LEVOTHYROXINE SODIUM 50 MCG PO TABS
50.0000 ug | ORAL_TABLET | Freq: Every day | ORAL | Status: DC
  Administered 2019-09-03: 50 ug via ORAL
  Filled 2019-09-02: qty 1

## 2019-09-02 MED ORDER — ONDANSETRON HCL 4 MG/2ML IJ SOLN
INTRAMUSCULAR | Status: AC
  Filled 2019-09-02: qty 2

## 2019-09-02 MED ORDER — HYDROMORPHONE HCL 1 MG/ML IJ SOLN
INTRAMUSCULAR | Status: AC
  Administered 2019-09-02: 0.5 mg via INTRAVENOUS
  Filled 2019-09-02: qty 1

## 2019-09-02 MED ORDER — SILDENAFIL CITRATE 20 MG PO TABS: 20.0000 mg | ORAL_TABLET | Freq: Every day | ORAL | Status: DC | PRN

## 2019-09-02 MED ORDER — CELECOXIB 200 MG PO CAPS
200.0000 mg | ORAL_CAPSULE | Freq: Two times a day (BID) | ORAL | Status: DC
  Administered 2019-09-02 – 2019-09-03 (×2): 200 mg via ORAL
  Filled 2019-09-02 (×2): qty 1

## 2019-09-02 MED ORDER — PHENYLEPH-SHARK LIV OIL-MO-PET 0.25-3-14-71.9 % RE OINT
1.0000 "application " | TOPICAL_OINTMENT | Freq: Every day | RECTAL | Status: DC | PRN
  Filled 2019-09-02: qty 28.4

## 2019-09-02 MED ORDER — AMPHETAMINE-DEXTROAMPHET ER 5 MG PO CP24
20.0000 mg | ORAL_CAPSULE | Freq: Every day | ORAL | Status: DC
  Administered 2019-09-03: 20 mg via ORAL
  Filled 2019-09-02: qty 4

## 2019-09-02 MED ORDER — BACITRACIN ZINC 500 UNIT/GM EX OINT
TOPICAL_OINTMENT | Freq: Two times a day (BID) | CUTANEOUS | Status: DC
  Filled 2019-09-02: qty 28.4

## 2019-09-02 MED ORDER — ALPRAZOLAM 0.5 MG PO TABS
0.5000 mg | ORAL_TABLET | Freq: Every day | ORAL | Status: DC
  Administered 2019-09-02: 0.5 mg via ORAL
  Filled 2019-09-02: qty 1

## 2019-09-02 MED ORDER — FENTANYL CITRATE (PF) 250 MCG/5ML IJ SOLN
INTRAMUSCULAR | Status: AC
  Filled 2019-09-02: qty 5

## 2019-09-02 MED ORDER — LACTATED RINGERS IV SOLN
INTRAVENOUS | Status: DC
  Administered 2019-09-02: 18:00:00 via INTRAVENOUS

## 2019-09-02 MED ORDER — ONDANSETRON HCL 4 MG/2ML IJ SOLN
INTRAMUSCULAR | Status: DC | PRN
  Administered 2019-09-02: 4 mg via INTRAVENOUS

## 2019-09-02 MED ORDER — FENTANYL CITRATE (PF) 100 MCG/2ML IJ SOLN
INTRAMUSCULAR | Status: DC | PRN
  Administered 2019-09-02 (×2): 50 ug via INTRAVENOUS

## 2019-09-02 MED ORDER — ALBUTEROL SULFATE (2.5 MG/3ML) 0.083% IN NEBU: 3.0000 mL | INHALATION_SOLUTION | Freq: Four times a day (QID) | RESPIRATORY_TRACT | Status: DC | PRN

## 2019-09-02 MED ORDER — SODIUM CHLORIDE 0.9 % IV SOLN
3.0000 g | INTRAVENOUS | Status: AC
  Administered 2019-09-02: 18:00:00 3 g via INTRAVENOUS
  Filled 2019-09-02: qty 3

## 2019-09-02 MED ORDER — ONDANSETRON HCL 4 MG PO TABS: 4.0000 mg | ORAL_TABLET | Freq: Four times a day (QID) | ORAL | Status: DC | PRN

## 2019-09-02 MED ORDER — SODIUM CHLORIDE 0.9 % IV SOLN
3.0000 g | Freq: Four times a day (QID) | INTRAVENOUS | Status: DC
  Administered 2019-09-02 – 2019-09-03 (×4): 3 g via INTRAVENOUS
  Filled 2019-09-02: qty 8
  Filled 2019-09-02 (×2): qty 3
  Filled 2019-09-02 (×3): qty 8

## 2019-09-02 MED ORDER — HYDROMORPHONE HCL 1 MG/ML IJ SOLN
0.2500 mg | INTRAMUSCULAR | Status: DC | PRN
  Administered 2019-09-02: 0.5 mg via INTRAVENOUS

## 2019-09-02 MED ORDER — DEXAMETHASONE SODIUM PHOSPHATE 10 MG/ML IJ SOLN
INTRAMUSCULAR | Status: DC | PRN
  Administered 2019-09-02: 4 mg via INTRAVENOUS

## 2019-09-02 MED ORDER — DEXAMETHASONE SODIUM PHOSPHATE 10 MG/ML IJ SOLN
INTRAMUSCULAR | Status: AC
  Filled 2019-09-02: qty 1

## 2019-09-02 MED ORDER — ACETAMINOPHEN 500 MG PO TABS
500.0000 mg | ORAL_TABLET | Freq: Four times a day (QID) | ORAL | Status: AC
  Administered 2019-09-02 – 2019-09-03 (×4): 500 mg via ORAL
  Filled 2019-09-02 (×4): qty 1

## 2019-09-02 MED ORDER — LIDOCAINE HCL (PF) 1 % IJ SOLN
INTRAMUSCULAR | Status: AC
  Filled 2019-09-02: qty 30

## 2019-09-02 MED ORDER — MORPHINE SULFATE (PF) 2 MG/ML IV SOLN
0.5000 mg | INTRAVENOUS | Status: DC | PRN
  Administered 2019-09-03: 1 mg via INTRAVENOUS
  Filled 2019-09-02: qty 1

## 2019-09-02 MED ORDER — MIDAZOLAM HCL 5 MG/5ML IJ SOLN
INTRAMUSCULAR | Status: DC | PRN
  Administered 2019-09-02: 2 mg via INTRAVENOUS

## 2019-09-02 SURGICAL SUPPLY — 42 items
APL PRP STRL LF DISP 70% ISPRP (MISCELLANEOUS) ×1
BLADE SURG 15 STRL LF DISP TIS (BLADE) ×1 IMPLANT
BLADE SURG 15 STRL SS (BLADE) ×3
BNDG COHESIVE 2X5 TAN STRL LF (GAUZE/BANDAGES/DRESSINGS) IMPLANT
BNDG COHESIVE 4X5 TAN STRL (GAUZE/BANDAGES/DRESSINGS) ×3 IMPLANT
BNDG ELASTIC 4X5.8 VLCR STR LF (GAUZE/BANDAGES/DRESSINGS) ×2 IMPLANT
BNDG GAUZE ELAST 4 BULKY (GAUZE/BANDAGES/DRESSINGS) ×4 IMPLANT
CHLORAPREP W/TINT 26 (MISCELLANEOUS) ×3 IMPLANT
CORD BIPOLAR FORCEPS 12FT (ELECTRODE) ×3 IMPLANT
COVER WAND RF STERILE (DRAPES) ×3 IMPLANT
CUFF TOURN SGL QUICK 18X4 (TOURNIQUET CUFF) ×2 IMPLANT
DRAPE SURG 17X23 STRL (DRAPES) ×3 IMPLANT
DRSG EMULSION OIL 3X3 NADH (GAUZE/BANDAGES/DRESSINGS) ×3 IMPLANT
GAUZE SPONGE 4X4 12PLY STRL (GAUZE/BANDAGES/DRESSINGS) ×3 IMPLANT
GLOVE BIO SURGEON STRL SZ7.5 (GLOVE) ×5 IMPLANT
GLOVE BIOGEL PI IND STRL 7.0 (GLOVE) ×1 IMPLANT
GLOVE BIOGEL PI IND STRL 8 (GLOVE) ×1 IMPLANT
GLOVE BIOGEL PI INDICATOR 7.0 (GLOVE) ×2
GLOVE BIOGEL PI INDICATOR 8 (GLOVE) ×2
GLOVE ECLIPSE 6.5 STRL STRAW (GLOVE) ×3 IMPLANT
GOWN STRL REUS W/ TWL LRG LVL3 (GOWN DISPOSABLE) ×2 IMPLANT
GOWN STRL REUS W/ TWL XL LVL3 (GOWN DISPOSABLE) ×1 IMPLANT
GOWN STRL REUS W/TWL LRG LVL3 (GOWN DISPOSABLE) ×6
GOWN STRL REUS W/TWL XL LVL3 (GOWN DISPOSABLE) ×3
KIT BASIN OR (CUSTOM PROCEDURE TRAY) ×3 IMPLANT
NS IRRIG 1000ML POUR BTL (IV SOLUTION) ×3 IMPLANT
PACK ORTHO EXTREMITY (CUSTOM PROCEDURE TRAY) ×3 IMPLANT
PAD CAST 4YDX4 CTTN HI CHSV (CAST SUPPLIES) IMPLANT
PADDING CAST ABS 4INX4YD NS (CAST SUPPLIES)
PADDING CAST ABS COTTON 4X4 ST (CAST SUPPLIES) IMPLANT
PADDING CAST COTTON 4X4 STRL (CAST SUPPLIES) ×3
RUBBERBAND STERILE (MISCELLANEOUS) IMPLANT
SET IRRIG Y TYPE TUR BLADDER L (SET/KITS/TRAYS/PACK) IMPLANT
SPONGE GAUZE 2X2 8PLY STER LF (GAUZE/BANDAGES/DRESSINGS) ×1
SPONGE GAUZE 2X2 8PLY STRL LF (GAUZE/BANDAGES/DRESSINGS) ×1 IMPLANT
STOCKINETTE 6  STRL (DRAPES) ×2
STOCKINETTE 6 STRL (DRAPES) ×1 IMPLANT
SUT VICRYL RAPIDE 4/0 PS 2 (SUTURE) ×2 IMPLANT
SYR 10ML LL (SYRINGE) IMPLANT
TUBE CONNECTING 12'X1/4 (SUCTIONS) ×1
TUBE CONNECTING 12X1/4 (SUCTIONS) ×1 IMPLANT
UNDERPAD 30X30 (UNDERPADS AND DIAPERS) ×3 IMPLANT

## 2019-09-02 NOTE — ED Notes (Signed)
Dr. Grandville Silos here to assess pt.

## 2019-09-02 NOTE — Discharge Instructions (Addendum)
Take your oral antibiotic twice daily. Rely mostly on tyenol and ibuprofen for pain, using the oxycodone very sparingly for extreme pain not relieved by the other 2 meds. Continue wound care as started in the hospital twice daily:  Cleansing with soap and running water Apply small dab of bacitracin ointment to inside of open wound only Light dressing to hold it in place   WORK ON MOVING Erik Strong

## 2019-09-02 NOTE — Progress Notes (Signed)
Pharmacy Antibiotic Note  Erik Strong is a 58 y.o. 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 Pt is S/P L index MCP arthrotomy for I & D this evening Pharmacy has been consulted for Unasyn dosing. Pt rec'd Unasyn 3 gm IV pre-op at 18:03 PM today.  WBC 13.4, afebrile; Scr 1.45 (~baseline), CrCl 71.5 ml/min   Plan: Unasyn 3 gm IV Q 6 hrs Monitor renal function, WBC, temp, clinical improvement, cultures  Height: 6\' 1"  (185.4 cm) Weight: 231 lb (104.8 kg) IBW/kg (Calculated) : 79.9  Temp (24hrs), Avg:97.9 F (36.6 C), Min:97 F (36.1 C), Max:98.5 F (36.9 C)  Recent Labs  Lab 09/02/19 1617 09/02/19 1705  WBC  --  13.4*  CREATININE 1.45*  --     Estimated Creatinine Clearance: 71.5 mL/min (A) (by C-G formula based on SCr of 1.45 mg/dL (H)).    Allergies  Allergen Reactions  . Codeine Nausea And Vomiting  . Macrodantin [Nitrofurantoin Macrocrystal] Rash  . Shellfish Allergy Nausea And Vomiting    Microbiology results: 12/14 COVID: negative 12/14 Influenza A, B: negative 12/14 Surgical/wound cx: pending  Thank you for allowing pharmacy to be a part of this patient's care.  Gillermina Hu, PharmD, BCPS, [facility] Clinical Pharmacist 09/02/2019 8:13 PM
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
## 2019-09-02 NOTE — Op Note (Signed)
09/02/2019  6:24 PM  PATIENT:  Erik Strong  58 y.o. 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 Grandville Silos, MD  PHYSICIAN ASSISTANT: None  ANESTHESIA:  general  SPECIMENS: Swabs to microbiology  DRAINS:   None  EBL:  less than 50 mL  PREOPERATIVE INDICATIONS:  Erik Strong is a  58 y.o. male with a 7-day-old cat bite to the dorsal aspect of the index finger, at the level of the MCP joint, with developing redness, swelling, and pain with range of motion of the index MCP, including torsional stress  The risks benefits and alternatives were discussed with the patient preoperatively including but not limited to the risks of infection, bleeding, nerve injury, cardiopulmonary complications, the need for revision surgery, among others, and the patient verbalized understanding and consented to proceed.  OPERATIVE IMPLANTS: None  OPERATIVE PROCEDURE:  After withholding prophylactic antibiotics, the patient was escorted to the operative theatre and placed in a supine position.  General anesthesia was administered A surgical "time-out" was performed during which the planned procedure, proposed operative site, and the correct patient identity were compared to the operative consent and agreement confirmed by the circulating nurse according to current facility policy.  Following application of a tourniquet to the operative extremity, the exposed skin was prepped with Chloraprep and draped in the usual sterile fashion.  The limb was exsanguinated with an Esmarch bandage, beginning at the level of the wrist and progressing proximally, and the tourniquet inflated to approximately 167mmHg higher than systolic BP.  The laceration on the dorsal and ulnar aspect of the MCP joint was extended proximally obliquely.  As the skin was incised, some  seropurulent fluid was encountered and cultures were obtained.  The wound was opened sufficiently to explore both puncture wounds.  The radial seem superficial, extending just into the subcutaneous tissues while the ulnar 1 appeared of perforated the extensor apparatus, ulnar to the central thickening.  This traumatic opening was exploited extending proximally and distally, essentially resulting in partial division of the ulnar sagittal band.  The MCP joint appeared to have an effusion, and the capsule was incised in a T-shaped, with 1 limb across the dorsum of the MCP joint and the other longitudinally, just superficial to the collateral ligaments.  Again some more turbid fluid was encountered.  This was copiously irrigated.  Next, subcutaneous plane was irrigated and 4-0 Vicryl Rapide used to loosely reapproximate some of the surgically extended portions of the wound.  Small packing was left holding the wound edges open at the site of the more significant cat bite wound, and a bulky dressing was applied as the tourniquet was deflated.  He was awakened and taken to recovery in stable condition, breathing spontaneously  DISPOSITION: He will be admitted for IV antibiotics, and transition to orals as he makes clinical recovery.

## 2019-09-02 NOTE — Transfer of Care (Signed)
Immediate Anesthesia Transfer of Care Note  Patient: Erik Strong  Procedure(s) Performed: IRRIGATION AND DEBRIDEMENT HAND (Left )  Patient Location: PACU  Anesthesia Type:General  Level of Consciousness: drowsy and patient cooperative  Airway & Oxygen Therapy: Patient Spontanous Breathing and Patient connected to nasal cannula oxygen  Post-op Assessment: Report given to RN, Post -op Vital signs reviewed and stable and Patient moving all extremities  Post vital signs: Reviewed and stable  Last Vitals:  Vitals Value Taken Time  BP    Temp    Pulse    Resp 20 09/02/19 1822  SpO2    Vitals shown include unvalidated device data.  Last Pain:  Vitals:   09/02/19 1401  TempSrc:   PainSc: 9          Complications: No apparent anesthesia complications
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
## 2019-09-02 NOTE — ED Triage Notes (Signed)
Pt was bit on his left hand by stray cat yesterday, swelling and redness noted to hand. Unknown vaccination status.

## 2019-09-02 NOTE — ED Notes (Signed)
Pt to OR at this time.

## 2019-09-02 NOTE — ED Notes (Signed)
OR consent signed by pt after procedure explained by Dr. Grandville Silos

## 2019-09-02 NOTE — Anesthesia Preprocedure Evaluation (Addendum)
Anesthesia Evaluation  Patient identified by MRN, date of birth, ID band Patient awake    Reviewed: Allergy & Precautions, H&P , NPO status , Patient's Chart, lab work & pertinent test results  Airway Mallampati: II  TM Distance: >3 FB Neck ROM: Full    Dental no notable dental hx. (+) Partial Lower, Partial Upper, Poor Dentition, Dental Advisory Given   Pulmonary COPD, former smoker,    Pulmonary exam normal breath sounds clear to auscultation       Cardiovascular negative cardio ROS   Rhythm:Regular Rate:Normal     Neuro/Psych Anxiety negative neurological ROS     GI/Hepatic negative GI ROS, Neg liver ROS,   Endo/Other  negative endocrine ROS  Renal/GU negative Renal ROS  negative genitourinary   Musculoskeletal  (+) Arthritis , Osteoarthritis,    Abdominal   Peds  Hematology negative hematology ROS (+)   Anesthesia Other Findings   Reproductive/Obstetrics negative OB ROS                            Anesthesia Physical Anesthesia Plan  ASA: II  Anesthesia Plan: General   Post-op Pain Management:    Induction: Intravenous  PONV Risk Score and Plan: 3 and Ondansetron, Dexamethasone and Midazolam  Airway Management Planned: LMA  Additional Equipment:   Intra-op Plan:   Post-operative Plan: Extubation in OR  Informed Consent: I have reviewed the patients History and Physical, chart, labs and discussed the procedure including the risks, benefits and alternatives for the proposed anesthesia with the patient or authorized representative who has indicated his/her understanding and acceptance.     Dental advisory given  Plan Discussed with: CRNA  Anesthesia Plan Comments:        Anesthesia Quick Evaluation

## 2019-09-02 NOTE — ED Triage Notes (Signed)
Pt sent by Sadie Haber to see Dr Doran Durand

## 2019-09-02 NOTE — Anesthesia Procedure Notes (Signed)
Procedure Name: LMA Insertion Date/Time: 09/02/2019 5:56 PM Performed by: Moshe Salisbury, CRNA Pre-anesthesia Checklist: Patient identified, Emergency Drugs available, Suction available and Patient being monitored Patient Re-evaluated:Patient Re-evaluated prior to induction Oxygen Delivery Method: Circle System Utilized Preoxygenation: Pre-oxygenation with 100% oxygen Induction Type: IV induction Ventilation: Mask ventilation without difficulty LMA: LMA inserted LMA Size: 5.0 Number of attempts: 1 Placement Confirmation: positive ETCO2 Tube secured with: Tape Dental Injury: Teeth and Oropharynx as per pre-operative assessment

## 2019-09-03 LAB — CBC
HCT: 46 % (ref 39.0–52.0)
Hemoglobin: 15.4 g/dL (ref 13.0–17.0)
MCH: 31.1 pg (ref 26.0–34.0)
MCHC: 33.5 g/dL (ref 30.0–36.0)
MCV: 92.9 fL (ref 80.0–100.0)
Platelets: 322 10*3/uL (ref 150–400)
RBC: 4.95 MIL/uL (ref 4.22–5.81)
RDW: 13.2 % (ref 11.5–15.5)
WBC: 13.9 10*3/uL — ABNORMAL HIGH (ref 4.0–10.5)
nRBC: 0 % (ref 0.0–0.2)

## 2019-09-03 MED ORDER — IBUPROFEN 200 MG PO TABS: 600.0000 mg | ORAL_TABLET | Freq: Four times a day (QID) | ORAL | Status: DC

## 2019-09-03 MED ORDER — OXYCODONE HCL 5 MG PO TABS: 5.0000 mg | ORAL_TABLET | Freq: Four times a day (QID) | ORAL | 0 refills | Status: DC | PRN

## 2019-09-03 MED ORDER — ACETAMINOPHEN 325 MG PO TABS: 650.0000 mg | ORAL_TABLET | Freq: Four times a day (QID) | ORAL | Status: AC

## 2019-09-03 MED ORDER — AMOXICILLIN-POT CLAVULANATE 875-125 MG PO TABS: 1.0000 | ORAL_TABLET | Freq: Two times a day (BID) | ORAL | 0 refills | Status: AC

## 2019-09-03 NOTE — Anesthesia Postprocedure Evaluation (Signed)
Anesthesia Post Note  Patient: Erik Strong  Procedure(s) Performed: IRRIGATION AND DEBRIDEMENT HAND (Left )     Patient location during evaluation: Other Anesthesia Type: General Level of consciousness: awake and alert Pain management: pain level controlled Vital Signs Assessment: post-procedure vital signs reviewed and stable Respiratory status: spontaneous breathing, nonlabored ventilation and respiratory function stable Cardiovascular status: blood pressure returned to baseline and stable Postop Assessment: no apparent nausea or vomiting Anesthetic complications: no    Last Vitals:  Vitals:   09/03/19 0748 09/03/19 0908  BP: (!) 151/75   Pulse: 67   Resp: 19   Temp:    SpO2: 96% 96%    Last Pain:  Vitals:   09/03/19 0800  TempSrc:   PainSc: 4                  Jewell Haught,W. EDMOND

## 2019-09-03 NOTE — Progress Notes (Signed)
Patient indicates pain is better, motion easier, and he is comfortable with dressing changes.  We will discharge him now, transitioning him to oral medicines, with office follow-up on Tuesday.

## 2019-09-03 NOTE — Plan of Care (Signed)
  Problem: Pain Managment: Goal: General experience of comfort will improve 09/03/2019 0549 by Jackson Latino, RN Outcome: Progressing 09/03/2019 0549 by Jackson Latino, RN Outcome: Progressing   Problem: Skin Integrity: Goal: Risk for impaired skin integrity will decrease 09/03/2019 0549 by Jackson Latino, RN Outcome: Progressing 09/03/2019 0549 by Jackson Latino, RN Outcome: Progressing   Problem: Safety: Goal: Ability to remain free from injury will improve Outcome: Progressing

## 2019-09-03 NOTE — Progress Notes (Signed)
Patient discharging home. Discharge instructions explained to patient including dressing changes and he verbalized understanding. Dressing changed prior to discharge and patient states that his girlfriend will assist with treatment. No further questions or concerns voiced.

## 2019-09-03 NOTE — Plan of Care (Signed)

## 2019-09-03 NOTE — Care Management Obs Status (Signed)
[facility] NOTIFICATION   Patient Details  Name: Erik Strong MRN: [id number] Date of Birth: November 25, 1960   Medicare Observation Status Notification Given:  Yes    Rae Mar, RN 09/03/2019, 1:05 PM
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
## 2019-09-07 LAB — AEROBIC/ANAEROBIC CULTURE W GRAM STAIN (SURGICAL/DEEP WOUND)

## 2019-10-02 ENCOUNTER — Ambulatory Visit: Payer: Medicare Other | Admitting: Internal Medicine

## 2019-10-17 ENCOUNTER — Other Ambulatory Visit (HOSPITAL_COMMUNITY)
Admission: RE | Admit: 2019-10-17 | Discharge: 2019-10-17 | Disposition: A | Discharge status: Home / Self Care | Payer: Medicare Other | Source: Ambulatory Visit | Attending: Internal Medicine | Admitting: Internal Medicine

## 2019-10-17 DIAGNOSIS — Z20822 Contact with and (suspected) exposure to covid-19: Secondary | ICD-10-CM | POA: Diagnosis not present

## 2019-10-17 DIAGNOSIS — Z01812 Encounter for preprocedural laboratory examination: Secondary | ICD-10-CM | POA: Diagnosis present

## 2019-10-17 LAB — SARS CORONAVIRUS 2 (TAT 6-24 HRS): SARS Coronavirus 2: NEGATIVE

## 2019-10-21 ENCOUNTER — Other Ambulatory Visit: Payer: Self-pay

## 2019-10-21 ENCOUNTER — Ambulatory Visit (INDEPENDENT_AMBULATORY_CARE_PROVIDER_SITE_OTHER): Payer: Medicare Other | Admitting: Internal Medicine

## 2019-10-21 ENCOUNTER — Encounter: Payer: Self-pay | Admitting: Internal Medicine

## 2019-10-21 ENCOUNTER — Ambulatory Visit: Payer: Medicare Other | Admitting: Internal Medicine

## 2019-10-21 DIAGNOSIS — R0600 Dyspnea, unspecified: Secondary | ICD-10-CM | POA: Diagnosis not present

## 2019-10-21 DIAGNOSIS — J84112 Idiopathic pulmonary fibrosis: Secondary | ICD-10-CM

## 2019-10-21 DIAGNOSIS — J449 Chronic obstructive pulmonary disease, unspecified: Secondary | ICD-10-CM | POA: Diagnosis not present

## 2019-10-21 LAB — PULMONARY FUNCTION TEST
DL/VA % pred: 91 %
DL/VA: 3.89 ml/min/mmHg/L
DLCO cor % pred: 86 %
DLCO cor: 27.11 ml/min/mmHg
DLCO unc % pred: 88 %
DLCO unc: 27.7 ml/min/mmHg
FEF 25-75 Post: 4.01 L/sec
FEF 25-75 Pre: 3.94 L/sec
FEF2575-%Change-Post: 1 %
FEF2575-%Pred-Post: 115 %
FEF2575-%Pred-Pre: 113 %
FEV1-%Change-Post: 0 %
FEV1-%Pred-Post: 90 %
FEV1-%Pred-Pre: 90 %
FEV1-Post: 3.75 L
FEV1-Pre: 3.76 L
FEV1FVC-%Change-Post: -3 %
FEV1FVC-%Pred-Pre: 107 %
FEV6-%Change-Post: 2 %
FEV6-%Pred-Post: 89 %
FEV6-%Pred-Pre: 87 %
FEV6-Post: 4.72 L
FEV6-Pre: 4.59 L
FEV6FVC-%Change-Post: 0 %
FEV6FVC-%Pred-Post: 104 %
FEV6FVC-%Pred-Pre: 104 %
FVC-%Change-Post: 2 %
FVC-%Pred-Post: 86 %
FVC-%Pred-Pre: 84 %
FVC-Post: 4.72 L
FVC-Pre: 4.59 L
Post FEV1/FVC ratio: 79 %
Post FEV6/FVC ratio: 100 %
Pre FEV1/FVC ratio: 82 %
Pre FEV6/FVC Ratio: 100 %
RV % pred: 140 %
RV: 3.32 L
TLC % pred: 103 %
TLC: 7.98 L

## 2019-10-21 NOTE — Progress Notes (Signed)
Full PFT performed today. °
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
## 2019-10-21 NOTE — Patient Instructions (Addendum)
Try taking symbicort on alternate days at least 15 minute before heavy exertion   Please schedule a follow up visit in 6  months but call sooner if needed  - add needs hrct on return

## 2019-10-22 ENCOUNTER — Telehealth: Payer: Self-pay | Admitting: *Deleted

## 2019-10-22 ENCOUNTER — Encounter: Payer: Self-pay | Admitting: Internal Medicine

## 2019-10-22 DIAGNOSIS — J84112 Idiopathic pulmonary fibrosis: Secondary | ICD-10-CM

## 2019-10-22 NOTE — Assessment & Plan Note (Signed)
Quit smoking 2018 then 25lb wt gain as of 1st pulm ov 05/09/2019  - 05/09/2019   Walked RA  2 laps @  approx 270ft each @ fast pace  stopped due to  End of study, no sob, sats 96%  - 05/09/2019  After extensive coaching inhaler device,  effectiveness =    90% try symb 160 2bid ? ? Benefit but only using a few times a week - PFT's  10/21/2019  FEV1 3.75 (90 % ) ratio 0.79  p 0 % improvement from saba p 0 prior to study with DLCO  27.70 (88%) corrects to 3.89 (91%)  for alv volume and FV curve nl   > rec symb 160 15 min before heavy activity on alternating days and d/c if no benefit   No evidence of significant copd here nor likely to ever be the case. He could have an element of asthma but if so it's quite mild. Based on two studies from NEJM  378; 20 p 1865 (2018) and 380 : p2020-30 (2019) in pts with mild asthma it is reasonable to use low dose symbicort eg 80 2bid "prn" flare in this setting but I emphasized this was only shown with symbicort and takes advantage of the rapid onset of action but is not the same as "rescue therapy" but can be stopped once the acute symptoms have resolved and the need for rescue has been minimized (< 2 x weekly)    Since he already has the symb 160 can do the same with it and d/c if not better.   Pt informed of the seriousness of COVID 19 infection as a direct risk to lung health  and safey and to close contacts and should continue to wear a facemask in public and minimize exposure to public locations but especially avoid any area or activity where non-close contacts are not observing distancing or wearing an appropriate face mask.  I strongly recommended vaccine when offered.     F/u in 6 months, sooner prn           Each maintenance medication was reviewed in detail including emphasizing most importantly the difference between maintenance and prns and under what circumstances the prns are to be triggered using an action plan format where appropriate.  Total time for  H and P, chart/pfts review, counseling,   and generating customized AVS unique to this office visit / charting = 30 min

## 2019-10-22 NOTE — Telephone Encounter (Signed)
-----   Message from Tanda Rockers, MD sent at 10/22/2019  6:01 AM EST ----- Needs hrct chest prior to next ov, forgot to tell him that to f/u his asbestos exposure issues

## 2019-10-22 NOTE — Assessment & Plan Note (Signed)
Seen first on CT abd 7//9/20  -  PFTs 10/21/2019 wnl with nl dlco  Will need f/u with HRCT prior to ov / advised .

## 2019-12-16 ENCOUNTER — Ambulatory Visit: Payer: Medicare Other | Attending: Internal Medicine

## 2019-12-16 DIAGNOSIS — Z23 Encounter for immunization: Secondary | ICD-10-CM

## 2019-12-16 NOTE — Progress Notes (Signed)
   Covid-19 Vaccination Clinic  Name:  Erik Strong    MRN: [id number] DOB: [birth year]  12/16/2019  Erik Strong was observed post Covid-19 immunization for 15 minutes without incident. He was provided with Vaccine Information Sheet and instruction to access the V-Safe system.   Erik Strong was instructed to call 911 with any severe reactions post vaccine: Marland Kitchen Difficulty breathing  . Swelling of face and throat  . A fast heartbeat  . A bad rash all over body  . Dizziness and weakness   Immunizations Administered    Name Date Dose VIS Date Route   Pfizer COVID-19 Vaccine 12/16/2019  3:40 PM 0.3 mL 08/30/2019 Intramuscular   Manufacturer: [place]   Lot: [id number]   [place]: [id number]

## 2020-01-08 ENCOUNTER — Ambulatory Visit: Payer: Medicare Other | Attending: Internal Medicine

## 2020-01-08 DIAGNOSIS — Z23 Encounter for immunization: Secondary | ICD-10-CM

## 2020-01-08 NOTE — Progress Notes (Signed)
   Covid-19 Vaccination Clinic  Name:  DEYLAN ECHELBARGER    MRN: [id number] DOB: 31-Mar-1961  01/08/2020  Mr. Cannedy was observed post Covid-19 immunization for 15 minutes without incident. He was provided with Vaccine Information Sheet and instruction to access the V-Safe system.   Mr. Simonich was instructed to call 911 with any severe reactions post vaccine: Marland Kitchen Difficulty breathing  . Swelling of face and throat  . A fast heartbeat  . A bad rash all over body  . Dizziness and weakness   Immunizations Administered    Name Date Dose VIS Date Route   Pfizer COVID-19 Vaccine 01/08/2020 10:25 AM 0.3 mL 11/13/2018 Intramuscular   Manufacturer: [place]   Lot: [id number]   [place]: [id number]

## 2020-01-21 ENCOUNTER — Inpatient Hospital Stay: Admission: RE | Admit: 2020-01-21 | Payer: Medicare Other | Source: Ambulatory Visit

## 2020-04-20 ENCOUNTER — Ambulatory Visit: Payer: Medicare Other | Admitting: Internal Medicine

## 2020-04-20 ENCOUNTER — Other Ambulatory Visit: Payer: Self-pay

## 2020-04-20 ENCOUNTER — Encounter: Payer: Self-pay | Admitting: Internal Medicine

## 2020-04-20 DIAGNOSIS — J449 Chronic obstructive pulmonary disease, unspecified: Secondary | ICD-10-CM | POA: Diagnosis not present

## 2020-04-20 DIAGNOSIS — J84112 Idiopathic pulmonary fibrosis: Secondary | ICD-10-CM | POA: Diagnosis not present

## 2020-04-20 NOTE — Patient Instructions (Signed)
We will call after the first of the year to arrange CT chest - I will call you the result  Symbicort is up 1- 2 pffs every 12 hours     Please schedule a follow up visit in 12 months but call sooner if needed   .
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
## 2020-04-21 ENCOUNTER — Encounter: Payer: Self-pay | Admitting: Internal Medicine

## 2020-04-21 NOTE — Assessment & Plan Note (Signed)
Seen first on CT abd 7//9/20  -  PFTs 10/21/2019 wnl with nl dlco - 04/20/2020 declines f/u CT due to insurance issues   Due for yearly f/u HRCT but says he is doing battle with insurance over paying for studies he's already done so declines for now but after the 1st of the year agrees to return for this study.   Discussed in detail all the  indications, usual  risks and alternatives  relative to the benefits with patient who agrees to proceed with w/u as outlined.            Each maintenance medication was reviewed in detail including emphasizing most importantly the difference between maintenance and prns and under what circumstances the prns are to be triggered using an action plan format where appropriate.  Total time for H and P, chart review, counseling, teaching device and generating customized AVS unique to this office visit / charting =  20 min

## 2020-04-21 NOTE — Assessment & Plan Note (Signed)
Quit smoking 2018 then 25lb wt gain as of 1st pulm ov 05/09/2019  - 05/09/2019   Walked RA  2 laps @  approx 246ft each @ fast pace  stopped due to  End of study, no sob, sats 96%  - 05/09/2019  After extensive coaching inhaler device,  effectiveness =    90% try symb 160 2bid ? ? Benefit but only using a few times a week - PFT's  10/21/2019  FEV1 3.75 (90 % ) ratio 0.79  p 0 % improvement from saba p 0 prior to study with DLCO  27.70 (88%) corrects to 3.89 (91%)  for alv volume and FV curve nl   > rec symb 160 15 min before heavy activity on alternating days and d/c if no benefit   Does feel symbicort helps when uses it so rec take 1-2 puffs bid for th AB component Based on two studies from NEJM  378; 20 p 1865 (2018) and 380 : p2020-30 (2019) in pts with mild asthma it is reasonable to use low dose symbicort eg 80 2bid "prn" flare in this setting but I emphasized this was only shown with symbicort and takes advantage of the rapid onset of action but is not the same as "rescue therapy" but can be stopped once the acute symptoms have resolved and the need for rescue has been minimized (< 2 x weekly)

## 2020-09-07 ENCOUNTER — Emergency Department (HOSPITAL_COMMUNITY)
Admission: EM | Admit: 2020-09-07 | Discharge: 2020-09-08 | Disposition: A | Discharge status: Home / Self Care | Payer: Medicare Other | Attending: Emergency Medicine | Admitting: Emergency Medicine

## 2020-09-07 ENCOUNTER — Other Ambulatory Visit: Payer: Self-pay

## 2020-09-07 ENCOUNTER — Emergency Department (HOSPITAL_COMMUNITY): Payer: Medicare Other

## 2020-09-07 ENCOUNTER — Encounter (HOSPITAL_COMMUNITY): Payer: Self-pay | Admitting: Emergency Medicine

## 2020-09-07 DIAGNOSIS — Z5321 Procedure and treatment not carried out due to patient leaving prior to being seen by health care provider: Secondary | ICD-10-CM | POA: Insufficient documentation

## 2020-09-07 DIAGNOSIS — R0602 Shortness of breath: Secondary | ICD-10-CM | POA: Insufficient documentation

## 2020-09-07 DIAGNOSIS — R079 Chest pain, unspecified: Secondary | ICD-10-CM | POA: Diagnosis not present

## 2020-09-07 LAB — CBC
HCT: 50.2 % (ref 39.0–52.0)
Hemoglobin: 16.6 g/dL (ref 13.0–17.0)
MCH: 31.3 pg (ref 26.0–34.0)
MCHC: 33.1 g/dL (ref 30.0–36.0)
MCV: 94.5 fL (ref 80.0–100.0)
Platelets: 377 10*3/uL (ref 150–400)
RBC: 5.31 MIL/uL (ref 4.22–5.81)
RDW: 13.2 % (ref 11.5–15.5)
WBC: 12.4 10*3/uL — ABNORMAL HIGH (ref 4.0–10.5)
nRBC: 0 % (ref 0.0–0.2)

## 2020-09-07 LAB — PROTIME-INR
INR: 0.9 (ref 0.8–1.2)
Prothrombin Time: 11.9 seconds (ref 11.4–15.2)

## 2020-09-07 NOTE — ED Triage Notes (Signed)
Patient reports left chest pain with mild SOB this evening , denies emesis or diaphoresis , no cough or fever .

## 2020-09-08 LAB — TROPONIN I (HIGH SENSITIVITY)
Troponin I (High Sensitivity): 6 ng/L (ref <18)
Troponin I (High Sensitivity): 7 ng/L (ref <18)

## 2020-09-08 LAB — BASIC METABOLIC PANEL
Anion gap: 10 (ref 5–15)
BUN: 14 mg/dL (ref 6–20)
CO2: 29 mmol/L (ref 22–32)
Calcium: 9.3 mg/dL (ref 8.9–10.3)
Chloride: 100 mmol/L (ref 98–111)
Creatinine, Ser: 1.63 mg/dL — ABNORMAL HIGH (ref 0.61–1.24)
GFR, Estimated: 49 mL/min — ABNORMAL LOW (ref >60)
Glucose, Bld: 104 mg/dL — ABNORMAL HIGH (ref 70–99)
Potassium: 4.1 mmol/L (ref 3.5–5.1)
Sodium: 139 mmol/L (ref 135–145)

## 2020-09-08 NOTE — ED Notes (Signed)
Pt decided to leave, said he would follow up with urgent care/his primary care physician in the morning. Pt left

## 2020-09-25 ENCOUNTER — Ambulatory Visit (INDEPENDENT_AMBULATORY_CARE_PROVIDER_SITE_OTHER)
Admission: RE | Admit: 2020-09-25 | Discharge: 2020-09-25 | Disposition: A | Discharge status: Home / Self Care | Payer: Medicare Other | Source: Ambulatory Visit | Attending: Internal Medicine | Admitting: Internal Medicine

## 2020-09-25 ENCOUNTER — Other Ambulatory Visit: Payer: Self-pay

## 2020-09-25 DIAGNOSIS — I2510 Atherosclerotic heart disease of native coronary artery without angina pectoris: Secondary | ICD-10-CM | POA: Diagnosis not present

## 2020-09-25 DIAGNOSIS — J479 Bronchiectasis, uncomplicated: Secondary | ICD-10-CM | POA: Diagnosis not present

## 2020-09-25 DIAGNOSIS — Z77090 Contact with and (suspected) exposure to asbestos: Secondary | ICD-10-CM | POA: Diagnosis not present

## 2020-09-25 DIAGNOSIS — J432 Centrilobular emphysema: Secondary | ICD-10-CM | POA: Diagnosis not present

## 2020-09-25 DIAGNOSIS — J84112 Idiopathic pulmonary fibrosis: Secondary | ICD-10-CM

## 2020-09-29 NOTE — Progress Notes (Signed)
Spoke with the pt and scheduled appt with MR for 11/12/20

## 2020-11-12 ENCOUNTER — Encounter: Payer: Self-pay | Admitting: Internal Medicine

## 2020-11-12 ENCOUNTER — Ambulatory Visit (INDEPENDENT_AMBULATORY_CARE_PROVIDER_SITE_OTHER): Payer: Medicare Other | Admitting: Internal Medicine

## 2020-11-12 ENCOUNTER — Other Ambulatory Visit: Payer: Self-pay

## 2020-11-12 VITALS — BP 124/84 | HR 71 | Temp 97.5°F | Ht 72.5 in | Wt 236.0 lb

## 2020-11-12 DIAGNOSIS — I2510 Atherosclerotic heart disease of native coronary artery without angina pectoris: Secondary | ICD-10-CM

## 2020-11-12 DIAGNOSIS — J84112 Idiopathic pulmonary fibrosis: Secondary | ICD-10-CM

## 2020-11-12 LAB — HEPATIC FUNCTION PANEL
ALT: 32 U/L (ref 0–53)
AST: 29 U/L (ref 0–37)
Albumin: 4.1 g/dL (ref 3.5–5.2)
Alkaline Phosphatase: 57 U/L (ref 39–117)
Bilirubin, Direct: 0.1 mg/dL (ref 0.0–0.3)
Total Bilirubin: 0.2 mg/dL (ref 0.2–1.2)
Total Protein: 7.3 g/dL (ref 6.0–8.3)

## 2020-11-12 LAB — CBC WITH DIFFERENTIAL/PLATELET
Basophils Absolute: 0.1 10*3/uL (ref 0.0–0.1)
Basophils Relative: 1 % (ref 0.0–3.0)
Eosinophils Absolute: 0.1 10*3/uL (ref 0.0–0.7)
Eosinophils Relative: 1.1 % (ref 0.0–5.0)
HCT: 44.8 % (ref 39.0–52.0)
Hemoglobin: 15 g/dL (ref 13.0–17.0)
Lymphocytes Relative: 36.1 % (ref 12.0–46.0)
Lymphs Abs: 2.9 10*3/uL (ref 0.7–4.0)
MCHC: 33.4 g/dL (ref 30.0–36.0)
MCV: 92.4 fl (ref 78.0–100.0)
Monocytes Absolute: 0.6 10*3/uL (ref 0.1–1.0)
Monocytes Relative: 7.1 % (ref 3.0–12.0)
Neutro Abs: 4.4 10*3/uL (ref 1.4–7.7)
Neutrophils Relative %: 54.7 % (ref 43.0–77.0)
Platelets: 363 10*3/uL (ref 150.0–400.0)
RBC: 4.85 Mil/uL (ref 4.22–5.81)
RDW: 14.3 % (ref 11.5–15.5)
WBC: 8.1 10*3/uL (ref 4.0–10.5)

## 2020-11-12 LAB — SEDIMENTATION RATE: Sed Rate: 27 mm/hr — ABNORMAL HIGH (ref 0–20)

## 2020-11-12 MED ORDER — ALBUTEROL SULFATE HFA 108 (90 BASE) MCG/ACT IN AERS: 2.0000 | INHALATION_SPRAY | Freq: Four times a day (QID) | RESPIRATORY_TRACT | 3 refills | Status: DC | PRN

## 2020-11-12 NOTE — Patient Instructions (Addendum)
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 IPF (idiopathic pulmonary fibrosis) ([place])  J84.112   3. Coronary artery calcification seen on CAT scan  I25.10    #Pulmnary fibrosis You have pulmonary fibrosis.  There are many types to this most likely you have idiopathic pulmonary fibrosis [IPF]  The current stage of this is early/mild  There is some associated mild emphysema  Plan  - need more confirmatoy procedures to figure out if this is IPF   - Serum: ESR,, ANA, DS-DNA, RF, anti-CCP,  , Total CK,  Aldolase,   scl-70, ssA, ssB, anti-RNP, anti-JO-1,   & Hypersensitivity Pneumonitis Panel   - do blood quantiferon gold test   = do cbc, bmet and lft  - start pirfenidone  - meet with pharmacist for counseling before starting medication  -Noted that he initially wanted to do an nintedanib but as he left the office he changed to pirfenidone because he wanted to avoid diarrhea and he accepted applying sunscreen f  - other steps in mamgment - weight loss, rehab, trials, support group, transplant - to be discussed later  - at some point in time could consider myositis panel due to cracked skin in fingers   EMphysema   -- check alpha 1 AT phenotpype - continue symbicort  Coronary ARtery Calcificatipn   - refer cardiology esp given recent visit to ER   Followup  - 6 weeks with Dr Chase Caller or nurse practtioner - 30 min slot

## 2020-11-12 NOTE — Progress Notes (Signed)
OV 11/12/2020  Subjective:  Patient ID: Erik Strong, male , DOB: December 19, 1960 , age 60 y.o. , MRN: [id number] , ADDRESS: [place] [address] PCP Alroy Dust, L.[name], MD Patient Care Team: Alroy Dust, Carlean Jews.[name], MD as PCP - General (Family Medicine)  This Provider for this visit: Treatment Team:  Attending Provider: Brand Males, MD    11/12/2020 -   Chief Complaint  Patient presents with  . New Patient (Initial Visit)    Doing ok, winded at times     HPI Erik Strong 60 y.o. -referred by Dr. Christinia Gully to the ILD center because of discovery of pulmonary fibrosis he also has associated emphysema.  He says he has been disabled because of back issues.  He says he has severe ADD and sometimes he will have to search for a ski for 2 hours.  He says he can get overwhelmed easily and it might take multiple visits for him to fully grasp disease discussion and therapeutic management plan and prognosis.  [facility] Comprehensive ILD Questionnaire  Symptoms: Insidious onset of shortness of breath gradually getting worse for the last several years.  Episodic dyspnea present severity scale below.  He does have difficulty keeping up with others of his age.  He does have some arthralgia.  He does have cough he coughs at night he brings up some phlegm.  It is whitish-green.  He does clear his throat he does feel this a tickle in the back of the throat occasional wheezing.  No nausea no vomiting no diarrhea.   SYMPTOM SCALE - ILD 11/12/2020   O2 use ra  Shortness of Breath 0 -> 5 scale with 5 being worst (score 6 If unable to do)  At rest 1  Simple tasks - showers, clothes change, eating, shaving 2  Household (dishes, doing bed, laundry) 2  Shopping 3  Walking level at own pace 1  Walking up Stairs 3  Total (30-36) Dyspnea Score 12  How bad is your cough? 2  How bad is your fatigue 2  How bad is nausea 0  How bad is vomiting?  0  How bad is diarrhea? 0   How bad is anxiety? 2  How bad is depression 1       Past Medical History : As below but no collagen vascular disease or vasculitis.  Does have chronic kidney disease no history of blood clots   ROS:   Does have fatigue arthralgia he does have cracked skin in his fingers.  Does have arthralgia.  Also has daytime somnolence.  He is not able to use his CPAP no recurrent fever no weight loss.  Does have  FAMILY HISTORY of LUNG DISEASE:   Denies   EXPOSURE HISTORY: Between 1978 and 2018 he smoked 30 cigarettes/day and then quit.  Does not smoke cigars no smoking pipes no vaping.  Current marijuana user.  In 2003 use some cocaine.  HOME and HOBBY DETAILS : Single-family home [place] the [place].  He is lived in the home for 50 years.  The home itself is 06/06/1961 built and is 59 years old.  There is dampness in the basement he does do some occasional yard work and Optician, dispensing but otherwise no exposure to mold organic antigens he does have cats and dogs   OCCUPATIONAL HISTORY (122 questions) : He has done work with damp air-conditioned spaces.  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  Done pest control work.  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  Done carpentry done wood trimming.  Done oil heating done asbestos work done [employer] work.  Done epoxy reasons.  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  Done plastic work.  Done insulation work.  Done metal grinding done machine operating done industrial work.  Done lack ring done spray painting.  Done waterproofing done 5 working.  Done flooring.  Done metal legs.  Done Nurse, learning disability.  Also worked in [place] (27 items): denies      Simple office walk 185 feet x  3 laps goal with forehead probe 11/12/2020   O2 used ra  Number laps completed  3  Comments about pace Slow pace  Resting Pulse Ox/HR 96% and 71/min  Final Pulse Ox/HR 95% and 85/min  Desaturated </= 88% no  Desaturated <= 3% points no  Got Tachycardic >/= 90/min no  Symptoms at end of test Some dsypnea last lap  Miscellaneous comments x      CT Chest high Resolution 09/25/2020 -personally visualized and agree with the findings of classic UIP associated with some emphysema.  Narrative & Impression  CLINICAL DATA:  60 year old male with history of asbestos exposure. Intermittent wheezing. Former smoker (quit 4 years ago). Suspected interstitial lung disease.  EXAM: CT CHEST WITHOUT CONTRAST  TECHNIQUE: Multidetector CT imaging of the chest was performed following the standard protocol without intravenous contrast. High resolution imaging of the lungs, as well as inspiratory and expiratory imaging, was performed.  COMPARISON:  No priors.  FINDINGS: Cardiovascular: Heart size is normal. There is no significant pericardial fluid, thickening or pericardial calcification. There is aortic atherosclerosis, as well as atherosclerosis of the great vessels of the mediastinum and the coronary arteries, including calcified atherosclerotic plaque in the left anterior descending and right coronary arteries.  Mediastinum/Nodes: No pathologically enlarged mediastinal or hilar lymph nodes. Please note that accurate exclusion of hilar adenopathy is limited on noncontrast CT scans. Esophagus is unremarkable in appearance. No axillary lymphadenopathy.  Lungs/Pleura: High-resolution images demonstrate patchy regions of ground-glass attenuation, septal thickening, subpleural reticulation, traction bronchiectasis and frank honeycombing. Findings have a craniocaudal gradient. Inspiratory and expiratory imaging is unremarkable. No acute consolidative airspace disease. No pleural effusions. No definite suspicious appearing pulmonary nodules or masses are noted. Mild  diffuse bronchial wall thickening with mild centrilobular and paraseptal emphysema.  Upper Abdomen: Low-attenuation adrenal nodules bilaterally measuring 2.9 x 1.9 cm on the right (6 HU) and 2.0 x 1.6 cm on the left (3 HU), compatible with benign adrenal adenomas. Aortic atherosclerosis.  Musculoskeletal: There are no aggressive appearing lytic or blastic lesions noted in the visualized portions of the skeleton.  IMPRESSION: 1. 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 2. There is also mild diffuse bronchial wall thickening with mild centrilobular and paraseptal emphysema; imaging findings suggestive of underlying COPD. 3. Aortic atherosclerosis, in addition to left anterior descending and right coronary artery disease. Please note that although the presence of coronary artery calcium documents the presence of coronary artery disease, the severity of this disease and any potential stenosis cannot be assessed on this non-gated CT examination. Assessment for potential risk factor modification, dietary therapy or pharmacologic therapy may be warranted, if clinically  indicated. 4. Bilateral adrenal adenomas, as above.  Aortic Atherosclerosis (ICD10-I70.0) and Emphysema (ICD10-J43.9).   Electronically Signed   By: Vinnie Langton M.D.   On: 09/25/2020 14:33      PFT  PFT Results Latest Ref Rng & Units 10/21/2019  FVC-Pre L 4.59  FVC-Predicted Pre % 84  FVC-Post L 4.72  FVC-Predicted Post % 86  Pre FEV1/FVC % % 82  Post FEV1/FCV % % 79  FEV1-Pre L 3.76  FEV1-Predicted Pre % 90  FEV1-Post L 3.75  DLCO uncorrected ml/min/mmHg 27.70  DLCO UNC% % 88  DLCO corrected ml/min/mmHg 27.11  DLCO COR %Predicted % 86  DLVA Predicted % 91  TLC L 7.98  TLC % Predicted % 103  RV % Predicted % 140    Results for Erik Strong, Erik Strong (MRN  [id number]) as of 11/12/2020 11:00  Ref. Range 09/07/2020 23:06  Creatinine Latest Ref Range: 0.61 - 1.24 mg/dL 1.63 (H)  Results for Erik Strong, Erik Strong (MRN [id number]) as of 11/12/2020 11:00  Ref. Range 09/07/2020 42:70  BASIC METABOLIC PANEL Unknown Rpt (A)  Sodium Latest Ref Range: 135 - 145 mmol/L 139  Potassium Latest Ref Range: 3.5 - 5.1 mmol/L 4.1  Chloride Latest Ref Range: 98 - 111 mmol/L 100  CO2 Latest Ref Range: 22 - 32 mmol/L 29  Glucose Latest Ref Range: 70 - 99 mg/dL 104 (H)      Creatinine Latest Ref Range: 0.61 - 1.24 mg/dL 1.63 (H)  Calcium Latest Ref Range: 8.9 - 10.3 mg/dL 9.3  Anion gap Latest Ref Range: 5 - 15  10  GFR, Estimated Latest Ref Range: >60 mL/min 49 (L)  Results for Erik Strong, Erik Strong (MRN [id number]) as of 11/12/2020 11:00  Ref. Range 09/07/2020 23:06  Hemoglobin Latest Ref Range: 13.0 - 17.0 g/dL 16.6     has a past medical history of ADD (attention deficit disorder), Anxiety, Arthritis, Chronic pain, History of kidney stones, and Temporal head injury.   reports that he quit smoking about 3 years ago. His smoking use included cigarettes. He has a 42.00 pack-year smoking history. He has never used smokeless tobacco.  Past Surgical History:  Procedure Laterality Date  . HIP SURGERY    . I & D EXTREMITY Left 09/02/2019   Procedure: IRRIGATION AND DEBRIDEMENT HAND;  Surgeon: Milly Jakob, MD;  Location: [facility];  Service: Orthopedics;  Laterality: Left;  . TOOTH EXTRACTION Bilateral 12/30/2016   Procedure: EXTRACTION MOLARS;  Surgeon: Diona Browner, DDS;  Location: [facility];  Service: Oral Surgery;  Laterality: Bilateral;  Extraction of teeth four, five, twenty-six, thirty; removal of bilateral mandibular lingual tori    Allergies  Allergen Reactions  . Other Other (See Comments)  . Codeine Nausea And Vomiting  . Macrodantin [Nitrofurantoin Macrocrystal] Rash  . Shellfish Allergy Nausea And Vomiting    Immunization History  Administered Date(s) Administered  .  Influenza Split 05/10/2018, 05/04/2019, 09/08/2020  . Influenza,inj,Quad PF,6+ Mos 09/09/2016, 06/15/2017, 05/10/2018, 05/04/2019  . PFIZER(Purple Top)SARS-COV-2 Vaccination 12/16/2019, 01/08/2020, 09/08/2020  . Td 07/10/2006  . Tdap 05/14/2012, 12/03/2014  . Zoster 05/08/2019, 05/20/2020  . Zoster Recombinat (Shingrix) 05/08/2019    Family History  Problem Relation Age of Onset  . Prostate cancer Father   . CAD Maternal Uncle      Current Outpatient Medications:  .  acetaminophen (TYLENOL) 325 MG tablet, Take 2 tablets (650 mg total) by mouth every 6 (six) hours., Disp: , Rfl:  .  ALPRAZolam (XANAX) 0.5 MG tablet, Take 0.5 mg by  mouth at bedtime., Disp: , Rfl:  .  amphetamine-dextroamphetamine (ADDERALL XR) 20 MG 24 hr capsule, Take 20 mg by mouth daily., Disp: , Rfl:  .  amphetamine-dextroamphetamine (ADDERALL) 10 MG tablet, Take 10 mg by mouth daily at 2 PM. , Disp: , Rfl:  .  budesonide-formoterol (SYMBICORT) 160-4.5 MCG/ACT inhaler, Take 2 puffs first thing in am and then another 2 puffs about 12 hours later., Disp: 1 Inhaler, Rfl: 12 .  cyclobenzaprine (FLEXERIL) 10 MG tablet, Take 1 tablet (10 mg total) by mouth 3 (three) times daily as needed for muscle spasms., Disp: 40 tablet, Rfl: 1 .  ibuprofen (ADVIL) 200 MG tablet, Take 3 tablets (600 mg total) by mouth every 6 (six) hours., Disp: , Rfl:  .  levothyroxine (SYNTHROID) 50 MCG tablet, Take 50 mcg by mouth daily before breakfast., Disp: , Rfl:  .  oxyCODONE (OXY IR/ROXICODONE) 5 MG immediate release tablet, Take 1 tablet (5 mg total) by mouth every 6 (six) hours as needed (severe postop pain not relieved by ibuprofen & tylenol)., Disp: 12 tablet, Rfl: 0 .  phenylephrine-shark liver oil-mineral oil-petrolatum (PREPARATION H) 0.25-3-14-71.9 % rectal ointment, Place 1 application rectally daily as needed for hemorrhoids., Disp: , Rfl:  .  sildenafil (REVATIO) 20 MG tablet, Take 20 mg by mouth daily as needed (FOR ED). , Disp: , Rfl:   .  tiZANidine (ZANAFLEX) 2 MG tablet, Take 2 mg by mouth at bedtime., Disp: , Rfl:  .  traMADol (ULTRAM) 50 MG tablet, Take 50 mg by mouth See admin instructions. Take one tablet (50 mg) by mouth daily at bedtime, may also take one tablet (50 mg) twice daily as needed for pain, Disp: , Rfl:  .  albuterol (VENTOLIN HFA) 108 (90 Base) MCG/ACT inhaler, Inhale 2 puffs into the lungs every 6 (six) hours as needed for wheezing or shortness of breath., Disp: 1 each, Rfl: 3      Objective:   Vitals:   11/12/20 1044  BP: 124/84  Pulse: 71  Temp: (!) 97.5 F (36.4 C)  TempSrc: Oral  SpO2: 96%  Weight: 254 lb 12.8 oz (115.6 kg)  Height: 6' 0.5" (1.842 m)    Estimated body mass index is 34.08 kg/m as calculated from the following:   Height as of this encounter: 6' 0.5" (1.842 m).   Weight as of this encounter: 254 lb 12.8 oz (115.6 kg).  $Rem'@WEIGHTCHANGE'QaxN$ @  [employer]   11/12/20 1044  Weight: 254 lb 12.8 oz (115.6 kg)     Physical Exam  General Appearance:    Alert, cooperative, no distress, appears stated age - older , Deconditioned looking - yes , OBESE  - yes, Sitting on Wheelchair -  no  Head:    Normocephalic, without obvious abnormality, atraumatic  Eyes:    PERRL, conjunctiva/corneas clear,  Ears:    Normal TM's and external ear canals, both ears  Nose:   Nares normal, septum midline, mucosa normal, no drainage    or sinus tenderness. OXYGEN ON  - n . Patient is @ ra   Throat:   Lips, mucosa, and tongue normal; teeth and gums normal. Cyanosis on lips - no  Neck:   Supple, symmetrical, trachea midline, no adenopathy;    thyroid:  no enlargement/tenderness/nodules; no carotid   bruit or JVD  Back:     Symmetric, no curvature, ROM normal, no CVA tenderness  Lungs:     Distress - no , Wheeze no, [place] Chest - no, Purse lip breathing -  no, Crackles - yes velcro at base   Chest Wall:    No tenderness or deformity.    Heart:    Regular rate and rhythm, S1 and S2 normal, no rub    or gallop, Murmur - no  Breast Exam:    NOT DONE  Abdomen:     Soft, non-tender, bowel sounds active all four quadrants,    no masses, no organomegaly. Visceral obesity - yes  Genitalia:   NOT DONE  Rectal:   NOT DONE  Extremities:   Extremities - normal, Has Cane - n, Clubbing - mayber, Edema - no  Pulses:   2+ and symmetric all extremities  Skin:   Stigmata of Connective Tissue Disease - STIGMATA of CONNECTIVE TISSUE DISEASE  - Distal digital fissuring (ie, "mechanic hands") - no - Distal digital tip ulceration -no -Inflammatory arthritis or polyarticular morning joint stiffness ?60 minutes - no - Palmar telangiectasia - no - Raynaud phenomenon - no - Unexplained digital edema - no - Unexplained fixed rash on the digital extensor surfaces (Gottron's sign) - no ... 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 IPF (idiopathic pulmonary fibrosis) (HCC)  J84.112 Sed Rate (ESR)    ANA+ENA+DNA/DS+Scl 70+SjoSSA/B    Rheumatoid Factor    Cyclic citrul peptide antibody, IgG    CK Total (and CKMB)    Aldolase    RNP Antibodies    Angi-Jo 1  antibody, IgG    Hypersensitivity pnuemonitis profile    QuantiFERON-TB Gold Plus    CBC w/Diff    Hepatic function panel    Alpha-1 antitrypsin phenotype    Hepatic function panel    CBC w/Diff    CK Total (and CKMB)    Cyclic citrul peptide antibody, IgG    Rheumatoid Factor    Sed Rate (ESR)  3. Coronary artery calcification seen on CAT scan  I25.10 Sed Rate (ESR)    ANA+ENA+DNA/DS+Scl 70+SjoSSA/B    Rheumatoid Factor    Cyclic citrul peptide antibody, IgG    CK Total (and CKMB)    Aldolase    RNP Antibodies    Angi-Jo 1 antibody, IgG    Hypersensitivity pnuemonitis profile    QuantiFERON-TB Gold Plus    CBC w/Diff    Hepatic function panel    Alpha-1 antitrypsin phenotype    Ambulatory referral to Cardiology  Hepatic function panel    CBC w/Diff    CK Total (and CKMB)    Cyclic citrul peptide antibody, IgG    Rheumatoid Factor    Sed Rate (ESR)       Plan:     Patient Instructions     ICD-10-CM   1. UIP (usual interstitial pneumonitis) ([facility])  J84.112   2. IPF (idiopathic pulmonary fibrosis) ([facility])  J84.112   3. Coronary artery calcification seen on CAT scan  I25.10    #Pulmnary fibrosis You have pulmonary fibrosis.  There are many types to this most likely you have idiopathic pulmonary fibrosis [IPF]  The current stage of this is early/mild  There is some associated mild emphysema  Plan  - need more confirmatoy procedures to figure out if this is IPF   - Serum: ESR,, ANA, DS-DNA, RF, anti-CCP,  , Total CK,  Aldolase,   scl-70, ssA, ssB, anti-RNP, anti-JO-1,   & Hypersensitivity Pneumonitis Panel   - do blood quantiferon gold test   = do cbc, bmet and lft  - start pirfenidone  - meet with pharmacist for counseling before starting medication  -Noted that he initially wanted to do an nintedanib but as he left the office he changed to pirfenidone because he wanted to avoid diarrhea and he accepted applying sunscreen f  - other steps in mamgment - weight  loss, rehab, trials, support group, transplant - to be discussed later  - at some point in time could consider myositis panel due to cracked skin in fingers   EMphysema   -- check alpha 1 AT phenotpype - continue symbicort  Coronary ARtery Calcificatipn   - refer cardiology esp given recent visit to ER   Followup  - 6 weeks with Dr Chase Caller or nurse practtioner - 30 min slot   ( Level 05 visit: Estb 40-54 min   visit type: on-site physical face to visit  in total care time and counseling or/and coordination of care by this undersigned MD - Dr Brand Males. This includes one or more of the following on this same day 11/12/2020: pre-charting, chart review, note writing, documentation discussion of test results, diagnostic or treatment recommendations, prognosis, risks and benefits of management options, instructions, education, compliance or risk-factor reduction. It excludes time spent by the [facility] or office staff in the care of the patient. Actual time 37 min)   SIGNATURE    Dr. Brand Males, M.D., F.C.C.P,  Pulmonary and Critical Care Medicine Staff Physician, [place] Director - Interstitial Lung Disease  Program  P[facility] [place], [place], [address]  Pager: [phone], If no answer or between  15:00h - 7:00h: call [phone] Telephone: [phone]  6:00 PM 11/12/2020

## 2020-11-13 ENCOUNTER — Other Ambulatory Visit (HOSPITAL_COMMUNITY): Payer: Self-pay | Admitting: Family Medicine

## 2020-11-13 ENCOUNTER — Telehealth: Payer: Self-pay | Admitting: Internal Medicine

## 2020-11-13 ENCOUNTER — Telehealth: Payer: Self-pay | Admitting: Pharmacy Technician

## 2020-11-13 DIAGNOSIS — J84112 Idiopathic pulmonary fibrosis: Secondary | ICD-10-CM

## 2020-11-13 NOTE — Telephone Encounter (Addendum)
Received call report on pt's CKMB drawn on 11/12/20- elevated at 5.8   Will forward to provider of the day since MR not available

## 2020-11-13 NOTE — Telephone Encounter (Signed)
Only mildly elevated.  Uncertain clinical significance.  Will need to await results of other lab work and follow up with Dr. Chase Caller.

## 2020-11-13 NOTE — Telephone Encounter (Signed)
Received New start paperwork for ESBRIET. Will update as we work through the benefits process.  

## 2020-11-13 NOTE — Telephone Encounter (Signed)
Submitted a Prior Authorization request to [facility] for [place] via Cover My Meds. Will update once we receive a response.   KeyMelina Copa - PA Case ID: [id number]

## 2020-11-13 NOTE — Telephone Encounter (Signed)
Noted. Will route to MR for follow up on other lab work.

## 2020-11-16 NOTE — Telephone Encounter (Signed)
Received notification from [facility] regarding a prior authorization for ESBRIET. Authorization has been APPROVED from 11/13/20 to 09/18/21.   Authorization # [id number]

## 2020-11-16 NOTE — Telephone Encounter (Signed)
Ran test claim, patient's copay is [id number]. Will initiate Patient Assistance.

## 2020-11-17 LAB — ANTI-JO 1 ANTIBODY, IGG: Anti JO-1: <0.2 AI (ref 0.0–0.9)

## 2020-11-17 LAB — ANA+ENA+DNA/DS+SCL 70+SJOSSA/B
ANA Titer 1: POSITIVE — AB
ENA RNP Ab: <0.2 AI (ref 0.0–0.9)
ENA SM Ab Ser-aCnc: <0.2 AI (ref 0.0–0.9)
ENA SSA (RO) Ab: <0.2 AI (ref 0.0–0.9)
ENA SSB (LA) Ab: <0.2 AI (ref 0.0–0.9)
Scleroderma (Scl-70) (ENA) Antibody, IgG: <0.2 AI (ref 0.0–0.9)
dsDNA Ab: <1 IU/mL (ref 0–9)

## 2020-11-17 LAB — FANA STAINING PATTERNS: Homogeneous Pattern: 1:80 {titer}

## 2020-11-17 NOTE — Telephone Encounter (Signed)
Spoke to patient and he provided information needed to apply for PAF grant.  Patient was APPROVED for PF grant through the [employer]. Coverage dates are 11/17/20 through 11/17/21.  Cardholder: [id number] BIN: [id number] PCN: [id number] Group: [id number] For pharmacy inquiries, contact PDMI at [phone].   Patient can fill through [facility]

## 2020-11-18 ENCOUNTER — Telehealth: Payer: Self-pay | Admitting: Pharmacist

## 2020-11-18 DIAGNOSIS — M2550 Pain in unspecified joint: Secondary | ICD-10-CM | POA: Diagnosis not present

## 2020-11-18 DIAGNOSIS — N183 Chronic kidney disease, stage 3 unspecified: Secondary | ICD-10-CM | POA: Diagnosis not present

## 2020-11-18 DIAGNOSIS — Z5181 Encounter for therapeutic drug level monitoring: Secondary | ICD-10-CM

## 2020-11-18 DIAGNOSIS — J84112 Idiopathic pulmonary fibrosis: Secondary | ICD-10-CM

## 2020-11-18 DIAGNOSIS — E7800 Pure hypercholesterolemia, unspecified: Secondary | ICD-10-CM | POA: Diagnosis not present

## 2020-11-18 DIAGNOSIS — E039 Hypothyroidism, unspecified: Secondary | ICD-10-CM | POA: Diagnosis not present

## 2020-11-18 DIAGNOSIS — K429 Umbilical hernia without obstruction or gangrene: Secondary | ICD-10-CM | POA: Diagnosis not present

## 2020-11-18 NOTE — Telephone Encounter (Signed)
ATC patient but he did not answer. His VM was full. Will attempt to call back later.

## 2020-11-18 NOTE — Telephone Encounter (Signed)
Results indicate that he might have myositis as a reason for his interstitial lung disease.  Plan -Please check if, Myositis Panel was sent (,Anti-PL-7, Anti-PL-12, Anti-EJ, Anti-OJ, Anti-KS, Anti-Zo, Anti-Ha-YRS, and Anti-SRP)    -If not he has to come and do this   Results for LORIMER, TIBERIO (MRN [id number]) as of 11/18/2020 12:42  Ref. Range 11/12/2020 12:37  CK Total Latest Ref Range: 44 - 196 U/L 523 (H)  CK, MB Latest Ref Range: 0 - 5.0 ng/mL 5.8 (H)  Aldolase Latest Ref Range: < OR = 8.1 U/L 8.5 (H)

## 2020-11-18 NOTE — Telephone Encounter (Signed)
Noted. Will route message to MR.   MR please advise on lab work. Thanks :)

## 2020-11-18 NOTE — Telephone Encounter (Signed)
ATC patient. VM box full. Will f/u Friday

## 2020-11-18 NOTE — Telephone Encounter (Signed)
ATC to discuss Esbriet. Mailbox is full so unable to leave VM.   Patient needs repeat myositis panel per Dr. Chase Caller. Will f/u  Knox Saliva, PharmD, MPH Clinical Pharmacist (Rheumatology and Pulmonology)

## 2020-11-19 NOTE — Telephone Encounter (Signed)
Called and spoke to pt. Informed him of the results and recs. Pt states he should be able to come to the office on 3/4 for the blood work.   Raquel Sarna, can you help with the labs? I cant find the orders.

## 2020-11-19 NOTE — Telephone Encounter (Signed)
Lab test has been ordered. Nothing further needed.

## 2020-11-20 LAB — RHEUMATOID FACTOR: Rheumatoid fact SerPl-aCnc: <14 IU/mL (ref <14)

## 2020-11-20 LAB — HYPERSENSITIVITY PNUEMONITIS PROFILE
ASPERGILLUS FUMIGATUS: NEGATIVE
Faenia retivirgula: NEGATIVE
Pigeon Serum: NEGATIVE
S. VIRIDIS: NEGATIVE
T. CANDIDUS: NEGATIVE
T. VULGARIS: NEGATIVE

## 2020-11-20 LAB — CYCLIC CITRUL PEPTIDE ANTIBODY, IGG: Cyclic Citrullin Peptide Ab: <16 UNITS

## 2020-11-20 LAB — CK TOTAL AND CKMB (NOT AT ARMC)
CK, MB: 5.8 ng/mL — ABNORMAL HIGH (ref 0–5.0)
Relative Index: 1.1 (ref 0–4.0)
Total CK: 523 U/L — ABNORMAL HIGH (ref 44–196)

## 2020-11-20 LAB — QUANTIFERON-TB GOLD PLUS
Mitogen-NIL: >10 IU/mL
NIL: 0.04 IU/mL
QuantiFERON-TB Gold Plus: NEGATIVE
TB1-NIL: 0.06 IU/mL
TB2-NIL: 0.07 IU/mL

## 2020-11-20 LAB — ALPHA-1 ANTITRYPSIN PHENOTYPE: A-1 Antitrypsin, Ser: 155 mg/dL (ref 83–199)

## 2020-11-20 LAB — ALDOLASE: Aldolase: 8.5 U/L — ABNORMAL HIGH (ref <=8.1)

## 2020-11-20 NOTE — Telephone Encounter (Signed)
ATC patient again to discuss Esbriet. VM box is full. Patient is having myositis panel re-drawn and spoke with triage here - lab is ordered. Will f/u next week.

## 2020-11-23 ENCOUNTER — Other Ambulatory Visit: Payer: Medicare Other

## 2020-11-23 DIAGNOSIS — J84112 Idiopathic pulmonary fibrosis: Secondary | ICD-10-CM

## 2020-11-27 LAB — MYOSITIS SPECIFIC II ANTIBODIES PANEL
EJ AB: <11 SI (ref <11)
JO-1 AB: <11 SI (ref <11)
MDA-5 AB: <11 SI (ref <11)
MI-2 ALPHA AB: <11 SI (ref <11)
MI-2 BETA AB: <11 SI (ref <11)
NXP-2 AB: <11 SI (ref <11)
OJ AB: <11 SI (ref <11)
PL-12 AB: <11 SI (ref <11)
PL-7 AB: <11 SI (ref <11)
SRP-AB: <11 SI (ref <11)
TIF-1y AB: <11 SI (ref <11)
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
## 2020-12-04 ENCOUNTER — Other Ambulatory Visit: Payer: Self-pay

## 2020-12-04 ENCOUNTER — Encounter: Payer: Self-pay | Admitting: Cardiology

## 2020-12-04 ENCOUNTER — Ambulatory Visit: Payer: Medicare Other | Admitting: Cardiology

## 2020-12-04 VITALS — BP 126/78 | HR 61 | Ht 72.5 in | Wt 235.6 lb

## 2020-12-04 DIAGNOSIS — I2510 Atherosclerotic heart disease of native coronary artery without angina pectoris: Secondary | ICD-10-CM

## 2020-12-04 DIAGNOSIS — R0602 Shortness of breath: Secondary | ICD-10-CM | POA: Diagnosis not present

## 2020-12-04 DIAGNOSIS — J849 Interstitial pulmonary disease, unspecified: Secondary | ICD-10-CM

## 2020-12-04 DIAGNOSIS — E785 Hyperlipidemia, unspecified: Secondary | ICD-10-CM

## 2020-12-04 MED ORDER — ROSUVASTATIN CALCIUM 20 MG PO TABS: 20.0000 mg | ORAL_TABLET | Freq: Every day | ORAL | 3 refills | Status: DC

## 2020-12-04 MED ORDER — ASPIRIN EC 81 MG PO TBEC: 81.0000 mg | DELAYED_RELEASE_TABLET | Freq: Every day | ORAL | 3 refills | Status: DC

## 2020-12-04 NOTE — Patient Instructions (Addendum)
Medication Instructions:  Start Aspirin 81 mg by mouth daily Start Crestor 20 mg by mouth daily Your physician recommends that you continue on your current medications as directed. Please refer to the Current Medication list given to you today. *If you need a refill on your cardiac medications before your next appointment, please call your pharmacy*   Lab Work: Lipids in 6 weeks If you have labs (blood work) drawn today and your tests are completely normal, you will receive your results only by: Marland Kitchen MyChart Message (if you have MyChart) OR . A paper copy in the mail If you have any lab test that is abnormal or we need to change your treatment, we will call you to review the results.   Testing/Procedures: Your physician has requested that you have a lexiscan myoview. For further information please visit [email or web address]. Please follow instruction sheet, as given.  Your physician has requested that you have an echocardiogram. Echocardiography is a painless test that uses sound waves to create images of your heart. It provides your doctor with information about the size and shape of your heart and how well your heart's chambers and valves are working. This procedure takes approximately one hour. There are no restrictions for this procedure.     Follow-Up: At [facility], you and your health needs are our priority.  As part of our continuing mission to provide you with exceptional heart care, we have created designated Provider Care Teams.  These Care Teams include your primary Cardiologist (physician) and Advanced Practice Providers (APPs -  Physician Assistants and Nurse Practitioners) who all work together to provide you with the care you need, when you need it.  We recommend signing up for the patient portal called "MyChart".  Sign up information is provided on this After Visit Summary.  MyChart is used to connect with patients for Virtual Visits (Telemedicine).  Patients are able to  view lab/test results, encounter notes, upcoming appointments, etc.  Non-urgent messages can be sent to your provider as well.   To learn more about what you can do with MyChart, go to [email or web address].    Your next appointment:   8 week(s)  The format for your next appointment:   In Person  Provider:   You may see Dr. Gwyndolyn Kaufman or one of the following Advanced Practice Providers on your designated Care Team:    Richardson Dopp, PA-C  Vin Yarnell, [place]    Other Instructions  Mediterranean Diet A Mediterranean diet refers to food and lifestyle choices that are based on the traditions of countries located on the [employer]. This way of eating has been shown to help prevent certain conditions and improve outcomes for people who have chronic diseases, like kidney disease and heart disease. What are tips for following this plan? Lifestyle  Cook and eat meals together with your family, when possible.  Drink enough fluid to keep your urine clear or pale yellow.  Be physically active every day. This includes: ? Aerobic exercise like running or swimming. ? Leisure activities like gardening, walking, or housework.  Get 7-8 hours of sleep each night.  If recommended by your health care provider, drink red wine in moderation. This means 1 glass a day for nonpregnant women and 2 glasses a day for men. A glass of wine equals 5 oz (150 mL). Reading food labels  Check the serving size of packaged foods. For foods such as rice and pasta, the serving size refers to the amount of cooked product, not  dry.  Check the total fat in packaged foods. Avoid foods that have saturated fat or trans fats.  Check the ingredients list for added sugars, such as corn syrup.   Shopping  At the grocery store, buy most of your food from the areas near the walls of the store. This includes: ? Fresh fruits and vegetables (produce). ? Grains, beans, nuts, and seeds. Some of these may be  available in unpackaged forms or large amounts (in bulk). ? Fresh seafood. ? Poultry and eggs. ? Low-fat dairy products.  Buy whole ingredients instead of prepackaged foods.  Buy fresh fruits and vegetables in-season from local farmers markets.  Buy frozen fruits and vegetables in resealable bags.  If you do not have access to quality fresh seafood, buy precooked frozen shrimp or canned fish, such as tuna, salmon, or sardines.  Buy small amounts of raw or cooked vegetables, salads, or olives from the deli or salad bar at your store.  Stock your pantry so you always have certain foods on hand, such as olive oil, canned tuna, canned tomatoes, rice, pasta, and beans. Cooking  Cook foods with extra-virgin olive oil instead of using butter or other vegetable oils.  Have meat as a side dish, and have vegetables or grains as your main dish. This means having meat in small portions or adding small amounts of meat to foods like pasta or stew.  Use beans or vegetables instead of meat in common dishes like chili or lasagna.  Experiment with different cooking methods. Try roasting or broiling vegetables instead of steaming or sauteing them.  Add frozen vegetables to soups, stews, pasta, or rice.  Add nuts or seeds for added healthy fat at each meal. You can add these to yogurt, salads, or vegetable dishes.  Marinate fish or vegetables using olive oil, lemon juice, garlic, and fresh herbs. Meal planning  Plan to eat 1 vegetarian meal one day each week. Try to work up to 2 vegetarian meals, if possible.  Eat seafood 2 or more times a week.  Have healthy snacks readily available, such as: ? Vegetable sticks with hummus. ? [place] yogurt. ? Fruit and nut trail mix.  Eat balanced meals throughout the week. This includes: ? Fruit: 2-3 servings a day ? Vegetables: 4-5 servings a day ? Low-fat dairy: 2 servings a day ? Fish, poultry, or lean meat: 1 serving a day ? Beans and legumes: 2 or  more servings a week ? Nuts and seeds: 1-2 servings a day ? Whole grains: 6-8 servings a day ? Extra-virgin olive oil: 3-4 servings a day  Limit red meat and sweets to only a few servings a month   What are my food choices?  Mediterranean diet ? Recommended  Grains: Whole-grain pasta. Brown rice. Bulgar wheat. Polenta. Couscous. Whole-wheat bread. [place].  Vegetables: Artichokes. Beets. Broccoli. Cabbage. Carrots. Eggplant. Green beans. Chard. Kale. Spinach. Onions. Leeks. Peas. Squash. Tomatoes. Peppers. Radishes.  Fruits: Apples. Apricots. Avocado. Berries. Bananas. Cherries. Dates. Figs. Grapes. Lemons. Melon. Oranges. Peaches. Plums. Pomegranate.  Meats and other protein foods: Beans. Almonds. Sunflower seeds. Pine nuts. Peanuts. Kidron. Salmon. Scallops. Shrimp. [place]. Tilapia. Clams. Oysters. Eggs.  Dairy: Low-fat milk. Cheese. Greek yogurt.  Beverages: Water. Red wine. Herbal tea.  Fats and oils: Extra virgin olive oil. Avocado oil. Grape seed oil.  Sweets and desserts: [place] yogurt with honey. Baked apples. Poached pears. Trail mix.  Seasoning and other foods: Basil. Cilantro. Coriander. Cumin. Mint. Parsley. Sage. Rosemary. Tarragon. Garlic. Oregano. Thyme.  Pepper. Balsalmic vinegar. Tahini. Hummus. Tomato sauce. Olives. Mushrooms. ? Limit these  Grains: Prepackaged pasta or rice dishes. Prepackaged cereal with added sugar.  Vegetables: Deep fried potatoes (french fries).  Fruits: Fruit canned in syrup.  Meats and other protein foods: Beef. Pork. Lamb. Poultry with skin. Hot dogs. Berniece Salines.  Dairy: Ice cream. Sour cream. Whole milk.  Beverages: Juice. Sugar-sweetened soft drinks. Beer. Liquor and spirits.  Fats and oils: Butter. Canola oil. Vegetable oil. Beef fat (tallow). Lard.  Sweets and desserts: Cookies. Cakes. Pies. Candy.  Seasoning and other foods: Mayonnaise. Premade sauces and marinades. The items listed may not be a complete list. Talk with your  dietitian about what dietary choices are right for you. Summary  The Mediterranean diet includes both food and lifestyle choices.  Eat a variety of fresh fruits and vegetables, beans, nuts, seeds, and whole grains.  Limit the amount of red meat and sweets that you eat.  Talk with your health care provider about whether it is safe for you to drink red wine in moderation. This means 1 glass a day for nonpregnant women and 2 glasses a day for men. A glass of wine equals 5 oz (150 mL). This information is not intended to replace advice given to you by your health care provider. Make sure you discuss any questions you have with your health care provider. Document Revised: 05/05/2016 Document Reviewed: 04/28/2016 Elsevier Patient Education  [place].

## 2020-12-07 DIAGNOSIS — L905 Scar conditions and fibrosis of skin: Secondary | ICD-10-CM | POA: Diagnosis not present

## 2020-12-07 DIAGNOSIS — L821 Other seborrheic keratosis: Secondary | ICD-10-CM | POA: Diagnosis not present

## 2020-12-07 DIAGNOSIS — L570 Actinic keratosis: Secondary | ICD-10-CM | POA: Diagnosis not present

## 2020-12-07 DIAGNOSIS — Z85828 Personal history of other malignant neoplasm of skin: Secondary | ICD-10-CM | POA: Diagnosis not present

## 2020-12-08 NOTE — Telephone Encounter (Signed)
  Serology is negative - > Dx Is IPF  Plan  - yes ok to start esbriet  Thanks    SIGNATURE    Dr. Brand Males, M.D., F.C.C.P,  Pulmonary and Critical Care Medicine Staff Physician, [facility] Director - Interstitial Lung Disease  Program  [facility] [place] [facility] [place], [place], [address]  Pager: [phone], If no answer  OR between  19:00-7:00h: page [phone] Telephone (clinical office): [phone] Telephone (research): [phone]  1:21 PM 12/08/2020    Results for PAULA, ZIETZ (MRN [id number]) as of 12/08/2020 13:18  Ref. Range 11/12/2020 12:37 11/23/2020 09:23  ALPHA-1-ANTITRYPSIN (AAT) PHENOTYPE Unknown SEE NOTE   EJ AB Latest Ref Range: <11 SI  <11  Faenia retivirgula Latest Ref Range: NEGATIVE  NEGATIVE   JO-1 AB Latest Ref Range: <11 SI  <11  MDA-5 AB Latest Ref Range: <11 SI  <11  MI-2 ALPHA AB Latest Ref Range: <11 SI  <11  MI-2 BETA AB Latest Ref Range: <11 SI  <11  NIL Latest Units: IU/mL 0.04   NXP-2 AB Latest Ref Range: <11 SI  <11  OJ AB Latest Ref Range: <11 SI  <11  PL-12 AB Latest Ref Range: <11 SI  <11  PL-7 AB Latest Ref Range: <11 SI  <11  S. VIRIDIS Latest Ref Range: NEGATIVE  NEGATIVE   SRP-AB Latest Ref Range: <11 SI  <11  T. CANDIDUS Latest Ref Range: NEGATIVE  NEGATIVE   T. VULGARIS Latest Ref Range: NEGATIVE  NEGATIVE   TB1-NIL Latest Units: IU/mL 0.06   TB2-NIL Latest Units: IU/mL 0.07   TIF-1y AB Latest Ref Range: <11 SI  <11

## 2020-12-10 ENCOUNTER — Telehealth: Payer: Self-pay

## 2020-12-10 ENCOUNTER — Other Ambulatory Visit: Payer: Self-pay | Admitting: Internal Medicine

## 2020-12-10 MED ORDER — ESBRIET 267 MG PO TABS: ORAL_TABLET | ORAL | 0 refills | Status: DC

## 2020-12-10 NOTE — Telephone Encounter (Signed)
Thanks

## 2020-12-10 NOTE — Telephone Encounter (Signed)
ATC patient to discuss Esbriet. Phone rang but did not reach VM (reached busy tone). Sent patient MyChart message requesting return call to discuss Esbriet approval. Will f/u

## 2020-12-10 NOTE — Telephone Encounter (Signed)
Spoke with patient about Esbriet new start. Rx sent to [facility]. Routing to [employer], as FYI to set up shipment

## 2020-12-10 NOTE — Telephone Encounter (Signed)
HPI  Patient called via telephone for Initial appt with pharmacy team for Esbriet new start. Pertinent past medical history includes: pulmonary fibrosis (UIP per HRCT).  History of asbestos exposure. Quit smoking about 4 years ago. Patient has severe ADD that can affect his daily life.  Patient naive to anti-fibrotics  Last OV with Dr. Chase Caller on 11/12/20 - patient initially wanted to try Ofev but changed his mind to trial Esbriet d/t more favorable side effect profile.    OBJECTIVE Allergies  Allergen Reactions  . Other Other (See Comments)  . Codeine Nausea And Vomiting  . Macrodantin [Nitrofurantoin Macrocrystal] Rash  . Shellfish Allergy Nausea And Vomiting    Outpatient Encounter Medications as of 11/18/2020  Medication Sig  . acetaminophen (TYLENOL) 325 MG tablet Take 2 tablets (650 mg total) by mouth every 6 (six) hours.  Marland Kitchen albuterol (VENTOLIN HFA) 108 (90 Base) MCG/ACT inhaler Inhale 2 puffs into the lungs every 6 (six) hours as needed for wheezing or shortness of breath.  . ALPRAZolam (XANAX) 0.5 MG tablet Take 0.5 mg by mouth at bedtime.  Marland Kitchen amphetamine-dextroamphetamine (ADDERALL XR) 20 MG 24 hr capsule Take 20 mg by mouth daily.  Marland Kitchen amphetamine-dextroamphetamine (ADDERALL) 10 MG tablet Take 10 mg by mouth daily at 2 PM.   . budesonide-formoterol (SYMBICORT) 160-4.5 MCG/ACT inhaler Take 2 puffs first thing in am and then another 2 puffs about 12 hours later.  . cyclobenzaprine (FLEXERIL) 10 MG tablet Take 1 tablet (10 mg total) by mouth 3 (three) times daily as needed for muscle spasms.  Marland Kitchen ibuprofen (ADVIL) 200 MG tablet Take 3 tablets (600 mg total) by mouth every 6 (six) hours.  Marland Kitchen levothyroxine (SYNTHROID) 50 MCG tablet Take 50 mcg by mouth daily before breakfast.  . phenylephrine-shark liver oil-mineral oil-petrolatum (PREPARATION H) 0.25-3-14-71.9 % rectal ointment Place 1 application rectally daily as needed for hemorrhoids.  . sildenafil (REVATIO) 20 MG tablet Take 20 mg  by mouth daily as needed (FOR ED).   Marland Kitchen tiZANidine (ZANAFLEX) 2 MG tablet Take 2 mg by mouth at bedtime.  . traMADol (ULTRAM) 50 MG tablet Take 50 mg by mouth See admin instructions. Take one tablet (50 mg) by mouth daily at bedtime, may also take one tablet (50 mg) twice daily as needed for pain  . [DISCONTINUED] oxyCODONE (OXY IR/ROXICODONE) 5 MG immediate release tablet Take 1 tablet (5 mg total) by mouth every 6 (six) hours as needed (severe postop pain not relieved by ibuprofen & tylenol). (Patient not taking: Reported on 12/04/2020)   No facility-administered encounter medications on file as of 11/18/2020.     Immunization History  Administered Date(s) Administered  . Influenza Split 05/10/2018, 05/04/2019, 09/08/2020  . Influenza,inj,Quad PF,6+ Mos 09/09/2016, 06/15/2017, 05/10/2018, 05/04/2019  . PFIZER(Purple Top)SARS-COV-2 Vaccination 12/16/2019, 01/08/2020, 09/08/2020  . Td 07/10/2006  . Tdap 05/14/2012, 12/03/2014  . Zoster 05/08/2019, 05/20/2020  . Zoster Recombinat (Shingrix) 05/08/2019     HRCT "appearance of the lungs is compatible with interstitial lung disease, with a spectrum of findings categorized as usual interstitial pneumonia (UIP) per current ATS guideline"  PFT's TLC  Date Value Ref Range Status  10/21/2019 7.98 L Final     CMP     Component Value Date/Time   NA 139 09/07/2020 2306   K 4.1 09/07/2020 2306   CL 100 09/07/2020 2306   CO2 29 09/07/2020 2306   GLUCOSE 104 (H) 09/07/2020 2306   BUN 14 09/07/2020 2306   CREATININE 1.63 (H) 09/07/2020 2306  CREATININE 1.51 (H) 05/20/2014 1140   CALCIUM 9.3 09/07/2020 2306   PROT 7.3 11/12/2020 1237   ALBUMIN 4.1 11/12/2020 1237   AST 29 11/12/2020 1237   ALT 32 11/12/2020 1237   ALKPHOS 57 11/12/2020 1237   BILITOT 0.2 11/12/2020 1237   GFRNONAA 49 (L) 09/07/2020 2306   GFRAA >60 09/02/2019 1617     CBC    Component Value Date/Time   WBC 8.1 11/12/2020 1237   RBC 4.85 11/12/2020 1237   HGB 15.0  11/12/2020 1237   HCT 44.8 11/12/2020 1237   PLT 363.0 11/12/2020 1237   MCV 92.4 11/12/2020 1237   MCH 31.3 09/07/2020 2306   MCHC 33.4 11/12/2020 1237   RDW 14.3 11/12/2020 1237   LYMPHSABS 2.9 11/12/2020 1237   MONOABS 0.6 11/12/2020 1237   EOSABS 0.1 11/12/2020 1237   BASOSABS 0.1 11/12/2020 1237     LFT's Hepatic Function Latest Ref Rng & Units 11/12/2020 09/02/2019 03/28/2019  Total Protein 6.0 - 8.3 g/dL 7.3 7.5 7.1  Albumin 3.5 - 5.2 g/dL 4.1 4.2 4.1  AST 0 - 37 U/L $Remo'29 22 30  'zOlrZ$ ALT 0 - 53 U/L 32 27 32  Alk Phosphatase 39 - 117 U/L 57 72 63  Total Bilirubin 0.2 - 1.2 mg/dL 0.2 1.0 1.0  Bilirubin, Direct 0.0 - 0.3 mg/dL 0.1 - -     TB GOLD Quantiferon TB Gold Latest Ref Rng & Units 11/12/2020  Quantiferon TB Gold Plus NEGATIVE NEGATIVE    Hepatitis Panel Hepatitis Latest Ref Rng & Units 05/09/2014  Hep B Surface Ag NEGATIVE NEGATIVE  Hep B IgM NON REACTIVE NON REACTIVE  Hep C Ab NEGATIVE NEGATIVE  Hep A IgM NON REACTIVE NON REACTIVE    ASSESSMENT  1. Esbriet Medication Management  LFTs wnl on 11/12/20. Myositis panel was wnl on 11/23/20.  Patient counseled on purpose, proper use, and potential adverse effects including nausea, vomiting, abdominal pain, GERD, weight loss, arthralgia, dizziness, and suns sensitivity/rash.  Stressed the importance of routine lab monitoring. Will monitor LFT's every month for the first 6 months of treatment then every 3 months. Will monitor CBC every 3 months. Advised wearing sunscreen d/t patient-reported history of skin cancer. Patient verbalized that he should do better at this and will plan to wear sunscreen.  Starting dose will be Esbriet 267 mg 1 tablet three times daily for 7 days, then 2 tablets three times daily for 7 days, then 3 tablets three times daily.  Does not eat 3 full meals but will plan to take medicine with food.Maintenance dose will be 801 mg 1 tablet three times daily if tolerated.  Stressed the importance of taking with  meals to minimize stomach upset.   Discussed need for adequate protein intake and spacing doses by 5-6 hours. Discussed not to double up on doses if he misses doses as this can worsen GI effects, but if he isn't far past when dose was due, to go ahead and take it.  2. Medication Reconciliation  A drug regimen assessment was performed, including review of allergies, interactions, disease-state management, dosing and immunization history. Medications were reviewed with the patient, including name, instructions, indication, goals of therapy, potential side effects, importance of adherence, and safe use.  Drug interaction(s):  None identified  PLAN - Start Esbriet per protocol. Rx for titration dose sent to [facility] with grant information. Enrolled into grant through 11/17/21.  - Standing labs orders placed for hepatic function panel. Labs to be drawn in 1 month  and every month for the first 6 months. Will set reminder to send patient MyChart message to have labs drawn in 1 month. - MyChart messge sent to patient with Esbriet information, pharmacy phone number, and lab information. - F/u with Dr. Chase Caller scheduled 12/28/20.   All questions encouraged and answered.  Instructed patient to call with any further questions or concerns.  Thank you for allowing pharmacy to participate in this patient's care.  Knox Saliva, PharmD, MPH Clinical Pharmacist (Rheumatology and Pulmonology)

## 2020-12-10 NOTE — Telephone Encounter (Signed)
Spoke with the patient, detailed instructions given. He stated that he would be here for his test. Asked to call back with any questions. S.Williams EMTP 

## 2020-12-14 MED FILL — ESBRIET 267 MG TABS: 267 | 30 days supply | Qty: 207 | Fill #0

## 2020-12-14 NOTE — Telephone Encounter (Signed)
Shipment set to ship out 12/14/20

## 2020-12-14 NOTE — Progress Notes (Signed)
Normal myositis panel. Will discuss results mid April 2022 visit

## 2020-12-14 NOTE — Progress Notes (Signed)
He has ILD. Several antibodies positive  - at trace are Aldolase. [place] CK, ANA 1:80. HP Panel negative. Will discuss results April 2022 visit

## 2020-12-15 ENCOUNTER — Ambulatory Visit (HOSPITAL_COMMUNITY): Payer: Medicare Other | Attending: Cardiovascular Disease

## 2020-12-15 ENCOUNTER — Other Ambulatory Visit: Payer: Self-pay

## 2020-12-15 DIAGNOSIS — I2510 Atherosclerotic heart disease of native coronary artery without angina pectoris: Secondary | ICD-10-CM | POA: Diagnosis not present

## 2020-12-15 DIAGNOSIS — R0602 Shortness of breath: Secondary | ICD-10-CM

## 2020-12-15 LAB — MYOCARDIAL PERFUSION IMAGING
LV dias vol: 117 mL (ref 62–150)
LV sys vol: 39 mL
Peak HR: 87 {beats}/min
Rest HR: 52 {beats}/min
SDS: 1
SRS: 0
SSS: 1
TID: 1.11

## 2020-12-15 MED ORDER — TECHNETIUM TC 99M TETROFOSMIN IV KIT
32.4000 | PACK | Freq: Once | INTRAVENOUS | Status: AC | PRN
  Administered 2020-12-15: 32.4 via INTRAVENOUS
  Filled 2020-12-15: qty 33

## 2020-12-15 MED ORDER — TECHNETIUM TC 99M TETROFOSMIN IV KIT
10.7000 | PACK | Freq: Once | INTRAVENOUS | Status: AC | PRN
  Administered 2020-12-15: 10.7 via INTRAVENOUS
  Filled 2020-12-15: qty 11

## 2020-12-15 MED ORDER — REGADENOSON 0.4 MG/5ML IV SOLN
0.4000 mg | Freq: Once | INTRAVENOUS | Status: AC
  Administered 2020-12-15: 0.4 mg via INTRAVENOUS

## 2020-12-28 ENCOUNTER — Ambulatory Visit: Payer: Medicare Other | Admitting: Internal Medicine

## 2021-01-01 ENCOUNTER — Other Ambulatory Visit (HOSPITAL_COMMUNITY): Payer: Medicare Other

## 2021-01-04 ENCOUNTER — Ambulatory Visit (HOSPITAL_COMMUNITY): Payer: Medicare Other | Attending: Cardiology

## 2021-01-04 ENCOUNTER — Encounter (HOSPITAL_COMMUNITY): Payer: Self-pay

## 2021-01-04 ENCOUNTER — Encounter: Payer: Self-pay | Admitting: Primary Care

## 2021-01-04 ENCOUNTER — Other Ambulatory Visit: Payer: Self-pay

## 2021-01-04 ENCOUNTER — Encounter: Payer: Self-pay | Admitting: Internal Medicine

## 2021-01-04 ENCOUNTER — Ambulatory Visit: Payer: Medicare Other | Admitting: Primary Care

## 2021-01-04 DIAGNOSIS — J449 Chronic obstructive pulmonary disease, unspecified: Secondary | ICD-10-CM | POA: Diagnosis not present

## 2021-01-04 DIAGNOSIS — J84112 Idiopathic pulmonary fibrosis: Secondary | ICD-10-CM | POA: Diagnosis not present

## 2021-01-04 MED ORDER — BUDESONIDE-FORMOTEROL FUMARATE 160-4.5 MCG/ACT IN AERO: 2.0000 | INHALATION_SPRAY | Freq: Two times a day (BID) | RESPIRATORY_TRACT | 6 refills | Status: DC

## 2021-01-04 MED ORDER — BUDESONIDE-FORMOTEROL FUMARATE 160-4.5 MCG/ACT IN AERO: 2.0000 | INHALATION_SPRAY | Freq: Two times a day (BID) | RESPIRATORY_TRACT | 3 refills | Status: DC

## 2021-01-04 NOTE — Assessment & Plan Note (Addendum)
-   Serology was negative, dx IPF. Started on Esbriet end of March 2022 d/t favorable side effect panel. Currently on 267mg  3 tabs TID. Tolerating medication well, occasional mild nausea. Lost 4 lbs but he is actively trying. Checking CMET in 1-2 weeks. Needs Spirometry with DLCO and 6MWT in June. FU in 6 weeks with Dr. Lavell Anchors- he is open to research opportunities.
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
## 2021-01-04 NOTE — Assessment & Plan Note (Addendum)
-   FEV1 3.75 (90%), ratio 79/ Mild emphysema on CT imaging - Alpha 1 phenoptype MM, 155  - Continue Symbicort 160 two puff twice daily

## 2021-01-04 NOTE — Patient Instructions (Addendum)
Recommendations: - Continue Esbriet 801mg  three times a day (take with food) - Notify office if you develop worsening nausea or significant weight loss   Orders: - Get labs drawn in 1-2 weeks (kidney and liver function) - Due for Spirometry with DLCO end of June (ordered) - 6 minute walk test (ordered)  Follow-up: - 6 weeks with Dr. Chase Caller ([facility] clinic)/ with 6 minute walk test    Pulmonary Fibrosis  Pulmonary fibrosis is a type of lung disease that causes scarring. Over time, the scar tissue builds up in the air sacs of your lungs (alveoli). This makes it hard for you to breathe. Less oxygen can get into your blood. Scarring from pulmonary fibrosis gets worse over time. This damage is permanent and may lead to other serious health problems. What are the causes? There are many different causes of pulmonary fibrosis. Sometimes the cause is not known. This is called idiopathic pulmonary fibrosis. Other causes include:  Exposure to chemicals and substances found in agricultural, farm, Architect, or factory work. These include mold, asbestos, silica, metal dusts, and toxic fumes.  Sarcoidosis. In this disease, areas of inflammatory cells (granulomas) form and most often affect the lungs.  Autoimmune diseases. These include diseases such as rheumatoid arthritis, systemic sclerosis, or connective tissue disease.  Taking certain medicines. These include drugs used in radiation therapy or used to treat seizures, heart problems, and some infections. What increases the risk? You are more likely to develop this condition if:  You have a family history of the disease.  You are older. The condition is more common in older adults.  You have a history of smoking.  You have a job that exposes you to certain chemicals.  You have gastroesophageal reflux disease (GERD). What are the signs or symptoms? Symptoms of this condition include:  Difficulty breathing that gets worse with  activity.  Shortness of breath (dyspnea).  Dry, hacking cough.  Rapid, shallow breathing during exercise or while at rest.  Bluish skin and lips.  Loss of appetite.  Weakness.  Weight loss and fatigue.  Rounded and enlarged fingertips (clubbing). How is this diagnosed? This condition may be diagnosed based on:  Your symptoms and medical history.  A physical exam. You may also have tests, including:  A test that involves looking inside your lungs with an instrument (bronchoscopy).  Imaging studies of your lungs and heart.  Tests to measure how well you are breathing (pulmonary function tests).  Blood tests.  Tests to see how well your lungs work while you are walking (pulmonary stress test).  A procedure to remove a lung tissue sample to look at it under a microscope (biopsy). How is this treated? There is no cure for pulmonary fibrosis. Treatment focuses on managing symptoms and preventing scarring from getting worse. This may include:  Medicines, such as: ? Steroids to prevent permanent lung changes. ? Medicines to suppress your body's defense system (immune system). ? Medicines to help with lung function by reducing inflammation or scarring.  Ongoing monitoring with X-rays and lab work.  Oxygen therapy.  Pulmonary rehabilitation.  Surgery. In some cases, a lung transplant is possible. Follow these instructions at home: Medicines  Take over-the-counter and prescription medicines only as told by your health care provider.  Keep your vaccinations up to date as recommended by your health care provider. General instructions  Do not use any products that contain nicotine or tobacco, such as cigarettes and e-cigarettes. If you need help quitting, ask your health care  provider.  Get regular exercise, but do not overexert yourself. Ask your health care provider to suggest some activities that are safe for you to do. ? If you have physical limitations, you may  get exercise by walking, using a stationary bike, or doing chair exercises. ? Ask your health care provider about using oxygen while exercising.  If you are exposed to chemicals and substances at work, make sure that you wear a mask or respirator at all times.  Join a pulmonary rehabilitation program or a support group for people with pulmonary fibrosis.  Eat small meals often so you do not get too full. Overeating can make breathing trouble worse.  Maintain a healthy weight. Lose weight if you need to.  Do breathing exercises as directed by your health care provider.  Keep all follow-up visits as told by your health care provider. This is important.      Contact a health care provider if you:  Have symptoms that do not get better with medicines.  Are not able to be as active as usual.  Have trouble taking a deep breath.  Have a fever or chills.  Have blue lips or skin.  Have clubbing of your fingers. Get help right away if you:  Have a sudden worsening of your symptoms.  Have chest pain.  Cough up mucus that is dark in color.  Have a lot of headaches.  Get very confused or sleepy. Summary  Pulmonary fibrosis is a type of lung disease that causes scar tissue to build up in the air sacs of your lungs (alveoli) over time. Less oxygen can get into your blood. This makes it hard for you to breathe.  Scarring from pulmonary fibrosis gets worse over time. This damage is permanent and may lead to other serious health problems.  You are more likely to develop this condition if you have a family history of the condition or a job that exposes you to certain chemicals.  There is no cure for pulmonary fibrosis. Treatment focuses on managing symptoms and preventing scarring from getting worse. This information is not intended to replace advice given to you by your health care provider. Make sure you discuss any questions you have with your health care provider. Document Revised:  10/11/2017 Document Reviewed: 10/11/2017 Elsevier Patient Education  2021 [employer].   Preventing Pulmonary Fibrosis Pulmonary fibrosis is scarring, stiffening, and thickening (fibrosis) that develops over time in lung tissue (pulmonary tissue). At first, this may cause shortness of breath and a dry cough. Over time, symptoms often become more serious and complications may occur. There are many different causes of pulmonary fibrosis. Sometimes the cause is not known. You may not be able to entirely prevent pulmonary fibrosis, but you may be able to reduce your risk by:  Avoiding pollutants.  Protecting your lungs.  Making lifestyle changes.  Managing conditions that may lead to pulmonary fibrosis. How can pulmonary fibrosis affect me? Pulmonary fibrosis is a serious condition. Once you have this condition, it tends to get worse (progress) gradually over several years and may lead to:  Collapsed lung.  Infection of your lungs (pneumonia).  Blood clots in the lungs.  Lung cancer.  Inability of your heart to pump blood (heart failure).  A condition in which the lungs cannot take in enough oxygen or remove excess carbon dioxide (respiratory failure). What actions can I take to prevent this condition? Avoid pollutants Protect yourself from breathing harmful dusts. Harmful dusts include grain, wood, soil, coal,  silica, and asbestos. To protect yourself from these dusts:  Know which types of dust you may be exposed to.  Avoid working in dusty areas when possible.  Work in an area with good air circulation (ventilation).  Wet down work areas to reduce dust when possible.  Use an approved respirator mask.  Do not eat, drink, or take breaks in dusty areas.  Wash your hands and face or shower after work.  Change into clean clothes before going home.  Do not park your car near areas that may be affected by dust. Protect your lungs Take steps to protect yourself from lung  infections:  Stay up to date on all vaccines. This includes the flu (influenza) and pneumonia (pneumococcal) vaccines.  Avoid contact with people who are sick with flu or cold symptoms.  Wash your hands often.  Use a disinfectant to regularly clean surfaces at home and at work.  Get 7-8 hours of sleep every night.  Eat a healthy diet that includes plenty of fruits and vegetables, whole grains, and healthy sources of protein, such as poultry, fish, nuts, and low-fat dairy.  Exercise at least 30 minutes most days of the week.   Make lifestyle changes  Do not use any products that contain nicotine or tobacco, such as cigarettes and e-cigarettes. If you need help quitting, ask your health care provider.  Avoid secondhand smoke.   General information  If you have heartburn, or GERD, work with your health care provider to treat your condition. This may include taking medicines and making changes to your diet.  Ask your health care provider whether your medicines put you at risk for pulmonary fibrosis. This is especially important if you have seizures, heart disease, or cancer. Talk with your health care provider about other treatments that may work for you. Where to find more information Learn more about pulmonary fibrosis from:  American Lung Association: lung.org  Pulmonary Fibrosis Foundation: pulmonaryfibrosis.org Summary  You may be able to lower your risk for pulmonary fibrosis by avoiding pollutants, protecting your lungs, making lifestyle changes, and managing conditions that may lead to pulmonary fibrosis.  If you work or spend time around harmful dusts, take precautions to avoid inhaling dust.  Protecting your lungs from infections can help you stay healthy and may reduce your risk of pulmonary fibrosis.  Quitting smoking can help reduce the risk of pulmonary fibrosis and other serious lung conditions. This information is not intended to replace advice given to you by your  health care provider. Make sure you discuss any questions you have with your health care provider. Document Revised: 04/11/2019 Document Reviewed: 11/27/2017 Elsevier Patient Education  [place].

## 2021-01-05 ENCOUNTER — Ambulatory Visit (HOSPITAL_COMMUNITY): Payer: Medicare Other | Attending: Cardiology

## 2021-01-05 DIAGNOSIS — R0602 Shortness of breath: Secondary | ICD-10-CM | POA: Diagnosis not present

## 2021-01-05 DIAGNOSIS — I2510 Atherosclerotic heart disease of native coronary artery without angina pectoris: Secondary | ICD-10-CM | POA: Diagnosis not present

## 2021-01-05 LAB — ECHOCARDIOGRAM COMPLETE
Area-P 1/2: 3.72 cm2
S' Lateral: 3.2 cm

## 2021-01-06 ENCOUNTER — Other Ambulatory Visit (HOSPITAL_COMMUNITY): Payer: Self-pay

## 2021-01-06 ENCOUNTER — Other Ambulatory Visit: Payer: Self-pay | Admitting: Internal Medicine

## 2021-01-06 DIAGNOSIS — J84112 Idiopathic pulmonary fibrosis: Secondary | ICD-10-CM

## 2021-01-06 MED ORDER — ESBRIET 267 MG PO TABS
801.0000 mg | ORAL_TABLET | Freq: Three times a day (TID) | ORAL | 11 refills | Status: DC
  Filled 2021-01-06: qty 270, 30d supply, fill #0
  Filled 2021-02-15: qty 270, 30d supply, fill #1
  Filled 2021-03-15: qty 270, 30d supply, fill #2

## 2021-01-07 ENCOUNTER — Other Ambulatory Visit (HOSPITAL_COMMUNITY): Payer: Self-pay

## 2021-01-13 ENCOUNTER — Other Ambulatory Visit (HOSPITAL_COMMUNITY): Payer: Self-pay

## 2021-01-15 ENCOUNTER — Other Ambulatory Visit: Payer: Medicare Other | Admitting: *Deleted

## 2021-01-15 ENCOUNTER — Other Ambulatory Visit: Payer: Self-pay

## 2021-01-15 DIAGNOSIS — E785 Hyperlipidemia, unspecified: Secondary | ICD-10-CM

## 2021-01-15 LAB — LIPID PANEL
Chol/HDL Ratio: 2.8 ratio (ref 0.0–5.0)
Cholesterol, Total: 116 mg/dL (ref 100–199)
HDL: 41 mg/dL (ref >39)
LDL Chol Calc (NIH): 60 mg/dL (ref 0–99)
Triglycerides: 72 mg/dL (ref 0–149)
VLDL Cholesterol Cal: 15 mg/dL (ref 5–40)
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
## 2021-01-27 ENCOUNTER — Other Ambulatory Visit: Payer: Self-pay

## 2021-01-27 ENCOUNTER — Encounter: Payer: Self-pay | Admitting: Cardiology

## 2021-01-27 ENCOUNTER — Ambulatory Visit: Payer: Medicare Other | Admitting: Cardiology

## 2021-01-27 VITALS — BP 122/76 | HR 60 | Ht 73.5 in | Wt 229.8 lb

## 2021-01-27 DIAGNOSIS — R0602 Shortness of breath: Secondary | ICD-10-CM | POA: Diagnosis not present

## 2021-01-27 DIAGNOSIS — I2510 Atherosclerotic heart disease of native coronary artery without angina pectoris: Secondary | ICD-10-CM | POA: Diagnosis not present

## 2021-01-27 DIAGNOSIS — J849 Interstitial pulmonary disease, unspecified: Secondary | ICD-10-CM

## 2021-01-27 DIAGNOSIS — E785 Hyperlipidemia, unspecified: Secondary | ICD-10-CM | POA: Diagnosis not present

## 2021-01-27 NOTE — Patient Instructions (Signed)

## 2021-02-05 ENCOUNTER — Other Ambulatory Visit (HOSPITAL_COMMUNITY): Payer: Self-pay

## 2021-02-08 ENCOUNTER — Encounter: Payer: Self-pay | Admitting: Internal Medicine

## 2021-02-08 ENCOUNTER — Other Ambulatory Visit: Payer: Self-pay

## 2021-02-08 ENCOUNTER — Ambulatory Visit: Payer: Medicare Other | Admitting: Internal Medicine

## 2021-02-08 VITALS — BP 144/84 | HR 73 | Temp 97.9°F | Ht 73.5 in | Wt 225.0 lb

## 2021-02-08 DIAGNOSIS — J439 Emphysema, unspecified: Secondary | ICD-10-CM | POA: Diagnosis not present

## 2021-02-08 DIAGNOSIS — J84112 Idiopathic pulmonary fibrosis: Secondary | ICD-10-CM | POA: Diagnosis not present

## 2021-02-08 DIAGNOSIS — Z5181 Encounter for therapeutic drug level monitoring: Secondary | ICD-10-CM | POA: Diagnosis not present

## 2021-02-08 LAB — HEPATIC FUNCTION PANEL
ALT: 30 U/L (ref 0–53)
AST: 20 U/L (ref 0–37)
Albumin: 4.1 g/dL (ref 3.5–5.2)
Alkaline Phosphatase: 61 U/L (ref 39–117)
Bilirubin, Direct: 0.1 mg/dL (ref 0.0–0.3)
Total Bilirubin: 0.5 mg/dL (ref 0.2–1.2)
Total Protein: 6.7 g/dL (ref 6.0–8.3)

## 2021-02-08 NOTE — Patient Instructions (Addendum)
ICD-10-CM   1. IPF (idiopathic pulmonary fibrosis) (HCC)  J84.112 Hepatic function panel    Pulmonary function test    Hepatic function panel    #Pulmnary fibrosis - IPF  -clinically stable - handling esbriet well but too sun exposed without sunscreen - last PFT Feb 2021  Plan  - continue esbriet - do LFT 02/08/2021 and again in 1 month - other steps in mamgment - weight loss, rehab, trials, support group, transplant - to be discussed later - do spirometyr and dlco in 3 months    EMphysema - alpha 1 MM   -- stable disease  Plan - continue symbicort scheduled with albuterol as neede     Followup  - 12 weeks with Dr Chase Caller - 30 min slot

## 2021-02-08 NOTE — Progress Notes (Signed)
OV 11/12/2020  Subjective:  Patient ID: Erik Strong, male , DOB: [birth year] , age 60 y.o. , MRN: [id number] , ADDRESS: [place] [address] PCP Alroy Dust, L.[name], MD Patient Care Team: Alroy Dust, Carlean Jews.[name], MD as PCP - General (Family Medicine)  This Provider for this visit: Treatment Team:  Attending Provider: Brand Males, MD    11/12/2020 -   Chief Complaint  Patient presents with  . New Patient (Initial Visit)    Doing ok, winded at times     HPI Erik Strong 60 y.o. -referred by Dr. Christinia Gully to the ILD center because of discovery of pulmonary fibrosis he also has associated emphysema.  He says he has been disabled because of back issues.  He says he has severe ADD and sometimes he will have to search for key for 2 hours.  He says he can get overwhelmed easily and it might take multiple visits for him to fully grasp disease discussion and therapeutic management plan and prognosis.  [facility] Comprehensive ILD Questionnaire  Symptoms: Insidious onset of shortness of breath gradually getting worse for the last several years.  Episodic dyspnea present severity scale below.  He does have difficulty keeping up with others of his age.  He does have some arthralgia.  He does have cough he coughs at night he brings up some phlegm.  It is whitish-green.  He does clear his throat he does feel this a tickle in the back of the throat occasional wheezing.  No nausea no vomiting no diarrhea.    Past Medical History : As below but no collagen vascular disease or vasculitis.  Does have chronic kidney disease no history of blood clots   ROS:   Does have fatigue arthralgia he does have cracked skin in his fingers.  Does have arthralgia.  Also has daytime somnolence.  He is not able to use his CPAP no recurrent fever no weight loss.  Does have  FAMILY HISTORY of LUNG DISEASE:   Denies   EXPOSURE HISTORY: Between 1978 and 2018 he smoked 30  cigarettes/day and then quit.  Does not smoke cigars no smoking pipes no vaping.  Current marijuana user.  In 2003 use some cocaine.  HOME and HOBBY DETAILS : [place]  He is lived in the home for 50 years.  The home itself is 06/06/1961 built and is 59 years old.  There is dampness in the basement he does do some occasional yard work and Optician, dispensing but otherwise no exposure to mold organic antigens he does have cats and dogs   OCCUPATIONAL HISTORY (122 questions) : He has done work with damp air-conditioned spaces.  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  Done pest control work.  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  Done carpentry done wood trimming.  Done oil heating done asbestos work done [employer] work.  Done epoxy reasons.  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  Done plastic work.  Done insulation work.  Done metal grinding done machine operating done industrial work.  Done lack ring done spray painting.  Done waterproofing done 5 working.  Done flooring.  Done metal legs.  Done Nurse, learning disability.  Also worked in [place] (27 items): denies  CT Chest high Resolution 09/25/2020 -personally visualized and agree with the findings of classic UIP associated with some emphysema.  Narrative & Impression  CLINICAL DATA:  60 year old male with history of asbestos exposure. Intermittent wheezing. Former smoker (quit 4 years ago). Suspected interstitial lung disease.  EXAM: CT CHEST WITHOUT CONTRAST  TECHNIQUE: Multidetector CT imaging of the chest was performed following the standard protocol without intravenous contrast. High resolution imaging of the lungs, as well as inspiratory and expiratory imaging, was performed.  COMPARISON:  No  priors.  FINDINGS: Cardiovascular: Heart size is normal. There is no significant pericardial fluid, thickening or pericardial calcification. There is aortic atherosclerosis, as well as atherosclerosis of the great vessels of the mediastinum and the coronary arteries, including calcified atherosclerotic plaque in the left anterior descending and right coronary arteries.  Mediastinum/Nodes: No pathologically enlarged mediastinal or hilar lymph nodes. Please note that accurate exclusion of hilar adenopathy is limited on noncontrast CT scans. Esophagus is unremarkable in appearance. No axillary lymphadenopathy.  Lungs/Pleura: High-resolution images demonstrate patchy regions of ground-glass attenuation, septal thickening, subpleural reticulation, traction bronchiectasis and frank honeycombing. Findings have a craniocaudal gradient. Inspiratory and expiratory imaging is unremarkable. No acute consolidative airspace disease. No pleural effusions. No definite suspicious appearing pulmonary nodules or masses are noted. Mild diffuse bronchial wall thickening with mild centrilobular and paraseptal emphysema.  Upper Abdomen: Low-attenuation adrenal nodules bilaterally measuring 2.9 x 1.9 cm on the right (6 HU) and 2.0 x 1.6 cm on the left (3 HU), compatible with benign adrenal adenomas. Aortic atherosclerosis.  Musculoskeletal: There are no aggressive appearing lytic or blastic lesions noted in the visualized portions of the skeleton.  IMPRESSION: 1. 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 2. There is also mild diffuse bronchial wall thickening with mild centrilobular and paraseptal emphysema; imaging findings suggestive of underlying COPD. 3. Aortic atherosclerosis, in addition to left anterior descending and right coronary artery disease.  Please note that although the presence of coronary artery calcium documents the presence of coronary artery disease, the severity of this disease and any potential stenosis cannot be assessed on this non-gated CT examination. Assessment for potential risk factor modification, dietary therapy or pharmacologic therapy may be warranted, if clinically indicated. 4. Bilateral adrenal adenomas, as above.  Aortic Atherosclerosis (ICD10-I70.0) and Emphysema (ICD10-J43.9).   Electronically Signed   By: Vinnie Langton M.D.   On: 09/25/2020 14:33      PFT  PFT Results Latest Ref Rng & Units 10/21/2019  FVC-Pre L 4.59  FVC-Predicted Pre % 84  FVC-Post L 4.72  FVC-Predicted Post % 86  Pre FEV1/FVC % % 82  Post FEV1/FCV % % 79  FEV1-Pre L 3.76  FEV1-Predicted Pre % 90  FEV1-Post L 3.75  DLCO uncorrected ml/min/mmHg 27.70  DLCO UNC% % 88  DLCO corrected ml/min/mmHg 27.11  DLCO COR %Predicted % 86  DLVA Predicted % 91  TLC L 7.98  TLC % Predicted % 103  RV % Predicted % 140    Results for JAWAUN, SWAN (MRN [id number]) as of 11/12/2020 11:00  Ref. Range 09/07/2020 23:06  Creatinine Latest Ref Range: 0.61 - 1.24 mg/dL 1.63 (H)  Results for COLWYN, BAGGARLY (MRN [id number]) as of 11/12/2020 11:00  Ref. Range 09/07/2020 [id number]  BASIC METABOLIC PANEL Unknown Rpt (A)  Sodium Latest Ref Range: 135 - 145 mmol/L 139  Potassium Latest Ref Range: 3.5 - 5.1  mmol/L 4.1  Chloride Latest Ref Range: 98 - 111 mmol/L 100  CO2 Latest Ref Range: 22 - 32 mmol/L 29  Glucose Latest Ref Range: 70 - 99 mg/dL 104 (H)      Creatinine Latest Ref Range: 0.61 - 1.24 mg/dL 1.63 (H)  Calcium Latest Ref Range: 8.9 - 10.3 mg/dL 9.3  Anion gap Latest Ref Range: 5 - 15  10  GFR, Estimated Latest Ref Range: >60 mL/min 49 (L)  Results for TAJIDDIN, SCHIELKE (MRN [id number]) as of 11/12/2020 11:00  Ref. Range 09/07/2020 23:06  Hemoglobin Latest Ref Range: 13.0 - 17.0 g/dL 16.6    OV 02/08/2021  Subjective:  Patient  ID: Erik Strong, male , DOB: [birth year] , age 62 y.o. , MRN: [id number] , ADDRESS: [place] [address] PCP Alroy Dust, L.[name], MD Patient Care Team: Alroy Dust, Carlean Jews.[name], MD as PCP - General (Family Medicine)  This Provider for this visit: Treatment Team:  Attending Provider: Brand Males, MD    02/08/2021 -   Chief Complaint  Patient presents with  . Follow-up    Pt states he has been doing okay since last visit. States he still becomes SOB with exertion.   Follow-up idiopathic pulmonary fibrosis `- Diagnoses given 11/13/2020 [myositis panel negative but CK and aldolase slightly elevated]  -Clinical diagnosis based on male gender, classic UIP and serology only being trace positive although age only 40 and prior smoking history  - Last PFT February 2021 -Last high-resolution CT January 2022 -Started pirfenidone end of March 2022   Associated emphysema alpha-1 MM  -On Symbicort  Associated significant attention deficit disorder  Normal cardiac stress test March 2022  HPI Erik Strong 60 y.o. -returns for follow-up.  He is now on pirfenidone since end of March 2022.  He saying he is tolerating it fine except for occasional nausea that is very mild.  There are no real problems.  Overall he is feeling good.  Today he is wearing sleeveless T-shirt and shorts.  He has a history of skin cancer.  He says he is not putting or applying sunscreen although he notes that he needs to.  He is now on pirfenidone as well.  I cautioned him about this.  His last liver function test was in February 2022.  He will have his liver function test today and he is agreed.  His symptom score and walking desaturation test appears stable.  His last pulmonary function test was over a year ago and he is willing to have this scheduled prior to the next visit.    SYMPTOM SCALE - ILD 11/12/2020  02/08/2021 Now on esbriet 225#  O2 use ra ts  Shortness of Breath 0 -> 5 scale with 5 being worst  (score 6 If unable to do)   At rest 1 0/5  Simple tasks - showers, clothes change, eating, shaving 2 1  Household (dishes, doing bed, laundry) 2 2  Shopping 3 3  Walking level at own pace 1 2  Walking up Stairs 3 3  Total (30-36) Dyspnea Score 12 11.5  How bad is your cough? 2 1.5  How bad is your fatigue 2 3  How bad is nausea 0 0.5  How bad is vomiting?  0 0  How bad is diarrhea? 0 0  How bad is anxiety? 2 1  How bad is depression 1 0  6    Simple office walk 185 feet x  3 laps goal with forehead probe 11/12/2020  /  yf   O2 used ra ra  Number laps completed 3 3  Comments about pace Slow pace Mod pae  Resting Pulse Ox/HR 96% and 71/min 100% and 73  Final Pulse Ox/HR 95% and 85/min 97% and 93/,o  Desaturated </= 88% no no  Desaturated <= 3% points no Yes, 3  Got Tachycardic >/= 90/min no Myes  Symptoms at end of test Some dsypnea last lap Mild-mod dyspnea  Miscellaneous comments x x     CT Chest data  No results found.    PFT  PFT Results Latest Ref Rng & Units 10/21/2019  FVC-Pre L 4.59  FVC-Predicted Pre % 84  FVC-Post L 4.72  FVC-Predicted Post % 86  Pre FEV1/FVC % % 82  Post FEV1/FCV % % 79  FEV1-Pre L 3.76  FEV1-Predicted Pre % 90  FEV1-Post L 3.75  DLCO uncorrected ml/min/mmHg 27.70  DLCO UNC% % 88  DLCO corrected ml/min/mmHg 27.11  DLCO COR %Predicted % 86  DLVA Predicted % 91  TLC L 7.98  TLC % Predicted % 103  RV % Predicted % 140       has a past medical history of ADD (attention deficit disorder), Anxiety, Arthritis, Chronic pain, History of kidney stones, and Temporal head injury.   reports that he quit smoking about 3 years ago. His smoking use included cigarettes. He has a 84.00 pack-year smoking history. He has never used smokeless tobacco.  Past Surgical History:  Procedure Laterality Date  . HIP SURGERY    . I & D EXTREMITY Left 09/02/2019   Procedure: IRRIGATION AND DEBRIDEMENT HAND;  Surgeon: Milly Jakob, MD;  Location: [facility];  Service: Orthopedics;  Laterality: Left;  . TOOTH EXTRACTION Bilateral 12/30/2016   Procedure: EXTRACTION MOLARS;  Surgeon: Diona Browner, DDS;  Location: [facility];  Service: Oral Surgery;  Laterality: Bilateral;  Extraction of teeth four, five, twenty-six, thirty; removal of bilateral mandibular lingual tori    Allergies  Allergen Reactions  . Other Other (See Comments)  . Codeine Nausea And Vomiting  . Macrodantin [Nitrofurantoin Macrocrystal] Rash  . Shellfish Allergy Nausea And Vomiting    Immunization History  Administered Date(s) Administered  . Influenza Split 05/10/2018, 05/04/2019, 09/08/2020  . Influenza,inj,Quad PF,6+ Mos 09/09/2016, 06/15/2017, 05/10/2018, 05/04/2019  . PFIZER(Purple Top)SARS-COV-2 Vaccination 12/16/2019, 01/08/2020, 09/08/2020  . Td 07/10/2006  . Tdap 05/14/2012, 12/03/2014  . Zoster 05/08/2019, 05/20/2020  . Zoster Recombinat (Shingrix) 05/08/2019    Family History  Problem Relation Age of Onset  . Prostate cancer Father   . CAD Maternal Uncle      Current Outpatient Medications:  .  acetaminophen (TYLENOL) 325 MG tablet, Take 2 tablets (650 mg total) by mouth every 6 (six) hours., Disp: , Rfl:  .  albuterol (VENTOLIN HFA) 108 (90 Base) MCG/ACT inhaler, Inhale 2 puffs into the lungs every 6 (six) hours as needed for wheezing or shortness of breath., Disp: 1 each, Rfl: 3 .  ALPRAZolam (XANAX) 0.5 MG tablet, Take 0.5 mg by mouth at bedtime., Disp: , Rfl:  .  amphetamine-dextroamphetamine (ADDERALL XR) 30 MG 24 hr capsule, TAKE 1 CAPSULE BY MOUTH ONCE A DAY IN THE MORNING 30 DAYS, Disp: 30 capsule, Rfl: 0 .  amphetamine-dextroamphetamine (ADDERALL) 10 MG tablet, Take 10 mg by mouth daily at 2 PM. , Disp: , Rfl:  .  aspirin EC 81 MG tablet, Take 1 tablet (81 mg total) by mouth daily. Swallow whole., Disp: 90 tablet, Rfl: 3 .  budesonide-formoterol (SYMBICORT) 160-4.5 MCG/ACT inhaler,  Inhale 2 puffs into the lungs 2 (two) times daily., Disp: 3 each,  Rfl: 3 .  levothyroxine (SYNTHROID) 50 MCG tablet, Take 50 mcg by mouth daily before breakfast., Disp: , Rfl:  .  phenylephrine-shark liver oil-mineral oil-petrolatum (PREPARATION H) 0.25-3-14-71.9 % rectal ointment, Place 1 application rectally daily as needed for hemorrhoids., Disp: , Rfl:  .  Pirfenidone (ESBRIET) 267 MG TABS, Take 3 tablets (801 mg total) by mouth in the morning, at noon, and at bedtime., Disp: 270 tablet, Rfl: 11 .  rosuvastatin (CRESTOR) 20 MG tablet, Take 1 tablet (20 mg total) by mouth daily., Disp: 90 tablet, Rfl: 3 .  sildenafil (REVATIO) 20 MG tablet, Take 20 mg by mouth daily as needed (FOR ED). , Disp: , Rfl:  .  tiZANidine (ZANAFLEX) 2 MG tablet, Take 2 mg by mouth at bedtime., Disp: , Rfl:  .  traMADol (ULTRAM) 50 MG tablet, Take 50 mg by mouth See admin instructions. Take one tablet (50 mg) by mouth daily at bedtime, may also take one tablet (50 mg) twice daily as needed for pain, Disp: , Rfl:       Objective:   Vitals:   02/08/21 1058  BP: (!) 144/84  Pulse: 73  Temp: 97.9 F (36.6 C)  TempSrc: Temporal  SpO2: 98%  Weight: 225 lb (102.1 kg)  Height: 6' 1.5" (1.867 m)    Estimated body mass index is 29.28 kg/m as calculated from the following:   Height as of this encounter: 6' 1.5" (1.867 m).   Weight as of this encounter: 225 lb (102.1 kg).  @WEIGHTCHANGE @  [employer]   02/08/21 1058  Weight: 225 lb (102.1 kg)     Physical Exam   General: No distress. Looks well.  Sleeveless T-shirt and shorts Neuro: Alert and Oriented x 3. GCS 15. Speech normal Psych: Pleasant Resp:  Barrel Chest - no.  Wheeze - no, Crackles -yes Velcro crackles of the lung base, No overt respiratory distress CVS: Normal heart sounds. Murmurs - no Ext: Stigmata of Connective Tissue Disease - no HEENT: Normal upper airway. PEERL +. No post nasal drip        Assessment:       ICD-10-CM   1. IPF (idiopathic pulmonary fibrosis) (HCC)  J84.112 Hepatic function  panel    Pulmonary function test    Hepatic function panel  2. Pulmonary emphysema, unspecified emphysema type ([facility])  J43.9   3. Encounter for therapeutic drug monitoring  Z51.81        Plan:     Patient Instructions     ICD-10-CM   1. IPF (idiopathic pulmonary fibrosis) (HCC)  J84.112 Hepatic function panel    Pulmonary function test    Hepatic function panel    #Pulmnary fibrosis - IPF  -clinically stable - handling esbriet well but too sun exposed without sunscreen - last PFT Feb 2021  Plan  - continue esbriet - do LFT 02/08/2021 and again in 1 month - other steps in mamgment - weight loss, rehab, trials, support group, transplant - to be discussed later - do spirometyr and dlco in 3 months    EMphysema - alpha 1 MM   -- stable disease  Plan - continue symbicort scheduled with albuterol as neede     Followup  - 12 weeks with Dr Chase Caller - 39 min slot     SIGNATURE    Dr. Brand Males, M.D., F.C.C.P,  Pulmonary and Critical Care Medicine Staff Physician, [facility] Director -  Interstitial Lung Disease  Program  Pulmonary [place] at [place], [place], [address]  Pager: [phone], If no answer or between  15:00h - 7:00h: call [phone] Telephone: [phone]  11:43 AM 02/08/2021

## 2021-02-16 ENCOUNTER — Other Ambulatory Visit (HOSPITAL_COMMUNITY): Payer: Self-pay

## 2021-03-01 ENCOUNTER — Other Ambulatory Visit (HOSPITAL_COMMUNITY): Payer: Self-pay

## 2021-03-11 ENCOUNTER — Other Ambulatory Visit (HOSPITAL_COMMUNITY): Payer: Self-pay

## 2021-03-15 ENCOUNTER — Other Ambulatory Visit (HOSPITAL_COMMUNITY): Payer: Self-pay

## 2021-03-16 ENCOUNTER — Other Ambulatory Visit (INDEPENDENT_AMBULATORY_CARE_PROVIDER_SITE_OTHER): Payer: Medicare Other

## 2021-03-16 DIAGNOSIS — Z5181 Encounter for therapeutic drug level monitoring: Secondary | ICD-10-CM | POA: Diagnosis not present

## 2021-03-16 LAB — COMPREHENSIVE METABOLIC PANEL
ALT: 42 U/L (ref 0–53)
AST: 25 U/L (ref 0–37)
Albumin: 4.4 g/dL (ref 3.5–5.2)
Alkaline Phosphatase: 66 U/L (ref 39–117)
BUN: 16 mg/dL (ref 6–23)
CO2: 28 mEq/L (ref 19–32)
Calcium: 9.5 mg/dL (ref 8.4–10.5)
Chloride: 102 mEq/L (ref 96–112)
Creatinine, Ser: 1.22 mg/dL (ref 0.40–1.50)
GFR: 65.26 mL/min (ref >60.00)
Glucose, Bld: 102 mg/dL — ABNORMAL HIGH (ref 70–99)
Potassium: 4.3 mEq/L (ref 3.5–5.1)
Sodium: 137 mEq/L (ref 135–145)
Total Bilirubin: 0.4 mg/dL (ref 0.2–1.2)
Total Protein: 7.4 g/dL (ref 6.0–8.3)

## 2021-03-31 ENCOUNTER — Other Ambulatory Visit: Payer: Self-pay | Admitting: Internal Medicine

## 2021-04-08 ENCOUNTER — Other Ambulatory Visit (HOSPITAL_COMMUNITY): Payer: Self-pay

## 2021-04-08 ENCOUNTER — Other Ambulatory Visit: Payer: Self-pay | Admitting: Internal Medicine

## 2021-04-08 DIAGNOSIS — J84112 Idiopathic pulmonary fibrosis: Secondary | ICD-10-CM

## 2021-04-08 MED ORDER — PIRFENIDONE 801 MG PO TABS
801.0000 mg | ORAL_TABLET | Freq: Three times a day (TID) | ORAL | 1 refills | Status: DC
  Filled 2021-04-08: qty 270, fill #0
  Filled 2021-04-12: qty 90, 30d supply, fill #0
  Filled 2021-05-18: qty 90, 30d supply, fill #1
  Filled 2021-06-08: qty 90, 30d supply, fill #2
  Filled 2021-07-13: qty 90, 30d supply, fill #3
  Filled 2021-08-10: qty 90, 30d supply, fill #4
  Filled 2021-09-07: qty 90, 30d supply, fill #5

## 2021-04-08 NOTE — Telephone Encounter (Signed)
Refill for Esbriet sent to [facility]. Changed to 801mg  tab three times daily to reduce pill burden. Patient is due for hepatic function lab on 04/15/21. Will send MyChart message to patient to advise that he may have CMP drawn after his appt with Dr. Melvyn Novas on 04/20/21  Knox Saliva, PharmD, MPH, BCPS Clinical Pharmacist (Rheumatology and Pulmonology)

## 2021-04-12 ENCOUNTER — Other Ambulatory Visit (HOSPITAL_COMMUNITY): Payer: Self-pay

## 2021-04-13 ENCOUNTER — Other Ambulatory Visit (HOSPITAL_COMMUNITY): Payer: Self-pay

## 2021-04-16 NOTE — Addendum Note (Signed)
Addended by: Cassandria Anger on: 04/16/2021 02:01 PM   Modules accepted: Orders

## 2021-04-20 ENCOUNTER — Other Ambulatory Visit: Payer: Self-pay

## 2021-04-20 ENCOUNTER — Ambulatory Visit (INDEPENDENT_AMBULATORY_CARE_PROVIDER_SITE_OTHER): Payer: Medicare Other | Admitting: Internal Medicine

## 2021-04-20 ENCOUNTER — Encounter: Payer: Self-pay | Admitting: Internal Medicine

## 2021-04-20 DIAGNOSIS — J449 Chronic obstructive pulmonary disease, unspecified: Secondary | ICD-10-CM

## 2021-04-20 DIAGNOSIS — Z5181 Encounter for therapeutic drug level monitoring: Secondary | ICD-10-CM | POA: Diagnosis not present

## 2021-04-20 DIAGNOSIS — J84112 Idiopathic pulmonary fibrosis: Secondary | ICD-10-CM | POA: Diagnosis not present

## 2021-04-20 LAB — COMPREHENSIVE METABOLIC PANEL
ALT: 39 U/L (ref 0–53)
AST: 21 U/L (ref 0–37)
Albumin: 4 g/dL (ref 3.5–5.2)
Alkaline Phosphatase: 60 U/L (ref 39–117)
BUN: 13 mg/dL (ref 6–23)
CO2: 28 mEq/L (ref 19–32)
Calcium: 9.2 mg/dL (ref 8.4–10.5)
Chloride: 103 mEq/L (ref 96–112)
Creatinine, Ser: 1.4 mg/dL (ref 0.40–1.50)
GFR: 55.29 mL/min — ABNORMAL LOW (ref >60.00)
Glucose, Bld: 104 mg/dL — ABNORMAL HIGH (ref 70–99)
Potassium: 4 mEq/L (ref 3.5–5.1)
Sodium: 139 mEq/L (ref 135–145)
Total Bilirubin: 0.4 mg/dL (ref 0.2–1.2)
Total Protein: 7.1 g/dL (ref 6.0–8.3)

## 2021-04-20 NOTE — Assessment & Plan Note (Signed)
Quit smoking 2018 then 25lb wt gain as of 1st pulm ov 05/09/2019  - 05/09/2019   Walked RA  2 laps @  approx 27f each @ fast pace  stopped due to  End of study, no sob, sats 96%  - 05/09/2019  After extensive coaching inhaler device,  effectiveness =    90% try symb 160 2bid ? ? Benefit but only using a few times a week - PFT's  10/21/2019  FEV1 3.75 (90 % ) ratio 0.79  p 0 % improvement from saba p 0 prior to study with DLCO  27.70 (88%) corrects to 3.89 (91%)  for alv volume and FV curve nl   > rec symb 160 15 min before heavy activity on alternating days and d/c if no benefit   Difficult to tease out the doe from PF from AB x that the AB should improve with regular use of symbicort 160 2bid if he can afford it  - if not, at least he should do symbicort 160 x 2 pffs at least 15 min before starting daily activity.  - The proper method of use, as well as anticipated side effects, of a metered-dose inhaler were discussed and demonstrated to the patient using teach back method.    Re saba: I spent extra time with pt today reviewing appropriate use of albuterol for prn use on exertion with the following points: 1) saba is for relief of sob that does not improve by walking a slower pace or resting but rather if the pt does not improve after trying this first. 2) If the pt is convinced, as many are, that saba helps recover from activity faster then it's easy to tell if this is the case by re-challenging : ie stop, take the inhaler, then p 5 minutes try the exact same activity (intensity of workload) that just caused the symptoms and see if they are substantially diminished or not after saba 3) if there is an activity that reproducibly causes the symptoms, try the saba 15 min before the activity on alternate days   If in fact the saba really does help, then fine to continue to use it prn but advised may need to look closer at the maintenance regimen being used to achieve better control of airways disease with  exertion.

## 2021-04-20 NOTE — Assessment & Plan Note (Signed)
Seen first on CT abd 7//9/20  -  PFTs 10/21/2019 wnl with nl dlco - 04/20/2020 declines f/u CT due to insurance issues  - 09/25/2020   HRCT c/w UIP > referred to PF clinic> started on Esbriet 12/10/20   Reviewed the fixed doe he can expect from IPF vs the variable and symbicort responsive doe he may experience from AB   >>> f/u with MR for both if he prefers or here q year for symbicort refills.          Each maintenance medication was reviewed in detail including emphasizing most importantly the difference between maintenance and prns and under what circumstances the prns are to be triggered using an action plan format where appropriate.  Total time for H and P, chart review, counseling, reviewing hfa  device(s) and generating customized AVS unique to this office visit / same day charting = 34 min

## 2021-04-20 NOTE — Patient Instructions (Addendum)
Plan A = Automatic = Always=  Symbicort 160 2 puffs 15 min before you start getting physically active each day and take the other 2puffs 12 hours later if not doing great  Plan B = Backup (to supplement plan A, not to replace it) Only use your albuterol inhaler as a rescue medication to be used if you can't catch your breath by resting or doing a relaxed purse lip breathing pattern.  - The less you use it, the better it will work when you need it. - Ok to use the inhaler up to 2 puffs  every 4 hours if you must but call for appointment if use goes up over your usual need - Don't leave home without it !!  (think of it like the spare tire for your car)   Plan C = Crisis (instead of Plan B but only if Plan B stops working) - only use your albuterol nebulizer if you first try Plan B and it fails to help > ok to use the nebulizer up to every 4 hours but if start needing it regularly call for immediate appointment  Ok to Try albuterol 15 min before an activity (on alternating days)  that you know would make you short of breath and see if it makes any difference and if makes none then don't take albuterol after activity unless you can't catch your breath as this means it's the resting that helps, not the albuterol.       Please schedule a follow up visit in 12 months but call sooner if needed
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
## 2021-05-04 ENCOUNTER — Other Ambulatory Visit (HOSPITAL_COMMUNITY): Payer: Self-pay

## 2021-05-18 ENCOUNTER — Other Ambulatory Visit (HOSPITAL_COMMUNITY): Payer: Self-pay

## 2021-05-20 DIAGNOSIS — E039 Hypothyroidism, unspecified: Secondary | ICD-10-CM | POA: Diagnosis not present

## 2021-05-20 DIAGNOSIS — E7800 Pure hypercholesterolemia, unspecified: Secondary | ICD-10-CM | POA: Diagnosis not present

## 2021-05-20 DIAGNOSIS — N183 Chronic kidney disease, stage 3 unspecified: Secondary | ICD-10-CM | POA: Diagnosis not present

## 2021-05-20 DIAGNOSIS — M2550 Pain in unspecified joint: Secondary | ICD-10-CM | POA: Diagnosis not present

## 2021-05-20 DIAGNOSIS — M25522 Pain in left elbow: Secondary | ICD-10-CM | POA: Diagnosis not present

## 2021-05-21 ENCOUNTER — Other Ambulatory Visit: Payer: Self-pay | Admitting: Pharmacist

## 2021-05-21 DIAGNOSIS — Z5181 Encounter for therapeutic drug level monitoring: Secondary | ICD-10-CM

## 2021-05-21 NOTE — Progress Notes (Signed)
Patient due for Esbriet labs. Future hepatic function panel order placed today. Patient advised via Mychart of lab hours.  CMP Latest Ref Rng & Units 04/20/2021 03/16/2021 02/08/2021  Glucose 70 - 99 mg/dL 104(H) 102(H) -  BUN 6 - 23 mg/dL 13 16 -  Creatinine 0.40 - 1.50 mg/dL 1.40 1.22 -  Sodium 135 - 145 mEq/L 139 137 -  Potassium 3.5 - 5.1 mEq/L 4.0 4.3 -  Chloride 96 - 112 mEq/L 103 102 -  CO2 19 - 32 mEq/L 28 28 -  Calcium 8.4 - 10.5 mg/dL 9.2 9.5 -  Total Protein 6.0 - 8.3 g/dL 7.1 7.4 6.7  Total Bilirubin 0.2 - 1.2 mg/dL 0.4 0.4 0.5  Alkaline Phos 39 - 117 U/L 60 66 61  AST 0 - 37 U/L '21 25 20  '$ ALT 0 - 53 U/L 39 42 30   CBC    Component Value Date/Time   WBC 8.1 11/12/2020 1237   RBC 4.85 11/12/2020 1237   HGB 15.0 11/12/2020 1237   HCT 44.8 11/12/2020 1237   PLT 363.0 11/12/2020 1237   MCV 92.4 11/12/2020 1237   MCH 31.3 09/07/2020 2306   MCHC 33.4 11/12/2020 1237   RDW 14.3 11/12/2020 1237   LYMPHSABS 2.9 11/12/2020 1237   MONOABS 0.6 11/12/2020 1237   EOSABS 0.1 11/12/2020 1237   BASOSABS 0.1 11/12/2020 1237    Najee Manninen, PharmD, MPH, BCPS Clinical Pharmacist (Rheumatology and Pulmonology)

## 2021-06-01 ENCOUNTER — Other Ambulatory Visit (HOSPITAL_COMMUNITY): Payer: Self-pay

## 2021-06-07 ENCOUNTER — Other Ambulatory Visit (INDEPENDENT_AMBULATORY_CARE_PROVIDER_SITE_OTHER): Payer: Medicare Other

## 2021-06-07 DIAGNOSIS — Z5181 Encounter for therapeutic drug level monitoring: Secondary | ICD-10-CM | POA: Diagnosis not present

## 2021-06-07 LAB — CBC WITH DIFFERENTIAL/PLATELET
Basophils Absolute: 0.1 10*3/uL (ref 0.0–0.1)
Basophils Relative: 1.1 % (ref 0.0–3.0)
Eosinophils Absolute: 0.1 10*3/uL (ref 0.0–0.7)
Eosinophils Relative: 1.3 % (ref 0.0–5.0)
HCT: 44.8 % (ref 39.0–52.0)
Hemoglobin: 15.1 g/dL (ref 13.0–17.0)
Lymphocytes Relative: 32.9 % (ref 12.0–46.0)
Lymphs Abs: 2.9 10*3/uL (ref 0.7–4.0)
MCHC: 33.7 g/dL (ref 30.0–36.0)
MCV: 94 fl (ref 78.0–100.0)
Monocytes Absolute: 0.7 10*3/uL (ref 0.1–1.0)
Monocytes Relative: 7.5 % (ref 3.0–12.0)
Neutro Abs: 5.1 10*3/uL (ref 1.4–7.7)
Neutrophils Relative %: 57.2 % (ref 43.0–77.0)
Platelets: 300 10*3/uL (ref 150.0–400.0)
RBC: 4.77 Mil/uL (ref 4.22–5.81)
RDW: 13.4 % (ref 11.5–15.5)
WBC: 8.9 10*3/uL (ref 4.0–10.5)

## 2021-06-07 LAB — COMPREHENSIVE METABOLIC PANEL
ALT: 36 U/L (ref 0–53)
AST: 21 U/L (ref 0–37)
Albumin: 4 g/dL (ref 3.5–5.2)
Alkaline Phosphatase: 60 U/L (ref 39–117)
BUN: 17 mg/dL (ref 6–23)
CO2: 29 mEq/L (ref 19–32)
Calcium: 9.2 mg/dL (ref 8.4–10.5)
Chloride: 104 mEq/L (ref 96–112)
Creatinine, Ser: 1.41 mg/dL (ref 0.40–1.50)
GFR: 54.77 mL/min — ABNORMAL LOW (ref >60.00)
Glucose, Bld: 104 mg/dL — ABNORMAL HIGH (ref 70–99)
Potassium: 4.1 mEq/L (ref 3.5–5.1)
Sodium: 139 mEq/L (ref 135–145)
Total Bilirubin: 0.3 mg/dL (ref 0.2–1.2)
Total Protein: 7.1 g/dL (ref 6.0–8.3)

## 2021-06-08 ENCOUNTER — Other Ambulatory Visit (HOSPITAL_COMMUNITY): Payer: Self-pay

## 2021-06-09 DIAGNOSIS — D234 Other benign neoplasm of skin of scalp and neck: Secondary | ICD-10-CM | POA: Diagnosis not present

## 2021-06-09 DIAGNOSIS — Z08 Encounter for follow-up examination after completed treatment for malignant neoplasm: Secondary | ICD-10-CM | POA: Diagnosis not present

## 2021-06-09 DIAGNOSIS — Z85828 Personal history of other malignant neoplasm of skin: Secondary | ICD-10-CM | POA: Diagnosis not present

## 2021-06-09 DIAGNOSIS — L400 Psoriasis vulgaris: Secondary | ICD-10-CM | POA: Diagnosis not present

## 2021-06-09 DIAGNOSIS — L821 Other seborrheic keratosis: Secondary | ICD-10-CM | POA: Diagnosis not present

## 2021-06-16 ENCOUNTER — Other Ambulatory Visit (HOSPITAL_COMMUNITY): Payer: Self-pay

## 2021-06-21 ENCOUNTER — Other Ambulatory Visit (HOSPITAL_COMMUNITY): Payer: Self-pay

## 2021-07-13 ENCOUNTER — Other Ambulatory Visit (HOSPITAL_COMMUNITY): Payer: Self-pay

## 2021-07-15 ENCOUNTER — Other Ambulatory Visit (HOSPITAL_COMMUNITY): Payer: Self-pay

## 2021-08-04 IMAGING — CT CT CHEST HIGH RESOLUTION W/O CM
2 of 7 series · 14 of 36 positions shown, 17 images · non-contrast
Comparison: No priors.

CLINICAL DATA: 59-year-old male with history of asbestos exposure.
Intermittent wheezing. Former smoker (quit 4 years ago). Suspected
interstitial lung disease.

EXAM:
CT CHEST WITHOUT CONTRAST
TECHNIQUE: Multidetector CT imaging of the chest was performed following the
standard protocol without intravenous contrast. High resolution
imaging of the lungs, as well as inspiratory and expiratory imaging,
was performed.

[Series 4: high resolution · axial · 0.75mm/px · z∈[-342,-48]mm · 11 of 354 slices shown, 14 images]
[im 30/354  mediastinal]
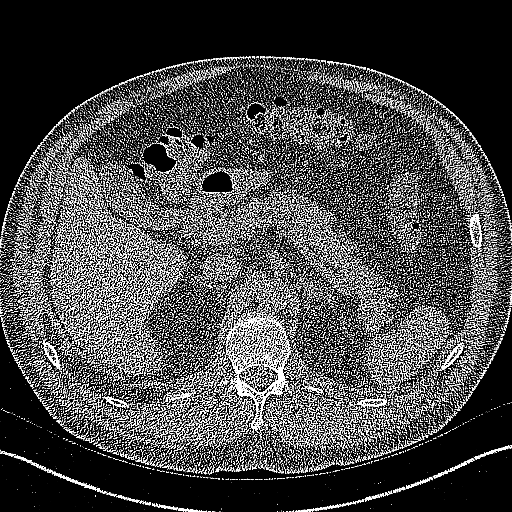
[im 30/354  lung]
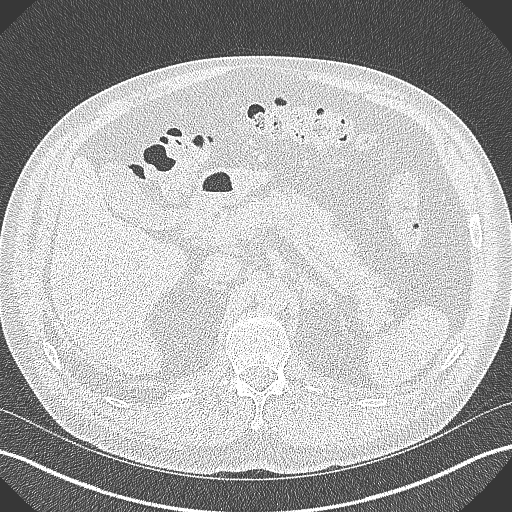
[im 59/354  lung]
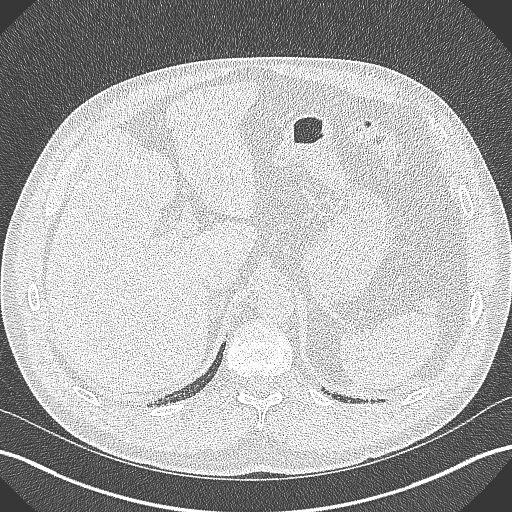
[im 89/354  lung]
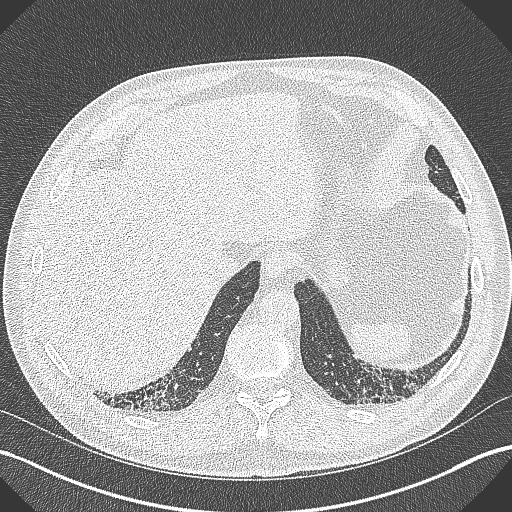
[im 118/354  lung]
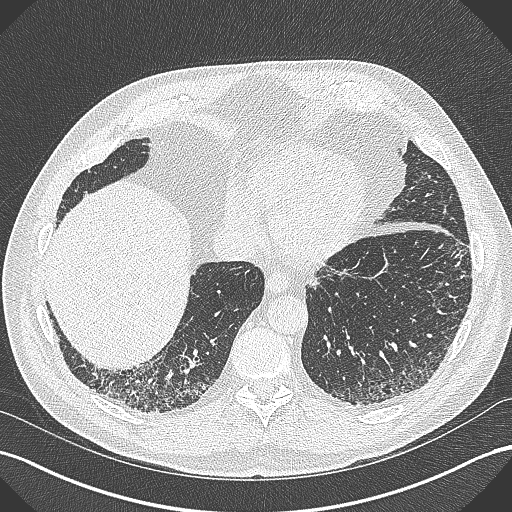
[im 148/354  mediastinal]
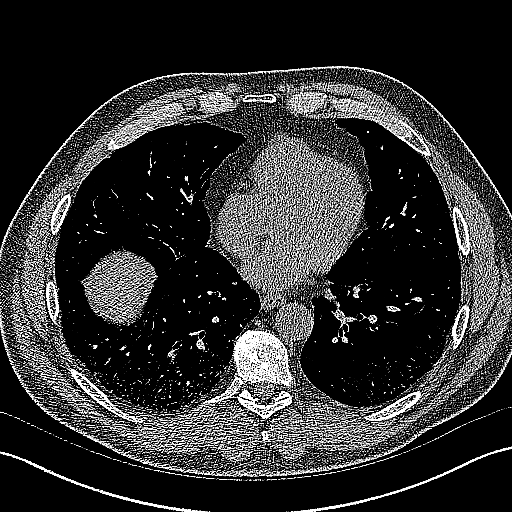
[im 148/354  lung]
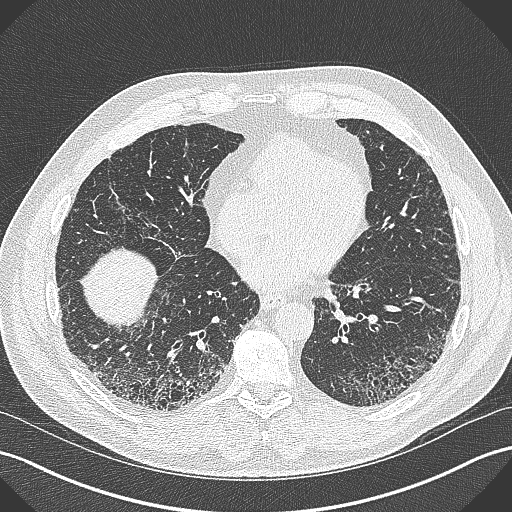
[im 177/354  lung]
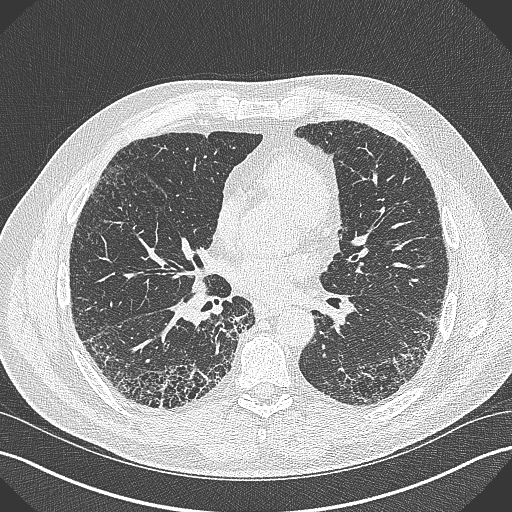
[im 206/354  lung]
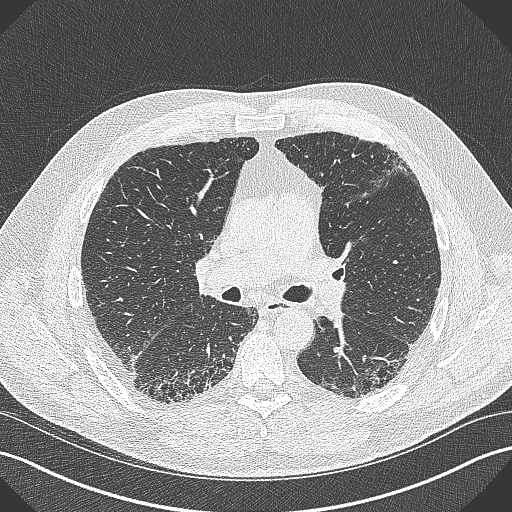
[im 236/354  lung]
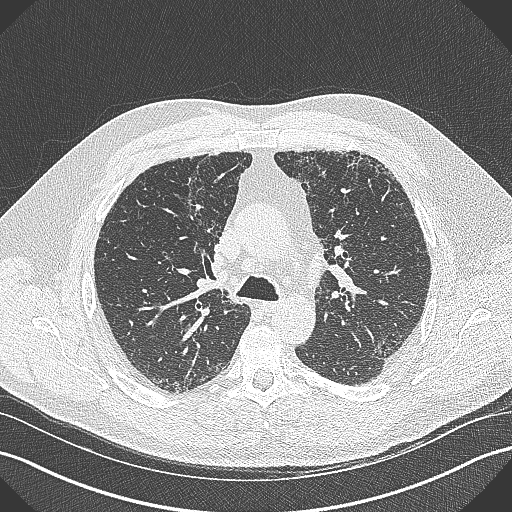
[im 265/354  mediastinal]
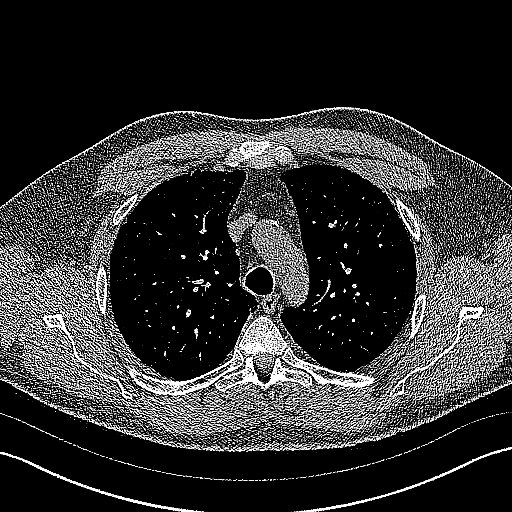
[im 265/354  lung]
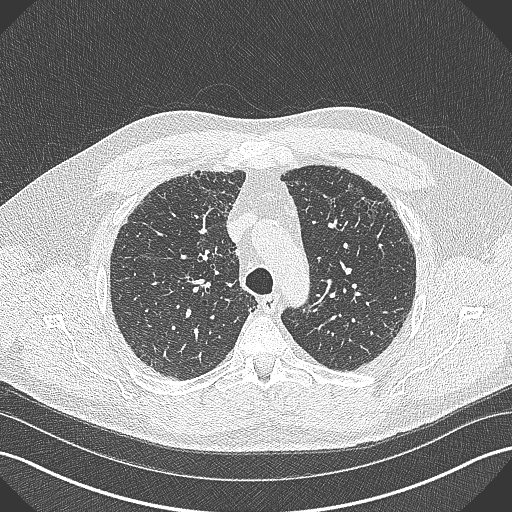
[im 295/354  lung]
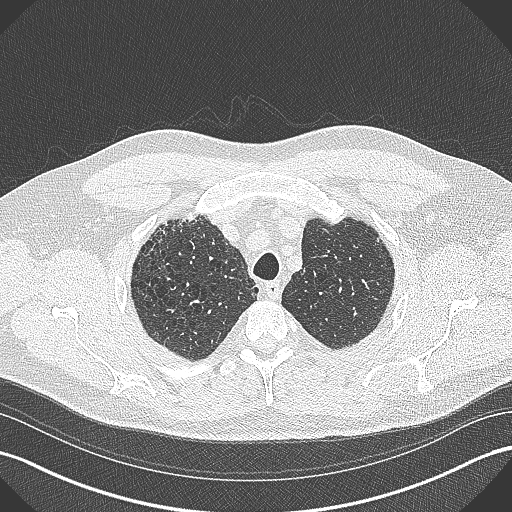
[im 324/354  lung]
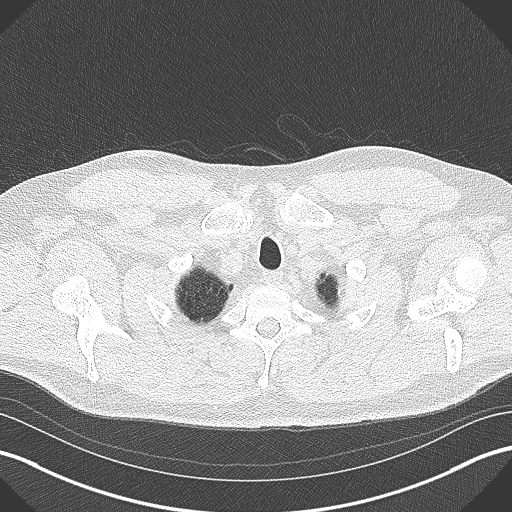

[Series 6: coronal · coronal · 0.70mm/px · 3 of 146 slices shown]
[im 30/146  lung]
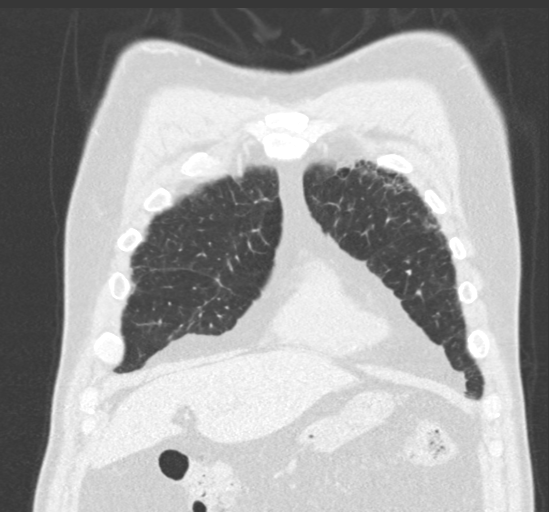
[im 59/146  lung]
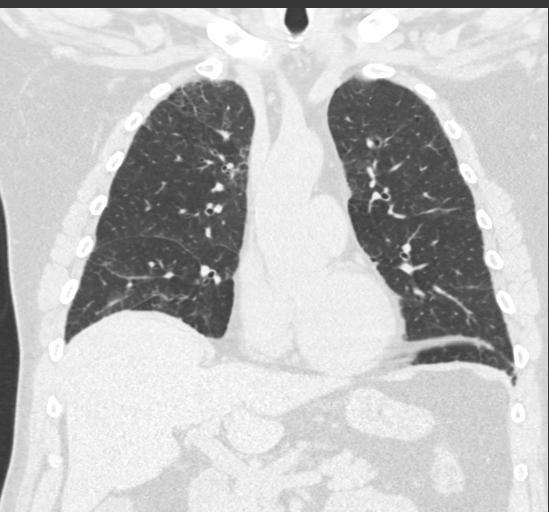
[im 88/146  lung]
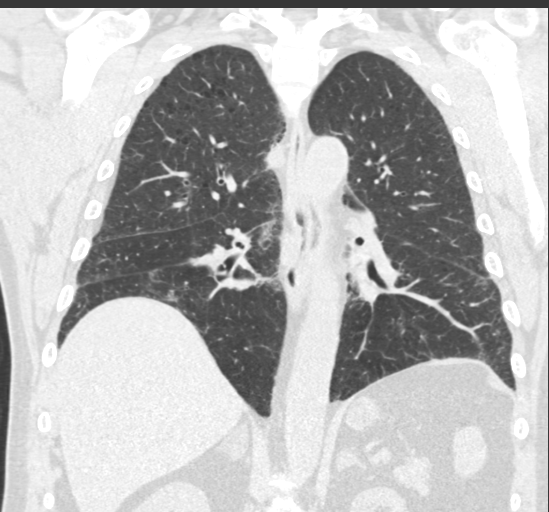

[14 of 36 positions shown; findings below may reference images not displayed]

FINDINGS: Cardiovascular: Heart size is normal. There is no significant
pericardial fluid, thickening or pericardial calcification. There is
aortic atherosclerosis, as well as atherosclerosis of the great
vessels of the mediastinum and the coronary arteries, including
calcified atherosclerotic plaque in the left anterior descending and
right coronary arteries.

Mediastinum/Nodes: No pathologically enlarged mediastinal or hilar
lymph nodes. Please note that accurate exclusion of hilar adenopathy
is limited on noncontrast CT scans. Esophagus is unremarkable in
appearance. No axillary lymphadenopathy.

Lungs/Pleura: High-resolution images demonstrate patchy regions of
ground-glass attenuation, septal thickening, subpleural
reticulation, traction bronchiectasis and frank honeycombing.
Findings have a craniocaudal gradient. Inspiratory and expiratory
imaging is unremarkable. No acute consolidative airspace disease. No
pleural effusions. No definite suspicious appearing pulmonary
nodules or masses are noted. Mild diffuse bronchial wall thickening
with mild centrilobular and paraseptal emphysema.

Upper Abdomen: Low-attenuation adrenal nodules bilaterally measuring
2.9 x 1.9 cm on the right (6 HU) and 2.0 x 1.6 cm on the left (3
HU), compatible with benign adrenal adenomas. Aortic
atherosclerosis.

Musculoskeletal: There are no aggressive appearing lytic or blastic
lesions noted in the visualized portions of the skeleton.
IMPRESSION: 1. The appearance of the lungs is compatible with interstitial lung
disease, with a spectrum of findings categorized as usual
interstitial pneumonia (UIP) per current ATS guidelines.
2. There is also mild diffuse bronchial wall thickening with mild
centrilobular and paraseptal emphysema; imaging findings suggestive
of underlying COPD.
3. Aortic atherosclerosis, in addition to left anterior descending
and right coronary artery disease. Please note that although the
presence of coronary artery calcium documents the presence of
coronary artery disease, the severity of this disease and any
potential stenosis cannot be assessed on this non-gated CT
examination. Assessment for potential risk factor modification,
dietary therapy or pharmacologic therapy may be warranted, if
clinically indicated.
4. Bilateral adrenal adenomas, as above.

Aortic Atherosclerosis (TJ1CK-0SO.O) and Emphysema (TJ1CK-DJ3.1).

## 2021-08-10 ENCOUNTER — Other Ambulatory Visit (HOSPITAL_COMMUNITY): Payer: Self-pay

## 2021-08-11 ENCOUNTER — Other Ambulatory Visit (HOSPITAL_COMMUNITY): Payer: Self-pay

## 2021-09-07 ENCOUNTER — Other Ambulatory Visit (HOSPITAL_COMMUNITY): Payer: Self-pay

## 2021-09-09 ENCOUNTER — Other Ambulatory Visit (HOSPITAL_COMMUNITY): Payer: Self-pay

## 2021-09-14 ENCOUNTER — Telehealth: Payer: Self-pay

## 2021-09-14 NOTE — Telephone Encounter (Signed)
Received notification from [facility] regarding a prior authorization for PIRFENIDONE. Authorization has been APPROVED from 09/14/2021 to 09/18/2022. Approval letter sent to scan center. Updated [employer] with current Prior Authorization information.  Authorization # [id number]

## 2021-09-14 NOTE — Telephone Encounter (Signed)
Per Rinaldo Ratel, current Prior Authorization is expiring.  Submitted a Prior Authorization request to [facility] for PIRFENIDONE via CoverMyMeds. Will update once we receive a response.   Key: [id number]

## 2021-10-04 ENCOUNTER — Other Ambulatory Visit (HOSPITAL_COMMUNITY): Payer: Self-pay

## 2021-10-07 ENCOUNTER — Other Ambulatory Visit: Payer: Self-pay | Admitting: Internal Medicine

## 2021-10-07 ENCOUNTER — Other Ambulatory Visit (HOSPITAL_COMMUNITY): Payer: Self-pay

## 2021-10-07 DIAGNOSIS — J84112 Idiopathic pulmonary fibrosis: Secondary | ICD-10-CM

## 2021-10-07 MED ORDER — PIRFENIDONE 801 MG PO TABS
801.0000 mg | ORAL_TABLET | Freq: Three times a day (TID) | ORAL | 1 refills | Status: DC
Start: 2021-10-07 — End: 2022-12-13
  Filled 2021-10-07: qty 90, 30d supply, fill #0
  Filled 2021-10-28: qty 90, 30d supply, fill #1
  Filled 2021-12-06: qty 90, 30d supply, fill #2

## 2021-10-08 ENCOUNTER — Other Ambulatory Visit (HOSPITAL_COMMUNITY): Payer: Self-pay

## 2021-10-11 ENCOUNTER — Other Ambulatory Visit (HOSPITAL_COMMUNITY): Payer: Self-pay

## 2021-10-19 ENCOUNTER — Other Ambulatory Visit (HOSPITAL_COMMUNITY): Payer: Self-pay

## 2021-10-28 ENCOUNTER — Other Ambulatory Visit (HOSPITAL_COMMUNITY): Payer: Self-pay

## 2021-11-08 ENCOUNTER — Other Ambulatory Visit (HOSPITAL_COMMUNITY): Payer: Self-pay

## 2021-11-22 DIAGNOSIS — M2550 Pain in unspecified joint: Secondary | ICD-10-CM | POA: Diagnosis not present

## 2021-11-22 DIAGNOSIS — E7800 Pure hypercholesterolemia, unspecified: Secondary | ICD-10-CM | POA: Diagnosis not present

## 2021-11-22 DIAGNOSIS — I700 Atherosclerosis of aorta: Secondary | ICD-10-CM | POA: Diagnosis not present

## 2021-11-22 DIAGNOSIS — E039 Hypothyroidism, unspecified: Secondary | ICD-10-CM | POA: Diagnosis not present

## 2021-11-22 DIAGNOSIS — Z23 Encounter for immunization: Secondary | ICD-10-CM | POA: Diagnosis not present

## 2021-11-22 DIAGNOSIS — J84112 Idiopathic pulmonary fibrosis: Secondary | ICD-10-CM | POA: Diagnosis not present

## 2021-11-22 DIAGNOSIS — N183 Chronic kidney disease, stage 3 unspecified: Secondary | ICD-10-CM | POA: Diagnosis not present

## 2021-11-24 ENCOUNTER — Other Ambulatory Visit: Payer: Self-pay

## 2021-11-24 ENCOUNTER — Encounter (HOSPITAL_COMMUNITY): Payer: Self-pay | Admitting: *Deleted

## 2021-11-24 ENCOUNTER — Emergency Department (HOSPITAL_COMMUNITY): Payer: Medicare Other

## 2021-11-24 ENCOUNTER — Emergency Department (HOSPITAL_COMMUNITY)
Admission: EM | Admit: 2021-11-24 | Discharge: 2021-11-24 | Disposition: A | Discharge status: Home / Self Care | Payer: Medicare Other | Attending: Emergency Medicine | Admitting: Emergency Medicine

## 2021-11-24 DIAGNOSIS — W228XXA Striking against or struck by other objects, initial encounter: Secondary | ICD-10-CM | POA: Diagnosis not present

## 2021-11-24 DIAGNOSIS — Z7982 Long term (current) use of aspirin: Secondary | ICD-10-CM | POA: Diagnosis not present

## 2021-11-24 DIAGNOSIS — S6991XA Unspecified injury of right wrist, hand and finger(s), initial encounter: Secondary | ICD-10-CM

## 2021-11-24 DIAGNOSIS — M7989 Other specified soft tissue disorders: Secondary | ICD-10-CM | POA: Diagnosis not present

## 2021-11-24 DIAGNOSIS — Y990 Civilian activity done for income or pay: Secondary | ICD-10-CM | POA: Diagnosis not present

## 2021-11-24 DIAGNOSIS — M79641 Pain in right hand: Secondary | ICD-10-CM | POA: Insufficient documentation

## 2021-11-24 NOTE — Discharge Instructions (Signed)
Your x-ray shows some signs of potential old injuries to the area.  They do not show an obvious break in the area that you are having the most pain.  I have given you information to follow-up with a hand surgeon if he would like.  Please follow-up with your family doctor in the office. ? ?Take 4 over the counter ibuprofen tablets 3 times a day or 2 over-the-counter naproxen tablets twice a day for pain. ?Also take tylenol '1000mg'$ (2 extra strength) four times a day.  ? ? ?

## 2021-11-24 NOTE — Progress Notes (Signed)
Orthopedic Tech Progress Note ?Patient Details:  ?Erik Strong ?01/05/1961 ?[id number] ? ?Ortho Devices ?Type of Ortho Device: Finger splint ?Ortho Device/Splint Location: right. 5th ?Ortho Device/Splint Interventions: Ordered, Application ?  ?Post Interventions ?Patient Tolerated: Well ?Instructions Provided: Adjustment of device ? ?Charline Bills [place] ?11/24/2021, 1:36 PM ?Applied finger splint. ?

## 2021-11-24 NOTE — ED Provider Notes (Signed)
?[facility] ?Provider Note ? ? ?CSN: [id number] ?Arrival date & time: 11/24/21  1052 ? ?  ? ?History ? ?Chief Complaint  ?Patient presents with  ? Finger Injury  ? ? ?Erik Strong is a 61 y.o. male. ? ?61 yo M with a chief complaint of right pinky pain.  He tells me that he was loading things into a trailer and he swung his arm quickly and back the knuckle on the trailer.  He has had some pain especially with movement.  He placed a homemade splint on it with some improvement but came here for evaluation. ? ? ? ?  ? ?Home Medications ?Prior to Admission medications   ?Medication Sig Start Date End Date Taking? Authorizing Provider  ?acetaminophen (TYLENOL) 325 MG tablet Take 2 tablets (650 mg total) by mouth every 6 (six) hours. 09/03/19   Milly Jakob, MD  ?albuterol (VENTOLIN HFA) 108 (90 Base) MCG/ACT inhaler TAKE 2 PUFFS BY MOUTH EVERY 6 HOURS AS NEEDED FOR WHEEZE OR SHORTNESS OF BREATH 03/31/21   Brand Males, MD  ?ALPRAZolam Duanne Moron) 0.5 MG tablet Take 0.5 mg by mouth at bedtime.    [provider]  ?amphetamine-dextroamphetamine (ADDERALL XR) 30 MG 24 hr capsule TAKE 1 CAPSULE BY MOUTH ONCE A DAY IN THE MORNING 30 DAYS 11/13/20 05/12/21  Alroy Dust, L.[name], MD  ?amphetamine-dextroamphetamine (ADDERALL) 10 MG tablet Take 10 mg by mouth daily at 2 PM.  11/02/16   [provider]  ?aspirin EC 81 MG tablet Take 1 tablet (81 mg total) by mouth daily. Swallow whole. 12/04/20   Freada Bergeron, MD  ?budesonide-formoterol [facility]) 160-4.5 MCG/ACT inhaler Inhale 2 puffs into the lungs 2 (two) times daily. 01/04/21   Martyn Ehrich, NP  ?levothyroxine (SYNTHROID) 50 MCG tablet Take 50 mcg by mouth daily before breakfast. 08/12/19   [provider]  ?phenylephrine-shark liver oil-mineral oil-petrolatum (PREPARATION H) 0.25-3-14-71.9 % rectal ointment Place 1 application rectally daily as needed for hemorrhoids.    [provider]   ?Pirfenidone (ESBRIET) 801 MG TABS Take 1 tablet (801 mg) by mouth in the morning, at noon, and at bedtime. 10/07/21   Brand Males, MD  ?rosuvastatin (CRESTOR) 20 MG tablet Take 1 tablet (20 mg total) by mouth daily. 12/04/20   Freada Bergeron, MD  ?sildenafil (REVATIO) 20 MG tablet Take 20 mg by mouth daily as needed (FOR ED).     [provider]  ?tiZANidine (ZANAFLEX) 2 MG tablet Take 2 mg by mouth at bedtime.    [provider]  ?traMADol (ULTRAM) 50 MG tablet Take 50 mg by mouth See admin instructions. Take one tablet (50 mg) by mouth daily at bedtime, may also take one tablet (50 mg) twice daily as needed for pain 04/03/17   [provider]  ?   ? ?Allergies    ?Other, Codeine, Macrodantin [nitrofurantoin macrocrystal], and Shellfish allergy   ? ?Review of Systems   ?Review of Systems ? ?Physical Exam ?Updated Vital Signs ?BP (!) 143/75 (BP Location: Left Arm)   Pulse 69   Temp 98.6 ?F (37 ?C) (Oral)   Resp 16   SpO2 98%  ?Physical Exam ?Vitals and nursing note reviewed.  ?Constitutional:   ?   Appearance: He is well-developed.  ?HENT:  ?   Head: Normocephalic and atraumatic.  ?Eyes:  ?   Pupils: Pupils are equal, round, and reactive to light.  ?Neck:  ?   Vascular: No JVD.  ?Cardiovascular:  ?  Rate and Rhythm: Normal rate and regular rhythm.  ?   Heart sounds: No murmur heard. ?  No friction rub. No gallop.  ?Pulmonary:  ?   Effort: No respiratory distress.  ?   Breath sounds: No wheezing.  ?Abdominal:  ?   General: There is no distension.  ?   Tenderness: There is no abdominal tenderness. There is no guarding or rebound.  ?Musculoskeletal:     ?   General: Tenderness present. Normal range of motion.  ?   Cervical back: Normal range of motion and neck supple.  ?   Comments: Tenderness mostly over the PIP of the right fifth digit.  No significant pain at the MCP no significant pain at the distal fifth metacarpal  ?Skin: ?   Coloration: Skin is not pale.  ?   Findings:  No rash.  ?Neurological:  ?   Mental Status: He is alert and oriented to person, place, and time.  ?Psychiatric:     ?   Behavior: Behavior normal.  ? ? ?ED Results / Procedures / Treatments   ?Labs ?(all labs ordered are listed, but only abnormal results are displayed) ?Labs Reviewed - No data to display ? ?EKG ?None ? ?Radiology ?DG Hand Complete Right ? ?Result Date: 11/24/2021 ?CLINICAL DATA:  Fifth digit injury yesterday. Caught between metal bar. Pain and swelling. EXAM: RIGHT HAND - COMPLETE 3+ VIEW COMPARISON:  None FINDINGS: Normal bone mineralization. There is anterior angulation of the distal shaft of the fifth metacarpal indicating an angulated fracture, however no acute fracture is seen in this is favored to represent an old injury. Recommend clinical correlation. There is very subtle linear lucency at the medial base of the proximal phalanx of the fifth finger. This does not appear to represent a definitive discrete fracture line, however this is difficult to exclude. Recommend clinical correlation for point tenderness in this region. This may be at the volar aspect of the base of the proximal phalanx on lateral view. Mild joint space narrowing of the thumb interphalangeal joint and the DIP joints diffusely. Moderate triscaphe joint and mild thumb carpometacarpal joint space narrowing. IMPRESSION:: IMPRESSION: 1. Anterior angulation of the distal shaft of the fifth metacarpal without acute fracture line seen. This likely reflects a remote fracture. 2. No definite acute fracture is seen, however there is subtle curvilinear lucency within the medial base of the proximal phalanx of fifth finger, and it is difficult to exclude a tiny fracture. Recommend clinical correlation for point tenderness in this region. Electronically Signed   By: Yvonne Kendall M.D.   On: 11/24/2021 11:55   ? ?Procedures ?Procedures  ? ? ?Medications Ordered in ED ?Medications - No data to display ? ?ED Course/ Medical Decision  Making/ A&P ?  ?                        ?Medical Decision Making ? ?49 yoM with a chief complaints of right pinky pain after banging it against a trailer.  Exam is mostly benign.  He has some mild swelling around the PIP.  Plain film independently interpreted by me with possible fracture of the proximal phalanx.  No obvious fracture there clinically.  Will place in a static splint.  Given orthopedics follow-up if needed. ? ?12:53 PM:  I have discussed the diagnosis/risks/treatment options with the patient.  Evaluation and diagnostic testing in the emergency department does not suggest an emergent condition requiring admission or immediate intervention beyond what  has been performed at this time.  They will follow up with  Hand, PCP. We also discussed returning to the ED immediately if new or worsening sx occur. We discussed the sx which are most concerning (e.g., sudden worsening pain, fever, inability to tolerate by mouth) that necessitate immediate return. Medications administered to the patient during their visit and any new prescriptions provided to the patient are listed below. ? ?Medications given during this visit ?Medications - No data to display ? ? ?The patient appears reasonably screen and/or stabilized for discharge and I doubt any other medical condition or other [facility] requiring further screening, evaluation, or treatment in the ED at this time prior to discharge.  ? ? ? ? ? ? ? ? ?Final Clinical Impression(s) / ED Diagnoses ?Final diagnoses:  ?Injury of finger of right hand, initial encounter  ? ? ?Rx / DC Orders ?ED Discharge Orders   ? ? None  ? ?  ? ? ?  ?Deno Etienne, DO ?11/24/21 1253 ? ?

## 2021-11-24 NOTE — ED Provider Triage Note (Signed)
Emergency Medicine Provider Triage Evaluation Note ? ?Erik Strong , a 61 y.o. male  was evaluated in triage.  Pt complains of right fifth digit pain at the PIP joint.  He injured this area last night after bumping into something.  Pain is worse with palpation and certain movements.  Denies any numbness. ? ?Review of Systems  ?Positive: Right fifth digit pain ?Negative: Numbness ? ?Physical Exam  ?BP (!) 143/75 (BP Location: Left Arm)   Pulse 69   Temp 98.6 ?F (37 ?C) (Oral)   Resp 16   SpO2 98%  ?Gen:   Awake, no distress   ?Resp:  Normal effort  ?MSK:   Moves extremities without difficulty  ?Other:  2+ radial pulse palpated.  Tenderness palpation pinpoint of the right PIP joint of the fifth digit ? ?Medical Decision Making  ?Medically screening exam initiated at 11:22 AM.  Appropriate orders placed.  Erik Strong was informed that the remainder of the evaluation will be completed by another provider, this initial triage assessment does not replace that evaluation, and the importance of remaining in the ED until their evaluation is complete. ? ?X-ray ordered ?  ?Delia Heady, PA-C ?11/24/21 1123 ? ?

## 2021-11-24 NOTE — ED Triage Notes (Signed)
Pt reports injuring right little finger at work yesterday, mild swelling noted.  ?

## 2021-11-29 ENCOUNTER — Other Ambulatory Visit: Payer: Self-pay | Admitting: *Deleted

## 2021-11-29 MED ORDER — ROSUVASTATIN CALCIUM 20 MG PO TABS: 20.0000 mg | ORAL_TABLET | Freq: Every day | ORAL | 0 refills | Status: DC

## 2021-11-30 ENCOUNTER — Other Ambulatory Visit (HOSPITAL_COMMUNITY): Payer: Self-pay

## 2021-12-06 ENCOUNTER — Other Ambulatory Visit (HOSPITAL_COMMUNITY): Payer: Self-pay

## 2021-12-10 ENCOUNTER — Telehealth: Payer: Self-pay

## 2021-12-10 ENCOUNTER — Other Ambulatory Visit (HOSPITAL_COMMUNITY): Payer: Self-pay

## 2021-12-10 NOTE — Telephone Encounter (Addendum)
Received notification from [facility] that pt's grant has expired. Pt has previously signed [place] enrollment forms, reached out and discussed with pt plan to proceed with PAP through [place] as new grants are few and far in-between. Pt verbalized agreement and understanding with plan. Will complete provider form and place in MR's box for signature. ? ?Pt portion placed in PAP pending folder. ?

## 2021-12-10 NOTE — Telephone Encounter (Signed)
Patient is scheduled with Dr. Chase Caller on 01/25/2022 at 10:30am. Appointment reminder mailed. ?

## 2021-12-17 NOTE — Telephone Encounter (Signed)
Submitted Patient Assistance Application to [employer] for ESBRIET along with provider portion, PA and income documents. Will update patient when we receive a response.  Fax# [phone] Phone# [phone] 

## 2021-12-22 NOTE — Telephone Encounter (Signed)
Received a fax from [place] regarding an approval for [employer] patient assistance from 12/22/2021 until patient no longer qualifies due to discontinuation of therapy, changes to patient's current financial status or health insurance and/or patient no longer meets the program eligibility requirements.  ATC pt but LVM requesting pt return call. Direct office number provided.  Relevant Documents have been sent to scan center. Will await pt's return call. ? ?[employer] Phone#: [phone] option 5 ?[employer] Phone#: [phone] ?

## 2021-12-22 NOTE — Telephone Encounter (Signed)
Spoke to pt and he confirmed that [place] had already contacted him, and he was waiting for [employer] to reach out to him as well. Advised that he could go ahead and call them himself if he would like to expedite the process. Went over the yearly renewal process with him as well. Pt verbalized understanding to all and nothing further is needed at this time. ?

## 2021-12-23 ENCOUNTER — Other Ambulatory Visit (HOSPITAL_COMMUNITY): Payer: Self-pay

## 2021-12-24 ENCOUNTER — Other Ambulatory Visit (HOSPITAL_COMMUNITY): Payer: Self-pay

## 2022-01-25 ENCOUNTER — Ambulatory Visit: Payer: Medicare Other | Admitting: Internal Medicine

## 2022-01-25 ENCOUNTER — Encounter: Payer: Self-pay | Admitting: Internal Medicine

## 2022-01-25 VITALS — BP 126/72 | HR 57 | Temp 98.1°F | Ht 73.0 in | Wt 231.4 lb

## 2022-01-25 DIAGNOSIS — J449 Chronic obstructive pulmonary disease, unspecified: Secondary | ICD-10-CM

## 2022-01-25 DIAGNOSIS — R0609 Other forms of dyspnea: Secondary | ICD-10-CM

## 2022-01-25 DIAGNOSIS — E278 Other specified disorders of adrenal gland: Secondary | ICD-10-CM

## 2022-01-25 DIAGNOSIS — Z5181 Encounter for therapeutic drug level monitoring: Secondary | ICD-10-CM

## 2022-01-25 DIAGNOSIS — J84112 Idiopathic pulmonary fibrosis: Secondary | ICD-10-CM

## 2022-01-25 LAB — BASIC METABOLIC PANEL
BUN: 14 mg/dL (ref 6–23)
CO2: 31 mEq/L (ref 19–32)
Calcium: 9.4 mg/dL (ref 8.4–10.5)
Chloride: 102 mEq/L (ref 96–112)
Creatinine, Ser: 1.28 mg/dL (ref 0.40–1.50)
GFR: 61.24 mL/min (ref >60.00)
Glucose, Bld: 101 mg/dL — ABNORMAL HIGH (ref 70–99)
Potassium: 4.3 mEq/L (ref 3.5–5.1)
Sodium: 138 mEq/L (ref 135–145)

## 2022-01-25 LAB — CBC WITH DIFFERENTIAL/PLATELET
Basophils Absolute: 0.1 10*3/uL (ref 0.0–0.1)
Basophils Relative: 0.8 % (ref 0.0–3.0)
Eosinophils Absolute: 0.1 10*3/uL (ref 0.0–0.7)
Eosinophils Relative: 0.9 % (ref 0.0–5.0)
HCT: 45.8 % (ref 39.0–52.0)
Hemoglobin: 15.2 g/dL (ref 13.0–17.0)
Lymphocytes Relative: 26.1 % (ref 12.0–46.0)
Lymphs Abs: 2.4 10*3/uL (ref 0.7–4.0)
MCHC: 33.3 g/dL (ref 30.0–36.0)
MCV: 93.6 fl (ref 78.0–100.0)
Monocytes Absolute: 0.6 10*3/uL (ref 0.1–1.0)
Monocytes Relative: 6.3 % (ref 3.0–12.0)
Neutro Abs: 6.1 10*3/uL (ref 1.4–7.7)
Neutrophils Relative %: 65.9 % (ref 43.0–77.0)
Platelets: 325 10*3/uL (ref 150.0–400.0)
RBC: 4.89 Mil/uL (ref 4.22–5.81)
RDW: 13.8 % (ref 11.5–15.5)
WBC: 9.2 10*3/uL (ref 4.0–10.5)

## 2022-01-25 LAB — HEPATIC FUNCTION PANEL
ALT: 44 U/L (ref 0–53)
AST: 28 U/L (ref 0–37)
Albumin: 4.1 g/dL (ref 3.5–5.2)
Alkaline Phosphatase: 69 U/L (ref 39–117)
Bilirubin, Direct: 0.1 mg/dL (ref 0.0–0.3)
Total Bilirubin: 0.4 mg/dL (ref 0.2–1.2)
Total Protein: 7.3 g/dL (ref 6.0–8.3)

## 2022-01-25 LAB — BRAIN NATRIURETIC PEPTIDE: Pro B Natriuretic peptide (BNP): 22 pg/mL (ref 0.0–100.0)

## 2022-01-25 NOTE — Patient Instructions (Addendum)
ICD-10-CM   ?1. IPF (idiopathic pulmonary fibrosis) (HCC)  J84.112 Hepatic function panel  ?  Pulmonary function test  ?  Hepatic function panel  ? ? ?#Pulmnary fibrosis - IPF ? ?-clinically stable ?- handling esbriet well  ? - recent changes in esbriet dosing schedule due to supply and insurance issues ?- last PFT Feb 2021 ?- last HRCT JAn 2022 ? ?Plan ? - continue esbriet ?- check cbc, bmet, LFT, Quantiferon gold , BNP 01/25/2022 ?- do spirometry and dlco in next 6-8 weeks ? - do HRCT in next 6-8 weeks ?-briefly discussed the idea of clinial trials ? ? ? ?EMphysema - alpha 1 MM ?  ?-- stable disease ? ?Plan ?- continue symbicort scheduled with albuterol as neede ? ? ?Followup ? - 6-8 weeks  with Dr Chase Caller - 30 min slot to review test results ?

## 2022-01-25 NOTE — Progress Notes (Signed)
? ? ? ? ?OV 11/12/2020 ? ?Subjective:  ?Patient ID: Erik Strong, male , DOB: 27-Mar-1961 , age 60 y.o. , MRN: [id number] , ADDRESS: [address] ?[place] [address] ?PCP Erik Strong, Erik Sa, Erik Strong ?Patient Care Team: ?Erik Strong, Erik Sa, Erik Strong as PCP - General (Family Medicine) ? ?This Provider for this visit: Treatment Team:  ?Attending Provider: Brand Males, Erik Strong ? ? ? ?11/12/2020 -   ?Chief Complaint  ?Patient presents with  ? New Patient (Initial Visit)  ?  Doing ok, winded at times  ? ? ? ?HPI ?Erik Strong 61 y.o. -referred by Dr. Christinia Gully to the ILD center because of discovery of pulmonary fibrosis he also has associated emphysema.  He says he has been disabled because of back issues.  He says he has severe ADD and sometimes he will have to search for key for 2 hours.  He says he can get overwhelmed easily and it might take multiple visits for him to fully grasp disease discussion and therapeutic management plan and prognosis. ? ?[place] Integrated Comprehensive ILD Questionnaire ? ?Symptoms: Insidious onset of shortness of breath gradually getting worse for the last several years.  Episodic dyspnea present severity scale below.  He does have difficulty keeping up with others of his age.  He does have some arthralgia.  He does have cough he coughs at night he brings up some phlegm.  It is whitish-green.  He does clear his throat he does feel this a tickle in the back of the throat occasional wheezing.  No nausea no vomiting no diarrhea. ? ? ? ?Past Medical History : As below but no collagen vascular disease or vasculitis.  Does have chronic kidney disease no history of blood clots ? ? ?ROS:   Does have fatigue arthralgia he does have cracked skin in his fingers.  Does have arthralgia.  Also has daytime somnolence.  He is not able to use his CPAP no recurrent fever no weight loss.  Does have ? ?FAMILY HISTORY of LUNG DISEASE:   Denies ? ? ?EXPOSURE HISTORY: Between 1978 and 2018 he smoked 30  cigarettes/day and then quit.  Does not smoke cigars no smoking pipes no vaping.  Current marijuana user.  In 2003 use some cocaine. ? ?HOME and HOBBY DETAILS : Single-family home in the [place] setting.  He is lived in the home for 50 years.  The home itself is 06/06/1961 built and is 60 years old.  There is dampness in the basement he does do some occasional yard work and Optician, dispensing but otherwise no exposure to mold organic antigens he does have cats and dogs ? ? ?OCCUPATIONAL HISTORY (122 questions) : He has done work with damp air-conditioned spaces.  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  Done pest control work.  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  Done carpentry done wood trimming.  Done oil heating done asbestos work done [employer] work.  Done epoxy reasons.  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  Done plastic work.  Done insulation work.  Done metal grinding done machine operating done industrial work.  Done lack ring done spray painting.  Done waterproofing done 5 working.  Done flooring.  Done metal legs.  Done Nurse, learning disability.  Also worked in dusty environment ? ? ?PULMONARY TOXICITY HISTORY (27 items): denies ? ? ? ? ? ? ?  CT Chest high Resolution 09/25/2020 -personally visualized and agree with the findings of classic UIP associated with some emphysema. ? ?Narrative & Impression  ?CLINICAL DATA:  61 year old male with history of asbestos exposure. ?Intermittent wheezing. Former smoker (quit 4 years ago). Suspected ?interstitial lung disease. ?  ?EXAM: ?CT CHEST WITHOUT CONTRAST ?  ?TECHNIQUE: ?Multidetector CT imaging of the chest was performed following the ?standard protocol without intravenous contrast. High resolution ?imaging of the lungs, as well as inspiratory and expiratory imaging, ?was performed. ?  ?COMPARISON:  No priors. ?   ?FINDINGS: ?Cardiovascular: Heart size is normal. There is no significant ?pericardial fluid, thickening or pericardial calcification. There is ?aortic atherosclerosis, as well as atherosclerosis of the great ?vessels of the mediastinum and the coronary arteries, including ?calcified atherosclerotic plaque in the left anterior descending and ?right coronary arteries. ?  ?Mediastinum/Nodes: No pathologically enlarged mediastinal or hilar ?lymph nodes. Please note that accurate exclusion of hilar adenopathy ?is limited on noncontrast CT scans. Esophagus is unremarkable in ?appearance. No axillary lymphadenopathy. ?  ?Lungs/Pleura: High-resolution images demonstrate patchy regions of ?ground-glass attenuation, septal thickening, subpleural ?reticulation, traction bronchiectasis and frank honeycombing. ?Findings have a craniocaudal gradient. Inspiratory and expiratory ?imaging is unremarkable. No acute consolidative airspace disease. No ?pleural effusions. No definite suspicious appearing pulmonary ?nodules or masses are noted. Mild diffuse bronchial wall thickening ?with mild centrilobular and paraseptal emphysema. ?  ?Upper Abdomen: Low-attenuation adrenal nodules bilaterally measuring ?2.9 x 1.9 cm on the right (6 HU) and 2.0 x 1.6 cm on the left (3 ?HU), compatible with benign adrenal adenomas. Aortic ?atherosclerosis. ?  ?Musculoskeletal: There are no aggressive appearing lytic or blastic ?lesions noted in the visualized portions of the skeleton. ?  ?IMPRESSION: ?1. 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 ?2. There is also mild diffuse bronchial wall thickening with mild ?centrilobular and paraseptal emphysema; imaging findings suggestive ?of underlying COPD. ?3. Aortic atherosclerosis, in addition to left anterior descending ?and right coronary artery disease. Please note  that although the ?presence of coronary artery calcium documents the presence of ?coronary artery disease, the severity of this disease and any ?potential stenosis cannot be assessed on this non-gated CT ?examination. Assessment for potential risk factor modification, ?dietary therapy or pharmacologic therapy may be warranted, if ?clinically indicated. ?4. Bilateral adrenal adenomas, as above. ?  ?Aortic Atherosclerosis (ICD10-I70.0) and Emphysema (ICD10-J43.9). ?  ?  ?Electronically Signed ?  By: Vinnie Langton M.D. ?  On: 09/25/2020 14:33  ? ? ? ? ?PFT ? ?PFT Results Latest Ref Rng & Units 10/21/2019  ?FVC-Pre L 4.59  ?FVC-Predicted Pre % 84  ?FVC-Post L 4.72  ?FVC-Predicted Post % 86  ?Pre FEV1/FVC % % 82  ?Post FEV1/FCV % % 79  ?FEV1-Pre L 3.76  ?FEV1-Predicted Pre % 90  ?FEV1-Post L 3.75  ?DLCO uncorrected ml/min/mmHg 27.70  ?DLCO UNC% % 88  ?DLCO corrected ml/min/mmHg 27.11  ?DLCO COR %Predicted % 86  ?DLVA Predicted % 91  ?TLC L 7.98  ?TLC % Predicted % 103  ?RV % Predicted % 140  ? ? ?Results for JAMORI, BIGGAR (MRN [id number]) as of 11/12/2020 11:00 ? Ref. Range 09/07/2020 23:06  ?Creatinine Latest Ref Range: 0.61 - 1.24 mg/dL 1.63 (H)  ?Results for VIRAAT, VANPATTEN (MRN [id number]) as of 11/12/2020 11:00 ? Ref. Range 09/07/2020 23:06  ?BASIC METABOLIC PANEL Unknown Rpt (A)  ?Sodium Latest Ref Range: 135 -  145 mmol/L 139  ?Potassium Latest Ref Range: 3.5 - 5.1 mmol/L 4.1  ?Chloride Latest Ref Range: 98 - 111 mmol/L 100  ?CO2 Latest Ref Range: 22 - 32 mmol/L 29  ?Glucose Latest Ref Range: 70 - 99 mg/dL 104 (H)  ?    ?Creatinine Latest Ref Range: 0.61 - 1.24 mg/dL 1.63 (H)  ?Calcium Latest Ref Range: 8.9 - 10.3 mg/dL 9.3  ?Anion gap Latest Ref Range: 5 - 15  10  ?GFR, Estimated Latest Ref Range: >60 mL/min 49 (L)  ?Results for MAVEN, VARELAS (MRN [id number]) as of 11/12/2020 11:00 ? Ref. Range 09/07/2020 23:06  ?Hemoglobin Latest Ref Range: 13.0 - 17.0 g/dL 16.6  ? ? ?OV 02/08/2021 ? ?Subjective:  ?Patient ID: Erik Strong, male , DOB: 11-10-1960 , age 71 y.o. , MRN: [id number] , ADDRESS: [address] ?[place] [address] ?PCP Erik Strong, Erik Sa, Erik Strong ?Patient Care Team: ?Erik Strong, Erik Sa, Erik Strong as PCP - General (Family Medicine) ?

## 2022-01-25 NOTE — Addendum Note (Signed)
Addended by: Lorretta Harp on: 01/25/2022 11:09 AM ? ? Modules accepted: Orders ? ?

## 2022-01-28 LAB — QUANTIFERON-TB GOLD PLUS
Mitogen-NIL: >10 IU/mL
NIL: 0.03 IU/mL
QuantiFERON-TB Gold Plus: NEGATIVE
TB1-NIL: 0.03 IU/mL
TB2-NIL: 0.06 IU/mL

## 2022-02-03 ENCOUNTER — Other Ambulatory Visit: Payer: Self-pay

## 2022-02-03 MED ORDER — ROSUVASTATIN CALCIUM 20 MG PO TABS: 20.0000 mg | ORAL_TABLET | Freq: Every day | ORAL | 0 refills | Status: DC

## 2022-02-23 ENCOUNTER — Ambulatory Visit (INDEPENDENT_AMBULATORY_CARE_PROVIDER_SITE_OTHER)
Admission: RE | Admit: 2022-02-23 | Discharge: 2022-02-23 | Disposition: A | Discharge status: Home / Self Care | Payer: Medicare Other | Source: Ambulatory Visit | Attending: Internal Medicine | Admitting: Internal Medicine

## 2022-02-23 DIAGNOSIS — J84112 Idiopathic pulmonary fibrosis: Secondary | ICD-10-CM

## 2022-02-23 DIAGNOSIS — J432 Centrilobular emphysema: Secondary | ICD-10-CM | POA: Diagnosis not present

## 2022-02-23 DIAGNOSIS — I700 Atherosclerosis of aorta: Secondary | ICD-10-CM | POA: Diagnosis not present

## 2022-02-23 DIAGNOSIS — J479 Bronchiectasis, uncomplicated: Secondary | ICD-10-CM | POA: Diagnosis not present

## 2022-02-28 ENCOUNTER — Ambulatory Visit: Payer: Medicare Other | Admitting: Physician Assistant

## 2022-02-28 ENCOUNTER — Ambulatory Visit (INDEPENDENT_AMBULATORY_CARE_PROVIDER_SITE_OTHER): Payer: Medicare Other

## 2022-02-28 ENCOUNTER — Encounter: Payer: Self-pay | Admitting: Physician Assistant

## 2022-02-28 DIAGNOSIS — M25561 Pain in right knee: Secondary | ICD-10-CM

## 2022-02-28 DIAGNOSIS — M5442 Lumbago with sciatica, left side: Secondary | ICD-10-CM | POA: Diagnosis not present

## 2022-02-28 DIAGNOSIS — M5441 Lumbago with sciatica, right side: Secondary | ICD-10-CM

## 2022-02-28 MED ORDER — METHYLPREDNISOLONE 4 MG PO TABS: ORAL_TABLET | ORAL | 0 refills | Status: DC
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
## 2022-03-03 ENCOUNTER — Encounter: Payer: Self-pay | Admitting: Cardiology

## 2022-03-03 ENCOUNTER — Ambulatory Visit: Payer: Medicare Other | Admitting: Cardiology

## 2022-03-03 ENCOUNTER — Encounter: Payer: Self-pay | Admitting: Gastroenterology

## 2022-03-03 VITALS — BP 132/72 | HR 69 | Ht 73.0 in | Wt 230.4 lb

## 2022-03-03 DIAGNOSIS — K921 Melena: Secondary | ICD-10-CM

## 2022-03-03 DIAGNOSIS — I2510 Atherosclerotic heart disease of native coronary artery without angina pectoris: Secondary | ICD-10-CM

## 2022-03-03 DIAGNOSIS — E785 Hyperlipidemia, unspecified: Secondary | ICD-10-CM | POA: Diagnosis not present

## 2022-03-03 DIAGNOSIS — J849 Interstitial pulmonary disease, unspecified: Secondary | ICD-10-CM

## 2022-03-03 DIAGNOSIS — Z79899 Other long term (current) drug therapy: Secondary | ICD-10-CM | POA: Diagnosis not present

## 2022-03-03 LAB — CBC WITH DIFFERENTIAL/PLATELET
Basophils Absolute: 0.1 10*3/uL (ref 0.0–0.2)
Basos: 1 %
EOS (ABSOLUTE): 0.1 10*3/uL (ref 0.0–0.4)
Eos: 1 %
Hematocrit: 45.7 % (ref 37.5–51.0)
Hemoglobin: 15.2 g/dL (ref 13.0–17.7)
Lymphocytes Absolute: 4.3 10*3/uL — ABNORMAL HIGH (ref 0.7–3.1)
Lymphs: 29 %
MCH: 30.8 pg (ref 26.6–33.0)
MCHC: 33.3 g/dL (ref 31.5–35.7)
MCV: 93 fL (ref 79–97)
Monocytes Absolute: 0.9 10*3/uL (ref 0.1–0.9)
Monocytes: 6 %
Neutrophils Absolute: 9.5 10*3/uL — ABNORMAL HIGH (ref 1.4–7.0)
Neutrophils: 63 %
Platelets: 343 10*3/uL (ref 150–450)
RBC: 4.94 x10E6/uL (ref 4.14–5.80)
RDW: 13.5 % (ref 11.6–15.4)
WBC: 14.9 10*3/uL — ABNORMAL HIGH (ref 3.4–10.8)

## 2022-03-03 MED ORDER — ROSUVASTATIN CALCIUM 20 MG PO TABS: 20.0000 mg | ORAL_TABLET | Freq: Every day | ORAL | 3 refills | Status: DC
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
## 2022-03-03 NOTE — Patient Instructions (Signed)
Medication Instructions:   Your physician recommends that you continue on your current medications as directed. Please refer to the Current Medication list given to you today.  *If you need a refill on your cardiac medications before your next appointment, please call your pharmacy*   You have been referred to [place]   Lab Work:  1.) TODAY--CBC W DIFF  2.) IN ONE WEEK--LIPIDS--COME FASTING  If you have labs (blood work) drawn today and your tests are completely normal, you will receive your results only by: MyChart Message (if you have MyChart) OR A paper copy in the mail If you have any lab test that is abnormal or we need to change your treatment, we will call you to review the results.   Follow-Up: At [facility], you and your health needs are our priority.  As part of our continuing mission to provide you with exceptional heart care, we have created designated Provider Care Teams.  These Care Teams include your primary Cardiologist (physician) and Advanced Practice Providers (APPs -  Physician Assistants and Nurse Practitioners) who all work together to provide you with the care you need, when you need it.  We recommend signing up for the patient portal called "MyChart".  Sign up information is provided on this After Visit Summary.  MyChart is used to connect with patients for Virtual Visits (Telemedicine).  Patients are able to view lab/test results, encounter notes, upcoming appointments, etc.  Non-urgent messages can be sent to your provider as well.   To learn more about what you can do with MyChart, go to [email or web address].    Your next appointment:   6 month(s)  The format for your next appointment:   In Person  Provider:   DR. Johney Frame    Important Information About Sugar

## 2022-03-10 ENCOUNTER — Other Ambulatory Visit: Payer: Medicare Other

## 2022-03-10 DIAGNOSIS — Z79899 Other long term (current) drug therapy: Secondary | ICD-10-CM

## 2022-03-10 DIAGNOSIS — H5203 Hypermetropia, bilateral: Secondary | ICD-10-CM | POA: Diagnosis not present

## 2022-03-10 DIAGNOSIS — I2510 Atherosclerotic heart disease of native coronary artery without angina pectoris: Secondary | ICD-10-CM

## 2022-03-10 DIAGNOSIS — H2513 Age-related nuclear cataract, bilateral: Secondary | ICD-10-CM | POA: Diagnosis not present

## 2022-03-10 DIAGNOSIS — H524 Presbyopia: Secondary | ICD-10-CM | POA: Diagnosis not present

## 2022-03-10 DIAGNOSIS — E785 Hyperlipidemia, unspecified: Secondary | ICD-10-CM | POA: Diagnosis not present

## 2022-03-10 LAB — LIPID PANEL
Chol/HDL Ratio: 2.8 ratio (ref 0.0–5.0)
Cholesterol, Total: 138 mg/dL (ref 100–199)
HDL: 49 mg/dL (ref >39)
LDL Chol Calc (NIH): 67 mg/dL (ref 0–99)
Triglycerides: 125 mg/dL (ref 0–149)
VLDL Cholesterol Cal: 22 mg/dL (ref 5–40)

## 2022-03-14 ENCOUNTER — Encounter: Payer: Self-pay | Admitting: Gastroenterology

## 2022-03-14 ENCOUNTER — Ambulatory Visit: Payer: Medicare Other | Admitting: Gastroenterology

## 2022-03-14 VITALS — BP 116/80 | HR 82 | Ht 73.0 in | Wt 228.8 lb

## 2022-03-14 DIAGNOSIS — Z1212 Encounter for screening for malignant neoplasm of rectum: Secondary | ICD-10-CM | POA: Diagnosis not present

## 2022-03-14 DIAGNOSIS — K921 Melena: Secondary | ICD-10-CM

## 2022-03-14 DIAGNOSIS — Z1211 Encounter for screening for malignant neoplasm of colon: Secondary | ICD-10-CM

## 2022-03-14 DIAGNOSIS — M545 Low back pain, unspecified: Secondary | ICD-10-CM | POA: Diagnosis not present

## 2022-03-14 MED ORDER — NA SULFATE-K SULFATE-MG SULF 17.5-3.13-1.6 GM/177ML PO SOLN: 1.0000 | Freq: Once | ORAL | 0 refills | Status: AC
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
## 2022-03-28 ENCOUNTER — Encounter: Payer: Self-pay | Admitting: Physician Assistant

## 2022-03-28 ENCOUNTER — Ambulatory Visit: Payer: Medicare Other | Admitting: Physician Assistant

## 2022-03-28 DIAGNOSIS — M7661 Achilles tendinitis, right leg: Secondary | ICD-10-CM

## 2022-03-28 DIAGNOSIS — M5459 Other low back pain: Secondary | ICD-10-CM

## 2022-03-28 DIAGNOSIS — M545 Low back pain, unspecified: Secondary | ICD-10-CM

## 2022-03-28 NOTE — Addendum Note (Signed)
Addended by: Robyne Peers on: 03/28/2022 11:29 AM   Modules accepted: Orders

## 2022-03-28 NOTE — Progress Notes (Addendum)
HPI: [name] returns today for follow-up of his low back pain and numbness in his right knee.  He states that while he was on Medrol dose pack he really had no back pain.  Over the last week he has had 5 out of 10 pain at worst in the low back.  No real radicular symptoms down either leg.  He still complains of an area of numbness lateral aspect of the right knee but this is to touch only.  He does have some low back pain that awakens him.  Denies any saddle anesthesia like symptoms bowel bladder dysfunction.  He went to physical therapy and continues to do the home exercise program as taught by therapy feels as if this may be somewhat beneficial.  He is also complaining today of tearing sensation about his right ankle.  Especially whenever he is walking barefoot.  He has had no known injury.  Review of systems: See HPI otherwise negative or noncontributory.  Physical exam: General well-developed well-nourished male no acute distress ambulates without any assistive device. Psych: Alert and oriented x3 Vascular dorsal pedal pulses are 2+ and equal symmetric calf supple nontender bilaterally. Lower extremities: 5 out of 5 strength throughout the lower extremities against resistance.  Negative straight leg raise bilaterally.  Tenderness over the right lower lumbar para spinous region.  He is able to touch his toes.  He has limited extension lumbar spine.  Bilateral feet he has no tenderness over the medial tubercles of the calcaneus.  He has tenderness over the right Achilles insertion maximally at the lateral insertion.  No tenderness over the remainder of the right Achilles and no deficit appreciated.  Left Achilles nontender throughout.  Thompson's test is negative bilaterally.  Impression: Low back pain without radiculopathy Right insertional Achilles tendinitis  Plan: He will continue the home exercise program for his low back.  Given the fact that he continues to have low back pain which is 5 out of  10 and it does awaken him recommend MRI to evaluate for HNP as a source of his pain versus facet arthritic changes as source of his pain.  In regards to the Achilles tendinitis he shown gastrocsoleus stretching exercises.  9/16 inch heel lifts are placed in both shoes.  He is to avoid open back shoes.  Voltaren gel up to 2 g 4 times daily to the Achilles insertion area on the right.  Questions encouraged and answered at length

## 2022-04-07 ENCOUNTER — Ambulatory Visit
Admission: RE | Admit: 2022-04-07 | Discharge: 2022-04-07 | Disposition: A | Discharge status: Home / Self Care | Payer: Medicare Other | Source: Ambulatory Visit | Attending: Physician Assistant | Admitting: Physician Assistant

## 2022-04-07 DIAGNOSIS — M545 Low back pain, unspecified: Secondary | ICD-10-CM | POA: Diagnosis not present

## 2022-04-14 ENCOUNTER — Encounter: Payer: Self-pay | Admitting: Physician Assistant

## 2022-04-14 ENCOUNTER — Ambulatory Visit (INDEPENDENT_AMBULATORY_CARE_PROVIDER_SITE_OTHER): Payer: Medicare Other | Admitting: Physician Assistant

## 2022-04-14 DIAGNOSIS — M545 Low back pain, unspecified: Secondary | ICD-10-CM

## 2022-04-14 NOTE — Progress Notes (Signed)
  HPI: [name] returns today to go over the MRI of his lumbar spine.  He continues to take tramadol and Tylenol for pain.  Most of his pain is in the lateral aspect of the right leg describes as numbness in this area.  He continues to have low back pain. MRI images were reviewed with the patient and model was used to demonstrate the anatomy.  MRI dated 04/08/2022 shows no central spinal stenosis or neural foraminal.  Small central/right paracentral disc protrusion at L4-5 possibly contacts the right L5 nerve root.  Impression: Low back pain with numbness lateral aspect of right knee  Plan: Send him for an epidural steroid injection lumbar spine.  Have him follow-up with Dr. Ninfa Linden and myself 2 weeks after the injection to see how he is doing overall.  Questions were encouraged and answered.

## 2022-04-18 NOTE — Addendum Note (Signed)
Addended by: Robyne Peers on: 04/18/2022 08:28 AM   Modules accepted: Orders

## 2022-04-20 ENCOUNTER — Ambulatory Visit: Payer: Medicare Other | Admitting: Internal Medicine
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
## 2022-04-25 ENCOUNTER — Ambulatory Visit: Payer: Medicare Other | Admitting: Internal Medicine

## 2022-04-25 ENCOUNTER — Encounter: Payer: Self-pay | Admitting: Internal Medicine

## 2022-04-25 DIAGNOSIS — G4733 Obstructive sleep apnea (adult) (pediatric): Secondary | ICD-10-CM | POA: Diagnosis not present

## 2022-04-25 DIAGNOSIS — J449 Chronic obstructive pulmonary disease, unspecified: Secondary | ICD-10-CM

## 2022-04-25 DIAGNOSIS — Z9989 Dependence on other enabling machines and devices: Secondary | ICD-10-CM | POA: Diagnosis not present

## 2022-04-25 DIAGNOSIS — J84112 Idiopathic pulmonary fibrosis: Secondary | ICD-10-CM | POA: Diagnosis not present

## 2022-04-25 MED ORDER — DULERA 200-5 MCG/ACT IN AERO: INHALATION_SPRAY | RESPIRATORY_TRACT | 11 refills | Status: DC

## 2022-04-25 NOTE — Patient Instructions (Addendum)
Plan A = Automatic = Always=    Dulera 200  up to 2 puffs every 12 hours as needed   Plan B = Backup (to supplement plan A, not to replace it) Only use your albuterol inhaler as a rescue medication to be used if you can't catch your breath by resting or doing a relaxed purse lip breathing pattern.  - The less you use it, the better it will work when you need it. - Ok to use the inhaler up to 2 puffs  every 4 hours if you must but call for appointment if use goes up over your usual need - Don't leave home without it !!  (think of it like the spare tire for your car)     Try to stay off your back sleeping and I will refer you to sleep medicine here   Please schedule a follow up visit in  12  months but call sooner if needed

## 2022-04-26 ENCOUNTER — Ambulatory Visit: Payer: Self-pay

## 2022-04-26 ENCOUNTER — Ambulatory Visit: Payer: Medicare Other | Admitting: Physical Medicine and Rehabilitation

## 2022-04-26 ENCOUNTER — Encounter: Payer: Self-pay | Admitting: Physical Medicine and Rehabilitation

## 2022-04-26 ENCOUNTER — Encounter: Payer: Self-pay | Admitting: Internal Medicine

## 2022-04-26 VITALS — BP 121/76 | HR 79

## 2022-04-26 DIAGNOSIS — M5416 Radiculopathy, lumbar region: Secondary | ICD-10-CM

## 2022-04-26 DIAGNOSIS — G4733 Obstructive sleep apnea (adult) (pediatric): Secondary | ICD-10-CM | POA: Insufficient documentation

## 2022-04-26 MED ORDER — METHYLPREDNISOLONE ACETATE 80 MG/ML IJ SUSP
80.0000 mg | Freq: Once | INTRAMUSCULAR | Status: AC
  Administered 2022-04-26: 80 mg

## 2022-04-26 NOTE — Assessment & Plan Note (Signed)
Quit smoking 2018 then 25lb wt gain as of 1st pulm ov 05/09/2019  - 05/09/2019   Walked RA  2 laps @  approx 24f each @ fast pace  stopped due to  End of study, no sob, sats 96%  - 05/09/2019  After extensive coaching inhaler device,  effectiveness =    90% try symb 160 2bid ? ? Benefit but only using a few times a week - PFT's  10/21/2019  FEV1 3.75 (90 % ) ratio 0.79  p 0 % improvement from saba p 0 prior to study with DLCO  27.70 (88%) corrects to 3.89 (91%)  for alv volume and FV curve nl   > rec symb 160 15 min before heavy activity on alternating days and d/c if no benefit   Does a lot better when can access symbicort but can't afford it and now generic so no samples avail> try dulera 200 2bid   Re SABA :  I spent extra time with pt today reviewing appropriate use of albuterol for prn use on exertion with the following points: 1) saba is for relief of sob that does not improve by walking a slower pace or resting but rather if the pt does not improve after trying this first. 2) If the pt is convinced, as many are, that saba helps recover from activity faster then it's easy to tell if this is the case by re-challenging : ie stop, take the inhaler, then p 5 minutes try the exact same activity (intensity of workload) that just caused the symptoms and see if they are substantially diminished or not after saba 3) if there is an activity that reproducibly causes the symptoms, try the saba 15 min before the activity on alternate days   If in fact the saba really does help, then fine to continue to use it prn but advised may need to look closer at the maintenance regimen being used to achieve better control of airways disease with exertion.

## 2022-04-26 NOTE — Progress Notes (Signed)
Pt state lower back pain that travels down his right thigh and heel. Pt state walking, standing and laying down makes the pain worse. Pt state he uses heat and takes pain meds to help ease his pain.  Numeric Pain Rating Scale and Functional Assessment Average Pain 3   In the last MONTH (on 0-10 scale) has pain interfered with the following?  1. General activity like being  able to carry out your everyday physical activities such as walking, climbing stairs, carrying groceries, or moving a chair?  Rating(10)   +Driver, -BT, -Dye Allergies.

## 2022-04-26 NOTE — Assessment & Plan Note (Signed)
Seen first on CT abd 7//9/20  -  PFTs 10/21/2019 wnl with nl dlco - 04/20/2020 declines f/u CT due to insurance issues  - 09/25/2020   HRCT c/w UIP > referred to PF clinic> started on Esbriet 12/10/20  - 6/723 HRCT 1. Unchanged moderate pulmonary fibrosis in a pattern with apical to basal gradient, featuring irregular peripheral interstitial opacity, septal thickening, and substantial areas of subpleural bronchiolectasis and honeycombing at the bilateral lung bases. Findings are consistent with UIP per consensus guidelines: Diagnosis of Idiopathic Pulmonary Fibrosis: An Official ATS/ERS/JRS/ALAT Clinical Practice Guideline. Cope, Iss 5, [phone], May 20 2017. 2. Emphysema. 3. Coronary artery disease.  Continue esbriet/ monitor sats at peak ex

## 2022-04-26 NOTE — Assessment & Plan Note (Signed)
Referred to sleep medicine 04/25/2022   >>> in meantime rec he avoid sleeping on back/ avoiding driving if sleepy          Each maintenance medication was reviewed in detail including emphasizing most importantly the difference between maintenance and prns and under what circumstances the prns are to be triggered using an action plan format where appropriate.  Total time for H and P, chart review, counseling, reviewing hfa device(s) and generating customized AVS unique to this office visit / same day charting = 23 min

## 2022-04-26 NOTE — Patient Instructions (Signed)

## 2022-04-27 ENCOUNTER — Telehealth: Payer: Self-pay | Admitting: Internal Medicine

## 2022-04-27 ENCOUNTER — Ambulatory Visit: Payer: Self-pay

## 2022-04-27 NOTE — Patient Outreach (Signed)
  Care Coordination   04/27/2022 Name: Erik Strong MRN: [id number] DOB: 1961/06/05   Care Coordination Outreach Attempts:  An unsuccessful telephone outreach was attempted today to offer the patient information about available care coordination services as a benefit of their health plan.   Follow Up Plan:  Additional outreach attempts will be made to offer the patient care coordination information and services.   Encounter Outcome:  No Answer  Care Coordination Interventions Activated:  No   Care Coordination Interventions:  No, not indicated    [place] [facility] [phone]

## 2022-04-29 ENCOUNTER — Other Ambulatory Visit (HOSPITAL_COMMUNITY): Payer: Self-pay

## 2022-04-29 MED ORDER — BUDESONIDE-FORMOTEROL FUMARATE 160-4.5 MCG/ACT IN AERO: 2.0000 | INHALATION_SPRAY | Freq: Two times a day (BID) | RESPIRATORY_TRACT | 6 refills | Status: DC

## 2022-04-29 NOTE — Telephone Encounter (Signed)
Erik Strong, CPhT  You 36 minutes ago (2:37 PM)    Test claims for this patients insurance produce the following copay results for ICS/LABA products:   Advair Diskus - $47  Advair HFA - $47  Breo - $47  Dulera - $100  Symbicort - $47    Dr. Melvyn Novas, please advise on this for pt.

## 2022-04-29 NOTE — Telephone Encounter (Signed)
Symbicort 160 Take 2 puffs first thing in am and then another 2 puffs about 12 hours later.    

## 2022-04-29 NOTE — Telephone Encounter (Signed)
Called and spoke with patient. He verbalized understanding of Symbicort. Confirmed his pharmacy. RX has been sent.   Nothing further needed at time of call.

## 2022-04-29 NOTE — Telephone Encounter (Signed)
Called and spoke with pt who states dulera was too expensive for him which was going to be over $300. Pt said that he was unable to use the discount card due to insurance.  Pt wants to know if there is something cheaper that could be prescribed. Routing to prior auth team to see which inhalers are more affordable.

## 2022-05-04 NOTE — Procedures (Signed)
Lumbosacral Transforaminal Epidural Steroid Injection - Sub-Pedicular Approach with Fluoroscopic Guidance  Patient: Erik Strong      Date of Birth: Aug 15, 1961 MRN: [id number] PCP: Alroy Dust, L.[name], MD      Visit Date: 04/26/2022   Universal Protocol:    Date/Time: 04/26/2022  Consent Given By: the patient  Position: PRONE  Additional Comments: Vital signs were monitored before and after the procedure. Patient was prepped and draped in the usual sterile fashion. The correct patient, procedure, and site was verified.   Injection Procedure Details:   Procedure diagnoses: Lumbar radiculopathy [M54.16]    Meds Administered:  Meds ordered this encounter  Medications   methylPREDNISolone acetate (DEPO-MEDROL) injection 80 mg    Laterality: Right  Location/Site: L5  Needle:5.0 in., 22 ga.  Short bevel or Quincke spinal needle  Needle Placement: Transforaminal  Findings:    -Comments: Excellent flow of contrast along the nerve, nerve root and into the epidural space.  Procedure Details: After squaring off the end-plates to get a true AP view, the C-arm was positioned so that an oblique view of the foramen as noted above was visualized. The target area is just inferior to the "nose of the scotty dog" or sub pedicular. The soft tissues overlying this structure were infiltrated with 2-3 ml. of 1% Lidocaine without Epinephrine.  The spinal needle was inserted toward the target using a "trajectory" view along the fluoroscope beam.  Under AP and lateral visualization, the needle was advanced so it did not puncture dura and was located close the 6 O'Clock position of the pedical in AP tracterory. Biplanar projections were used to confirm position. Aspiration was confirmed to be negative for CSF and/or blood. A 1-2 ml. volume of Isovue-250 was injected and flow of contrast was noted at each level. Radiographs were obtained for documentation purposes.   After attaining the desired flow  of contrast documented above, a 0.5 to 1.0 ml test dose of 0.25% Marcaine was injected into each respective transforaminal space.  The patient was observed for 90 seconds post injection.  After no sensory deficits were reported, and normal lower extremity motor function was noted,   the above injectate was administered so that equal amounts of the injectate were placed at each foramen (level) into the transforaminal epidural space.   Additional Comments:  No complications occurred Dressing: 2 x 2 sterile gauze and Band-Aid    Post-procedure details: Patient was observed during the procedure. Post-procedure instructions were reviewed.  Patient left the clinic in stable condition.

## 2022-05-04 NOTE — Progress Notes (Signed)
Erik Strong - 61 y.o. male MRN [id number]  Date of birth: 03-07-1961  Office Visit Note: Visit Date: 04/26/2022 PCP: Alroy Dust, L.[name], MD Referred by: Alroy Dust, L.[name], MD  Subjective: Chief Complaint  Patient presents with   Lower Back - Pain   Right Thigh - Pain   Right Foot - Pain   HPI:  Erik Strong is a 61 y.o. male who comes in today at the request of Benita Stabile, PA-C for planned Right L5-S1 Lumbar Transforaminal epidural steroid injection with fluoroscopic guidance.  The patient has failed conservative care including home exercise, medications, time and activity modification.  This injection will be diagnostic and hopefully therapeutic.  Please see requesting physician notes for further details and justification.   ROS Otherwise per HPI.  Assessment & Plan: Visit Diagnoses:    ICD-10-CM   1. Lumbar radiculopathy  M54.16 XR C-ARM NO REPORT    Epidural Steroid injection    methylPREDNISolone acetate (DEPO-MEDROL) injection 80 mg      Plan: No additional findings.   Meds & Orders:  Meds ordered this encounter  Medications   methylPREDNISolone acetate (DEPO-MEDROL) injection 80 mg    Orders Placed This Encounter  Procedures   XR C-ARM NO REPORT   Epidural Steroid injection    Follow-up: Return for visit to requesting provider as needed.   Procedures: No procedures performed  Lumbosacral Transforaminal Epidural Steroid Injection - Sub-Pedicular Approach with Fluoroscopic Guidance  Patient: Erik Strong      Date of Birth: [birth year] MRN: [id number] PCP: Alroy Dust, L.[name], MD      Visit Date: 04/26/2022   Universal Protocol:    Date/Time: 04/26/2022  Consent Given By: the patient  Position: PRONE  Additional Comments: Vital signs were monitored before and after the procedure. Patient was prepped and draped in the usual sterile fashion. The correct patient, procedure, and site was verified.   Injection Procedure Details:   Procedure diagnoses:  Lumbar radiculopathy [M54.16]    Meds Administered:  Meds ordered this encounter  Medications   methylPREDNISolone acetate (DEPO-MEDROL) injection 80 mg    Laterality: Right  Location/Site: L5  Needle:5.0 in., 22 ga.  Short bevel or Quincke spinal needle  Needle Placement: Transforaminal  Findings:    -Comments: Excellent flow of contrast along the nerve, nerve root and into the epidural space.  Procedure Details: After squaring off the end-plates to get a true AP view, the C-arm was positioned so that an oblique view of the foramen as noted above was visualized. The target area is just inferior to the "nose of the scotty dog" or sub pedicular. The soft tissues overlying this structure were infiltrated with 2-3 ml. of 1% Lidocaine without Epinephrine.  The spinal needle was inserted toward the target using a "trajectory" view along the fluoroscope beam.  Under AP and lateral visualization, the needle was advanced so it did not puncture dura and was located close the 6 O'Clock position of the pedical in AP tracterory. Biplanar projections were used to confirm position. Aspiration was confirmed to be negative for CSF and/or blood. A 1-2 ml. volume of Isovue-250 was injected and flow of contrast was noted at each level. Radiographs were obtained for documentation purposes.   After attaining the desired flow of contrast documented above, a 0.5 to 1.0 ml test dose of 0.25% Marcaine was injected into each respective transforaminal space.  The patient was observed for 90 seconds post injection.  After no sensory deficits were reported, and normal lower extremity  motor function was noted,   the above injectate was administered so that equal amounts of the injectate were placed at each foramen (level) into the transforaminal epidural space.   Additional Comments:  No complications occurred Dressing: 2 x 2 sterile gauze and Band-Aid    Post-procedure details: Patient was observed during the  procedure. Post-procedure instructions were reviewed.  Patient left the clinic in stable condition.    Clinical History: No specialty comments available.     Objective:  VS:  HT:    WT:   BMI:     BP:121/76  HR:79bpm  TEMP: ( )  RESP:  Physical Exam Vitals and nursing note reviewed.  Constitutional:      General: He is not in acute distress.    Appearance: Normal appearance. He is not ill-appearing.  HENT:     Head: Normocephalic and atraumatic.     Right Ear: External ear normal.     Left Ear: External ear normal.     Nose: No congestion.  Eyes:     Extraocular Movements: Extraocular movements intact.  Cardiovascular:     Rate and Rhythm: Normal rate.     Pulses: Normal pulses.  Pulmonary:     Effort: Pulmonary effort is normal. No respiratory distress.  Abdominal:     General: There is no distension.     Palpations: Abdomen is soft.  Musculoskeletal:        General: No tenderness or signs of injury.     Cervical back: Neck supple.     Right lower leg: No edema.     Left lower leg: No edema.     Comments: Patient has good distal strength without clonus.  Skin:    Findings: No erythema or rash.  Neurological:     General: No focal deficit present.     Mental Status: He is alert and oriented to person, place, and time.     Sensory: No sensory deficit.     Motor: No weakness or abnormal muscle tone.     Coordination: Coordination normal.  Psychiatric:        Mood and Affect: Mood normal.        Behavior: Behavior normal.      Imaging: No results found.

## 2022-05-06 ENCOUNTER — Ambulatory Visit: Payer: Medicare Other | Admitting: Internal Medicine

## 2022-05-06 ENCOUNTER — Encounter: Payer: Self-pay | Admitting: Internal Medicine

## 2022-05-06 ENCOUNTER — Ambulatory Visit (INDEPENDENT_AMBULATORY_CARE_PROVIDER_SITE_OTHER): Payer: Medicare Other | Admitting: Internal Medicine

## 2022-05-06 VITALS — BP 120/78 | HR 65 | Temp 98.4°F | Ht 73.5 in | Wt 232.0 lb

## 2022-05-06 DIAGNOSIS — R748 Abnormal levels of other serum enzymes: Secondary | ICD-10-CM | POA: Diagnosis not present

## 2022-05-06 DIAGNOSIS — J84112 Idiopathic pulmonary fibrosis: Secondary | ICD-10-CM | POA: Diagnosis not present

## 2022-05-06 DIAGNOSIS — J449 Chronic obstructive pulmonary disease, unspecified: Secondary | ICD-10-CM

## 2022-05-06 DIAGNOSIS — R0602 Shortness of breath: Secondary | ICD-10-CM

## 2022-05-06 DIAGNOSIS — Z5181 Encounter for therapeutic drug level monitoring: Secondary | ICD-10-CM | POA: Diagnosis not present

## 2022-05-06 LAB — PULMONARY FUNCTION TEST
DL/VA % pred: 83 %
DL/VA: 3.51 ml/min/mmHg/L
DLCO cor % pred: 66 %
DLCO cor: 20.68 ml/min/mmHg
DLCO unc % pred: 66 %
DLCO unc: 20.68 ml/min/mmHg
FEF 25-75 Pre: 3.34 L/sec
FEF2575-%Pred-Pre: 99 %
FEV1-%Pred-Pre: 85 %
FEV1-Pre: 3.5 L
FEV1FVC-%Pred-Pre: 104 %
FEV6-%Pred-Pre: 85 %
FEV6-Pre: 4.41 L
FEV6FVC-%Pred-Pre: 104 %
FVC-%Pred-Pre: 81 %
FVC-Pre: 4.41 L
Pre FEV1/FVC ratio: 79 %
Pre FEV6/FVC Ratio: 100 %

## 2022-05-06 LAB — CBC WITH DIFFERENTIAL/PLATELET
Basophils Absolute: 0.1 10*3/uL (ref 0.0–0.1)
Basophils Relative: 0.8 % (ref 0.0–3.0)
Eosinophils Absolute: 0.1 10*3/uL (ref 0.0–0.7)
Eosinophils Relative: 0.8 % (ref 0.0–5.0)
HCT: 45.6 % (ref 39.0–52.0)
Hemoglobin: 15.1 g/dL (ref 13.0–17.0)
Lymphocytes Relative: 32.6 % (ref 12.0–46.0)
Lymphs Abs: 2.7 10*3/uL (ref 0.7–4.0)
MCHC: 33.1 g/dL (ref 30.0–36.0)
MCV: 93.8 fl (ref 78.0–100.0)
Monocytes Absolute: 0.6 10*3/uL (ref 0.1–1.0)
Monocytes Relative: 7.4 % (ref 3.0–12.0)
Neutro Abs: 4.8 10*3/uL (ref 1.4–7.7)
Neutrophils Relative %: 58.4 % (ref 43.0–77.0)
Platelets: 324 10*3/uL (ref 150.0–400.0)
RBC: 4.86 Mil/uL (ref 4.22–5.81)
RDW: 14.1 % (ref 11.5–15.5)
WBC: 8.3 10*3/uL (ref 4.0–10.5)

## 2022-05-06 LAB — BASIC METABOLIC PANEL
BUN: 17 mg/dL (ref 6–23)
CO2: 30 mEq/L (ref 19–32)
Calcium: 9.3 mg/dL (ref 8.4–10.5)
Chloride: 103 mEq/L (ref 96–112)
Creatinine, Ser: 1.25 mg/dL (ref 0.40–1.50)
GFR: 62.88 mL/min (ref >60.00)
Glucose, Bld: 103 mg/dL — ABNORMAL HIGH (ref 70–99)
Potassium: 4.1 mEq/L (ref 3.5–5.1)
Sodium: 137 mEq/L (ref 135–145)

## 2022-05-06 LAB — HEPATIC FUNCTION PANEL
ALT: 44 U/L (ref 0–53)
AST: 25 U/L (ref 0–37)
Albumin: 4.3 g/dL (ref 3.5–5.2)
Alkaline Phosphatase: 70 U/L (ref 39–117)
Bilirubin, Direct: 0.1 mg/dL (ref 0.0–0.3)
Total Bilirubin: 0.6 mg/dL (ref 0.2–1.2)
Total Protein: 7.4 g/dL (ref 6.0–8.3)

## 2022-05-06 NOTE — Progress Notes (Signed)
Spirometry/DLCO performed today. 

## 2022-05-06 NOTE — Patient Instructions (Addendum)
#  Pulmnary fibrosis - IPF  -clinically stable - handling esbriet well   Plan  - continue esbriet - check cbc, bmet, LFT 05/06/2022 - glad you are interested in clnical trials - meet Lauren from [facility] to take consent form for research study - [employer] SQ injectio and [employer] ORal    EMphysema - alpha 1 MM   -- stable disease - ran out of symbicort for months but now back on it since 05/05/22  Plan - continue symbicort scheduled with albuterol as neede   Elevated CK - noted FEb 2022  ? Drug related  Plan - recheck CK 05/06/2022   Followup  - 6-8 weeks  with Dr Chase Caller - 30 min slot to review test results

## 2022-05-06 NOTE — Patient Instructions (Signed)
Spirometry/DLCO performed today. 

## 2022-05-06 NOTE — Progress Notes (Addendum)
OV 11/12/2020  Subjective:  Patient ID: Erik Strong, male , DOB: [birth year] , age 61 y.o. , MRN: [id number] , ADDRESS: [place] [address] PCP Alroy Dust, L.[name], MD Patient Care Team: Alroy Dust, Carlean Jews.[name], MD as PCP - General (Family Medicine)  This Provider for this visit: Treatment Team:  Attending Provider: Brand Males, MD    11/12/2020 -   Chief Complaint  Patient presents with   New Patient (Initial Visit)    Doing ok, winded at times     HPI Erik Strong 62 y.o. -referred by Dr. Christinia Gully to the ILD center because of discovery of pulmonary fibrosis he also has associated emphysema.  He says he has been disabled because of back issues.  He says he has severe ADD and sometimes he will have to search for key for 2 hours.  He says he can get overwhelmed easily and it might take multiple visits for him to fully grasp disease discussion and therapeutic management plan and prognosis.  [facility] Comprehensive ILD Questionnaire  Symptoms: Insidious onset of shortness of breath gradually getting worse for the last several years.  Episodic dyspnea present severity scale below.  He does have difficulty keeping up with others of his age.  He does have some arthralgia.  He does have cough he coughs at night he brings up some phlegm.  It is whitish-green.  He does clear his throat he does feel this a tickle in the back of the throat occasional wheezing.  No nausea no vomiting no diarrhea.    Past Medical History : As below but no collagen vascular disease or vasculitis.  Does have chronic kidney disease no history of blood clots   ROS:   Does have fatigue arthralgia he does have cracked skin in his fingers.  Does have arthralgia.  Also has daytime somnolence.  He is not able to use his CPAP no recurrent fever no weight loss.  Does have  FAMILY HISTORY of LUNG DISEASE:   Denies   EXPOSURE HISTORY: Between 1978 and 2018 he smoked 30 cigarettes/day  and then quit.  Does not smoke cigars no smoking pipes no vaping.  Current marijuana user.  In 2003 use some cocaine.  HOME and HOBBY DETAILS : Single-family home in the [place] setting.  He is lived in the home for 50 years.  The home itself is 06/06/1961 built and is 60 years old.  There is dampness in the basement he does do some occasional yard work and Optician, dispensing but otherwise no exposure to mold organic antigens he does have cats and dogs   OCCUPATIONAL HISTORY (122 questions) : He has done work with damp air-conditioned spaces.  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  Done pest control work.  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  Done carpentry done wood trimming.  Done oil heating done asbestos work done [employer] work.  Done epoxy reasons.  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  Done plastic work.  Done insulation work.  Done metal grinding done machine operating done industrial work.  Done lack ring done spray painting.  Done waterproofing done 5 working.  Done flooring.  Done metal legs.  Done Nurse, learning disability.  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  Narrative & Impression  CLINICAL DATA:  61 year old male with history of asbestos exposure. Intermittent wheezing. Former smoker (quit 4 years ago). Suspected interstitial lung disease.   EXAM: CT CHEST WITHOUT CONTRAST   TECHNIQUE: Multidetector CT imaging of the chest was performed following the standard protocol without intravenous contrast. High resolution imaging of the lungs, as well as inspiratory and expiratory imaging, was performed.   COMPARISON:  No priors.    FINDINGS: Cardiovascular: Heart size is normal. There is no significant pericardial fluid, thickening or pericardial calcification. There is aortic atherosclerosis, as well as atherosclerosis of the great vessels of the mediastinum and the coronary arteries, including calcified atherosclerotic plaque in the left anterior descending and right coronary arteries.   Mediastinum/Nodes: No pathologically enlarged mediastinal or hilar lymph nodes. Please note that accurate exclusion of hilar adenopathy is limited on noncontrast CT scans. Esophagus is unremarkable in appearance. No axillary lymphadenopathy.   Lungs/Pleura: High-resolution images demonstrate patchy regions of ground-glass attenuation, septal thickening, subpleural reticulation, traction bronchiectasis and frank honeycombing. Findings have a craniocaudal gradient. Inspiratory and expiratory imaging is unremarkable. No acute consolidative airspace disease. No pleural effusions. No definite suspicious appearing pulmonary nodules or masses are noted. Mild diffuse bronchial wall thickening with mild centrilobular and paraseptal emphysema.   Upper Abdomen: Low-attenuation adrenal nodules bilaterally measuring 2.9 x 1.9 cm on the right (6 HU) and 2.0 x 1.6 cm on the left (3 HU), compatible with benign adrenal adenomas. Aortic atherosclerosis.   Musculoskeletal: There are no aggressive appearing lytic or blastic lesions noted in the visualized portions of the skeleton.   IMPRESSION: 1. 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 2. There is also mild diffuse bronchial wall thickening with mild centrilobular and paraseptal emphysema; imaging findings suggestive of underlying COPD. 3. Aortic atherosclerosis, in addition to left anterior descending and right coronary artery disease. Please note  that although the presence of coronary artery calcium documents the presence of coronary artery disease, the severity of this disease and any potential stenosis cannot be assessed on this non-gated CT examination. Assessment for potential risk factor modification, dietary therapy or pharmacologic therapy may be warranted, if clinically indicated. 4. Bilateral adrenal adenomas, as above.   Aortic Atherosclerosis (ICD10-I70.0) and Emphysema (ICD10-J43.9).     Electronically Signed   By: Vinnie Langton M.D.   On: 09/25/2020 14:33      PFT  PFT Results Latest Ref Rng & Units 10/21/2019  FVC-Pre L 4.59  FVC-Predicted Pre % 84  FVC-Post L 4.72  FVC-Predicted Post % 86  Pre FEV1/FVC % % 82  Post FEV1/FCV % % 79  FEV1-Pre L 3.76  FEV1-Predicted Pre % 90  FEV1-Post L 3.75  DLCO uncorrected ml/min/mmHg 27.70  DLCO UNC% % 88  DLCO corrected ml/min/mmHg 27.11  DLCO COR %Predicted % 86  DLVA Predicted % 91  TLC L 7.98  TLC % Predicted % 103  RV % Predicted % 140    Results for ERI, PLATTEN (MRN [id number]) as of 11/12/2020 11:00  Ref. Range 09/07/2020 23:06  Creatinine Latest Ref Range: 0.61 - 1.24 mg/dL 1.63 (H)  Results for MARKEESE, BOYAJIAN (MRN [id number]) as of 11/12/2020 11:00  Ref. Range 09/07/2020 31:49  BASIC METABOLIC PANEL Unknown Rpt (A)  Sodium Latest Ref Range: 135 -  145 mmol/L 139  Potassium Latest Ref Range: 3.5 - 5.1 mmol/L 4.1  Chloride Latest Ref Range: 98 - 111 mmol/L 100  CO2 Latest Ref Range: 22 - 32 mmol/L 29  Glucose Latest Ref Range: 70 - 99 mg/dL 104 (H)      Creatinine Latest Ref Range: 0.61 - 1.24 mg/dL 1.63 (H)  Calcium Latest Ref Range: 8.9 - 10.3 mg/dL 9.3  Anion gap Latest Ref Range: 5 - 15  10  GFR, Estimated Latest Ref Range: >60 mL/min 49 (L)  Results for OSUALDO, HANSELL (MRN [id number]) as of 11/12/2020 11:00  Ref. Range 09/07/2020 23:06  Hemoglobin Latest Ref Range: 13.0 - 17.0 g/dL 16.6    OV 02/08/2021  Subjective:  Patient ID: Erik Strong, male , DOB: 1961-03-31 , age 61 y.o. , MRN: [id number] , ADDRESS: [place] [address] PCP Alroy Dust, L.[name], MD Patient Care Team: Alroy Dust, Carlean Jews.[name], MD as PCP - General (Family Medicine)  This Provider for this visit: Treatment Team:  Attending Provider: Brand Males, MD    02/08/2021 -   Chief Complaint  Patient presents with   Follow-up    Pt states he has been doing okay since last visit. States he still becomes SOB with exertion.   Follow-up idiopathic pulmonary fibrosis `- Diagnoses given 11/13/2020 [myositis panel negative but CK and aldolase slightly elevated]  -Clinical diagnosis based on male gender, classic UIP and serology only being trace positive although age only 69 and prior smoking history  - Last PFT February 2021 -Last high-resolution CT January 2022 -Started pirfenidone end of March 2022   Associated emphysema alpha-1 MM  -On Symbicort  Associated significant attention deficit disorder  Normal cardiac stress test March 2022  HPI Erik Strong 61 y.o. -returns for follow-up.  He is now on pirfenidone since end of March 2022.  He saying he is tolerating it fine except for occasional nausea that is very mild.  There are no real problems.  Overall he is feeling good.  Today he is wearing sleeveless T-shirt and shorts.  He has a history of skin cancer.  He says he is not putting or applying sunscreen although he notes that he needs to.  He is now on pirfenidone as well.  I cautioned him about this.  His last liver function test was in February 2022.  He will have his liver function test today and he is agreed.  His symptom score and walking desaturation test appears stable.  His last pulmonary function test was over a year ago and he is willing to have this scheduled prior to the next visit.   OV 01/25/2022  Subjective:  Patient ID: Erik Strong, male , DOB: 1961/06/07 , age 58 y.o. , MRN: [id number] , ADDRESS: [place] [id number] PCP Alroy Dust, L.[name], MD Patient Care Team: Alroy Dust, Carlean Jews.[name], MD as PCP - General (Family Medicine)  This Provider for this visit: Treatment Team:  Attending Provider: Brand Males, MD    01/25/2022 -   Chief Complaint  Patient presents with   Follow-up    Pt states he has been doing okay since last visit. States his breathing is about the same.    HPI Erik Strong 61 y.o. -returns for follow-up.  It has been a year since I last saw him after that he saw Dr. Melvyn Novas in August 2022 and not followed up.  He continues his pirfenidone although some 7 weeks ago he started running  out of it and started taking it only 1 or 2 tablets a day.  And then was completely out of it for 1 week.  Now for the last 2 weeks after getting his insurance paperwork sorted out he has been taking it at full dose.  He is tolerating it well.  There is no sunburn or any other side effect.  The main issue is that he does have nausea but no vomiting.  The nausea is mild.  He has no complaints overall he feels stable.  His last lab work was last year.  His last pulmonary function test was 2 years ago last CT scan of the chest was 1 year ago.  He does not smoke.  He denies any new health issues no changes in medications.  He is on disability for his hip and his knee does bother him.  However he does think he can walk a 6-minute walk test.   His symptoms: Walking desaturation appears stable over time.Marland Kitchen  His weight is also stable.  He does have OSA but unable to tolerate     OV 05/06/2022  Subjective:  Patient ID: Erik Strong, male , DOB: September 29, 1960 , age 32 y.o. , MRN: [id number] , ADDRESS: [place] [address] PCP Alroy Dust, L.[name], MD Patient Care Team: Alroy Dust, Carlean Jews.[name], MD as PCP - General (Family Medicine)  This Provider for this visit: Treatment Team:  Attending Provider: Brand Males, MD  Follow-up idiopathic pulmonary fibrosis `- Diagnoses  given 11/13/2020 [myositis panel negative but CK and aldolase slightly elevated]  -Clinical diagnosis based on male gender, classic UIP and serology only being trace positive although age only 56 and prior smoking history  - Last PFT February 2021 -Last high-resolution CT January 2022 -Started pirfenidone end of March 2022   Associated emphysema alpha-1 MM  -On Symbicort  Associated significant attention deficit disorder  OSA unable to tolerate CPAP  Normal cardiac stress test March 2022. Last echo April 2022   05/06/2022 -   Chief Complaint  Patient presents with   Follow-up    PFT performed today.  Pt states he has been doing okay since last visit. States his breathing has been doing okay.     HPI Erik Strong 61 y.o. -returns for follow-up.  At this point in time he is stable on pirfenidone.  His symptoms are minimal.  He does say that when he goes out and son there is some burning but there is no rash.  He has noticed that niacin in the past.  He was wondering if it was because of pirfenidone.  I confirmed the same.  But he does use sunscreen and this brings down the bone intensity and definitely no skin rash at any time or erythema.  He has no new complaints.  He had pulmonary function test shows slight reduction that is probably not clinically significant.  He states that he ran out of Symbicort due to insurance issues few months ago and just ordered it yesterday.  He suspects is because of that.  His symptom score is actually better.  He had high-resolution CT chest in summer 2023 and is stable.  Overall he believes he is stable.   We discussed clinical trials as a care option and coverered the following    1. Scientific Purpose  Clinical research is designed to produce generalizable knowledge and to answer questions about the safety and efficacy of intervention(s) under study in order to determine whether or not they  may be useful for the care of future patients.  2. Study  Procedures  Participation in a trial may involve procedures or tests, in addition to the intervention(s) under study, that are intended only or primarily to generate scientific knowledge and that are otherwise not necessary for patient care.   3. Uncertainty  For intervention(s) under study in clinical research, there often is less knowledge and more uncertainty about the risks and benefits to a population of trial participants than there is when a doctor offers a patient standard interventions.   4. Adherence to Protocol  Administration of the intervention(s) under study is typically based on a strict protocol with defined dose, scheduling, and use or avoidance of concurrent medications, compared to administration of standard interventions.  5. Clinician as Investigator  Clinicians who are in health care settings provide treatment; in a clinical trial setting, they are also investigating safety and efficacy of an intervention. In otherwise your doctor or nurse practitioner can be wearing 2 hats - one as care giver another as Company secretary  6. Patient as Visual merchandiser Subject  Patients participating in research trials are research subjects or volunteers. In other words participating in research is 100% voluntary and at one's own free weill. The decision to participate or not participate will NOT affect patient care and the doctor-patient relationship in any way   SYMPTOM SCALE - ILD 11/12/2020  02/08/2021 Now on esbriet 225# 01/25/2022 231#.  Pirfenidone present 05/06/2022 232#  = esbriet  O2 use ra ts ra ra  Shortness of Breath 0 -> 5 scale with 5 being worst (score 6 If unable to do)     At rest 1 0/5 1 0  Simple tasks - showers, clothes change, eating, shaving '2 1 2 '$ 0.5  Household (dishes, doing bed, laundry) '2 2 3 '$ 0/5  Shopping '3 3 2 1  '$ Walking level at own pace '1 2 2 '$ 1.5  Walking up Stairs '3 3 3 '$ 2.5  Total (30-36) Dyspnea Score 12 11.'5 13 6  '$ How bad is your cough?  2 1.'5 2 1  '$ How bad is your fatigue '2 3 3 2  '$ How bad is nausea 0 0.'5 2 1  '$ How bad is vomiting?  0 0 0 0  How bad is diarrhea? 0 0 0 0  How bad is anxiety? '2 1 2 2  '$ How bad is depression 1 0 0 0  6    Simple office walk 185 feet x  3 laps goal with forehead probe 11/12/2020  /yf  01/25/2022   O2 used ra ra ra  Number laps completed '3 3 3  '$ Comments about pace Slow pace Mod pae   Resting Pulse Ox/HR 96% and 71/min 100% and 73 100% ad HR 59  Final Pulse Ox/HR 95% and 85/min 97% and 93/,o 99% and HR 76  Desaturated </= 88% no no   Desaturated <= 3% points no Yes, 3   Got Tachycardic >/= 90/min no Myes   Symptoms at end of test Some dsypnea last lap Mild-mod dyspnea   Miscellaneous comments x x stable    CT Chest data  No results found.    PFT     Latest Ref Rng & Units 05/06/2022    1:59 PM 10/21/2019    1:53 PM  PFT Results  FVC-Pre L 4.41  P 4.59   FVC-Predicted Pre % 81  P 84   FVC-Post L  4.72   FVC-Predicted Post %  86  Pre FEV1/FVC % % 79  P 82   Post FEV1/FCV % %  79   FEV1-Pre L 3.50  P 3.76   FEV1-Predicted Pre % 85  P 90   FEV1-Post L  3.75   DLCO uncorrected ml/min/mmHg 20.68  P 27.70   DLCO UNC% % 66  P 88   DLCO corrected ml/min/mmHg 20.68  P 27.11   DLCO COR %Predicted % 66  P 86   DLVA Predicted % 83  P 91   TLC L  7.98   TLC % Predicted %  103   RV % Predicted %  140     P Preliminary result    HRCT 02/23/22  IMPRESSION: 1. Unchanged moderate pulmonary fibrosis in a pattern with apical to basal gradient, featuring irregular peripheral interstitial opacity, septal thickening, and substantial areas of subpleural bronchiolectasis and honeycombing at the bilateral lung bases. Findings are consistent with UIP per consensus guidelines: Diagnosis of Idiopathic Pulmonary Fibrosis: An Official ATS/ERS/JRS/ALAT Clinical Practice Guideline. [place], Iss 5, [phone], May 20 2017. 2. Emphysema. 3. Coronary artery disease.    Aortic Atherosclerosis (ICD10-I70.0) and Emphysema (ICD10-J43.9).     Electronically Signed   By: Delanna Ahmadi M.D.   On: 02/24/2022 10:06   has a past medical history of ADD (attention deficit disorder), Anxiety, Arthritis, Chronic pain, History of kidney stones, and Temporal head injury.   reports that he quit smoking about 4 years ago. His smoking use included cigarettes. He started smoking about 44 years ago. He has a 84.00 pack-year smoking history. He has never used smokeless tobacco.  Past Surgical History:  Procedure Laterality Date   HIP SURGERY     I & D EXTREMITY Left 09/02/2019   Procedure: IRRIGATION AND DEBRIDEMENT HAND;  Surgeon: Milly Jakob, MD;  Location: [facility];  Service: Orthopedics;  Laterality: Left;   TOOTH EXTRACTION Bilateral 12/30/2016   Procedure: EXTRACTION MOLARS;  Surgeon: Diona Browner, DDS;  Location: [facility];  Service: Oral Surgery;  Laterality: Bilateral;  Extraction of teeth four, five, twenty-six, thirty; removal of bilateral mandibular lingual tori    Allergies  Allergen Reactions   Other Other (See Comments)   Codeine Nausea And Vomiting   Macrodantin [Nitrofurantoin Macrocrystal] Rash   Shellfish Allergy Nausea And Vomiting    Immunization History  Administered Date(s) Administered   Influenza Split 05/10/2018, 05/04/2019, 09/08/2020   Influenza,inj,Quad PF,6+ Mos 09/09/2016, 06/15/2017, 05/10/2018, 05/04/2019   PFIZER(Purple Top)SARS-COV-2 Vaccination 12/16/2019, 01/08/2020, 09/08/2020   PNEUMOCOCCAL CONJUGATE-20 11/22/2021   Td 07/10/2006   Tdap 05/14/2012, 12/03/2014   Zoster Recombinat (Shingrix) 05/08/2019   Zoster, Live 05/08/2019, 05/20/2020    Family History  Problem Relation Age of Onset   Prostate cancer Father    CAD Maternal Uncle      Current Outpatient Medications:    acetaminophen (TYLENOL) 325 MG tablet, Take 2 tablets (650 mg total) by mouth every 6 (six) hours., Disp: , Rfl:    albuterol (VENTOLIN HFA) 108 (90  Base) MCG/ACT inhaler, TAKE 2 PUFFS BY MOUTH EVERY 6 HOURS AS NEEDED FOR WHEEZE OR SHORTNESS OF BREATH, Disp: 6.7 each, Rfl: 3   ALPRAZolam (XANAX) 0.5 MG tablet, Take 0.5 mg by mouth at bedtime., Disp: , Rfl:    amphetamine-dextroamphetamine (ADDERALL) 15 MG tablet, take 2 tablets by mouth in the morning., Disp: , Rfl:    aspirin EC 81 MG tablet, Take 1 tablet (81 mg total) by mouth daily. Swallow whole., Disp: 90 tablet,  Rfl: 3   budesonide-formoterol (SYMBICORT) 160-4.5 MCG/ACT inhaler, Inhale 2 puffs into the lungs 2 (two) times daily., Disp: 1 each, Rfl: 6   levothyroxine (SYNTHROID) 50 MCG tablet, Take 50 mcg by mouth daily before breakfast., Disp: , Rfl:    phenylephrine-shark liver oil-mineral oil-petrolatum (PREPARATION H) 0.25-3-14-71.9 % rectal ointment, Place 1 application rectally daily as needed for hemorrhoids., Disp: , Rfl:    Pirfenidone (ESBRIET) 801 MG TABS, Take 1 tablet (801 mg) by mouth in the morning, at noon, and at bedtime., Disp: 270 tablet, Rfl: 1   rosuvastatin (CRESTOR) 20 MG tablet, Take 1 tablet (20 mg total) by mouth daily., Disp: 90 tablet, Rfl: 3   sildenafil (REVATIO) 20 MG tablet, Take 20 mg by mouth daily as needed (FOR ED). , Disp: , Rfl:    tiZANidine (ZANAFLEX) 2 MG tablet, Take 2 mg by mouth at bedtime., Disp: , Rfl:    traMADol (ULTRAM) 50 MG tablet, Take 50 mg by mouth See admin instructions. Take one tablet (50 mg) by mouth daily at bedtime, may also take one tablet (50 mg) twice daily as needed for pain, Disp: , Rfl:       Objective:   Vitals:   05/06/22 1430  BP: 120/78  Pulse: 65  Temp: 98.4 F (36.9 C)  TempSrc: Oral  SpO2: 94%  Weight: 232 lb (105.2 kg)  Height: 6' 1.5" (1.867 m)    Estimated body mass index is 30.19 kg/m as calculated from the following:   Height as of this encounter: 6' 1.5" (1.867 m).   Weight as of this encounter: 232 lb (105.2 kg).  '@WEIGHTCHANGE'$ @  [employer]   05/06/22 1430  Weight: 232 lb (105.2 kg)      Physical Exam    General: No distress. Looks well Neuro: Alert and Oriented x 3. GCS 15. Speech normal Psych: Pleasant Resp:  Barrel Chest - no.  Wheeze - no, Crackles - basal crackles +, No overt respiratory distress CVS: Normal heart sounds. Murmurs - no Ext: Stigmata of Connective Tissue Disease - no HEENT: Normal upper airway. PEERL +. No post nasal drip        Assessment:       ICD-10-CM   1. 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 Encounter for therapeutic drug monitoring  Z51.81     3. COPD GOLD 0/AB  J44.9     4. Elevated CK  R74.8          Plan:     Patient Instructions   #Pulmnary fibrosis - IPF  -clinically stable - handling esbriet well   Plan  - continue esbriet - check cbc, bmet, LFT 05/06/2022 - glad you are interested in     EMphysema - alpha 1 MM   -- stable disease - ran out of symbicort for months but now back on it since 05/05/22  Plan - continue symbicort scheduled with albuterol as neede   Elevated CK - noted FEb 2022  ? Drug related  Plan - recheck CK 05/06/2022   Followup  - 6-8 weeks  with Dr Chase Caller - 30 min slot to review test results  High complex condition requireing instive therapeuutic monitroing  SIGNATURE    Dr. Brand Males, M.D., F.C.C.P,  Pulmonary and Critical Care Medicine Staff Physician, [place] Director - Interstitial Lung Disease  Program  Pulmonary [place] [facility] [place], [place], [address]  Pager: [phone], If no  answer or between  15:00h - 7:00h: call [phone] Telephone: [phone]  2:50 PM 05/06/2022

## 2022-05-07 ENCOUNTER — Telehealth: Payer: Self-pay | Admitting: Internal Medicine

## 2022-05-07 DIAGNOSIS — Z79899 Other long term (current) drug therapy: Secondary | ICD-10-CM

## 2022-05-07 DIAGNOSIS — Z5181 Encounter for therapeutic drug level monitoring: Secondary | ICD-10-CM

## 2022-05-07 DIAGNOSIS — R748 Abnormal levels of other serum enzymes: Secondary | ICD-10-CM

## 2022-05-07 DIAGNOSIS — E785 Hyperlipidemia, unspecified: Secondary | ICD-10-CM

## 2022-05-07 LAB — CK TOTAL AND CKMB (NOT AT ARMC)
CK, MB: 2.3 ng/mL (ref 0–5.0)
Relative Index: 1 (ref 0–4.0)
Total CK: 223 U/L — ABNORMAL HIGH (ref 44–196)
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
## 2022-05-09 ENCOUNTER — Encounter: Payer: Self-pay | Admitting: Physician Assistant

## 2022-05-09 ENCOUNTER — Ambulatory Visit: Payer: Self-pay

## 2022-05-09 ENCOUNTER — Ambulatory Visit: Payer: Medicare Other | Admitting: Physician Assistant

## 2022-05-09 DIAGNOSIS — M5416 Radiculopathy, lumbar region: Secondary | ICD-10-CM

## 2022-05-09 DIAGNOSIS — M7662 Achilles tendinitis, left leg: Secondary | ICD-10-CM | POA: Diagnosis not present

## 2022-05-09 DIAGNOSIS — M7661 Achilles tendinitis, right leg: Secondary | ICD-10-CM | POA: Diagnosis not present

## 2022-05-09 DIAGNOSIS — M7062 Trochanteric bursitis, left hip: Secondary | ICD-10-CM | POA: Diagnosis not present

## 2022-05-09 DIAGNOSIS — M545 Low back pain, unspecified: Secondary | ICD-10-CM

## 2022-05-09 MED ORDER — LIDOCAINE HCL 1 % IJ SOLN
3.0000 mL | INTRAMUSCULAR | Status: AC | PRN
  Administered 2022-05-09: 3 mL

## 2022-05-09 MED ORDER — METHYLPREDNISOLONE ACETATE 40 MG/ML IJ SUSP
40.0000 mg | INTRAMUSCULAR | Status: AC | PRN
  Administered 2022-05-09: 40 mg via INTRA_ARTICULAR
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
## 2022-05-09 NOTE — Telephone Encounter (Signed)
Called and spoke with pt letting him know the results of bloodwork and info per MR. Pt is hesitant on stopping the Crestor without hearing from Dr. Johney Frame first to see if she is okay with med being stopped for a few weeks.  Stated to pt that I would send this over to her for her to review and that we would call him back once hearing from her about this and he verbalized understanding.  Dr. Johney Frame, please see pt's recent labwork and recs per Dr. Chase Caller and advise if you would be okay with pt's Crestor being stopped for 3-4 weeks to have pt's CK repeated with him being off of the Crestor.

## 2022-05-09 NOTE — Telephone Encounter (Signed)
Called and spoke with pt letting her know the info from Dr. Jacolyn Reedy office and she verbalized understanding. Order placed for labwork to be done in about 3 weeks. Nothing further needed.

## 2022-05-09 NOTE — Telephone Encounter (Signed)
Message Received: Today Erik Lean, MD  [place], [name], CMA; Nuala Alpha, LPN Caller: Unspecified (2 days ago,  8:26 PM) This is Dr. Gasper Sells covering for Erik Strong.  Given his elevation in his CPK, I think statin trial off medication is certainly reasonable.  Erik Strong- could we check lipids prior to Erik Strong f/u visit to see if other therapy for his CAC and Aortic atherosclerosis should be explored?   Thanks,  MAC     Pt aware of recommendations as indicated above per Dr. Gasper Sells (covering for Erik Strong).  Pt will come in for repeat lipids prior to his OV appt with Erik Strong on 12/5.  He will come for repeat lipids on 08/19/22.  Pt is aware to come fasting to this lab appt.  Pt advised to hold his statin until then.   Pt verbalized understanding and agrees with this plan.

## 2022-05-09 NOTE — Patient Outreach (Signed)
  Care Coordination   05/09/2022 Name: Erik Strong MRN: [id number] DOB: Dec 27, 1960   Care Coordination Outreach Attempts:  Contact was made with the patient today to offer care coordination services as a benefit of their health plan. The patient requested a return call on a later date.   Follow Up Plan:  Additional outreach attempts will be made to offer the patient care coordination information and services.   Encounter Outcome:  Pt. Scheduled  Care Coordination Interventions Activated:  No   Care Coordination Interventions:  No, not indicated Will address during scheduled outreach.   [facility] [phone]

## 2022-05-12 ENCOUNTER — Ambulatory Visit (AMBULATORY_SURGERY_CENTER): Payer: Medicare Other | Admitting: Gastroenterology

## 2022-05-12 ENCOUNTER — Encounter: Payer: Self-pay | Admitting: Gastroenterology

## 2022-05-12 VITALS — BP 99/56 | HR 47 | Temp 97.1°F | Resp 12 | Ht 73.0 in | Wt 228.0 lb

## 2022-05-12 DIAGNOSIS — K209 Esophagitis, unspecified without bleeding: Secondary | ICD-10-CM

## 2022-05-12 DIAGNOSIS — Z1211 Encounter for screening for malignant neoplasm of colon: Secondary | ICD-10-CM | POA: Diagnosis not present

## 2022-05-12 DIAGNOSIS — D122 Benign neoplasm of ascending colon: Secondary | ICD-10-CM

## 2022-05-12 DIAGNOSIS — Z1212 Encounter for screening for malignant neoplasm of rectum: Secondary | ICD-10-CM | POA: Diagnosis not present

## 2022-05-12 DIAGNOSIS — K3189 Other diseases of stomach and duodenum: Secondary | ICD-10-CM | POA: Diagnosis not present

## 2022-05-12 DIAGNOSIS — D123 Benign neoplasm of transverse colon: Secondary | ICD-10-CM | POA: Diagnosis not present

## 2022-05-12 DIAGNOSIS — K449 Diaphragmatic hernia without obstruction or gangrene: Secondary | ICD-10-CM | POA: Diagnosis not present

## 2022-05-12 DIAGNOSIS — K921 Melena: Secondary | ICD-10-CM

## 2022-05-12 DIAGNOSIS — K2100 Gastro-esophageal reflux disease with esophagitis, without bleeding: Secondary | ICD-10-CM | POA: Diagnosis not present

## 2022-05-12 MED ORDER — OMEPRAZOLE 40 MG PO CPDR: 40.0000 mg | DELAYED_RELEASE_CAPSULE | Freq: Every day | ORAL | 3 refills | Status: DC

## 2022-05-12 MED ORDER — SODIUM CHLORIDE 0.9 % IV SOLN: 500.0000 mL | Freq: Once | INTRAVENOUS | Status: DC
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
## 2022-05-12 NOTE — Progress Notes (Signed)
Sedate, gd SR, tolerated procedure well, VSS, report to RN 

## 2022-05-12 NOTE — Op Note (Signed)
[facility] Patient Name: Erik Strong Procedure Date: 05/12/2022 11:36 AM MRN: [id number] Endoscopist: [place]. Candis Schatz , MD Age: 61 Referring MD:  Date of Birth: 06-Jan-1961 Gender: Male Account #: [id number] Procedure:                Upper GI endoscopy Indications:              Melena Medicines:                Monitored Anesthesia Care Procedure:                Pre-Anesthesia Assessment:                           - Prior to the procedure, a History and Physical                            was performed, and patient medications and                            allergies were reviewed. The patient's tolerance of                            previous anesthesia was also reviewed. The risks                            and benefits of the procedure and the sedation                            options and risks were discussed with the patient.                            All questions were answered, and informed consent                            was obtained. Prior Anticoagulants: The patient has                            taken no previous anticoagulant or antiplatelet                            agents except for aspirin. ASA Grade Assessment:                            III - A patient with severe systemic disease. After                            reviewing the risks and benefits, the patient was                            deemed in satisfactory condition to undergo the                            procedure.  After obtaining informed consent, the endoscope was                            passed under direct vision. Throughout the                            procedure, the patient's blood pressure, pulse, and                            oxygen saturations were monitored continuously. The                            Endoscope was introduced through the mouth, and                            advanced to the third part of duodenum. The upper                             GI endoscopy was accomplished without difficulty.                            The patient tolerated the procedure well. Scope In: Scope Out: Findings:                 The examined portions of the nasopharynx,                            oropharynx and larynx were normal.                           Mildly severe esophagitis with no bleeding was                            found.                           A 2 cm hiatal hernia was present.                           Multiple dispersed small erosions with no bleeding                            and no stigmata of recent bleeding were found in                            the gastric body and in the gastric antrum.                            Biopsies were taken with a cold forceps for                            Helicobacter pylori testing. Estimated blood loss                            was minimal.  The exam of the stomach was otherwise normal.                           Patchy mildly erythematous mucosa was found in the                            duodenal bulb.                           The exam of the duodenum was otherwise normal. Complications:            No immediate complications. Estimated Blood Loss:     Estimated blood loss was minimal. Impression:               - The examined portions of the nasopharynx,                            oropharynx and larynx were normal.                           - Mildly severe reflux esophagitis with no bleeding.                           - 2 cm hiatal hernia.                           - Erosive gastropathy with no bleeding and no                            stigmata of recent bleeding. Biopsied. This could                            have been a source of low grade upper GI bleeding.                           - Erythematous duodenopathy. Recommendation:           - Patient has a contact number available for                            emergencies. The signs and symptoms of potential                             delayed complications were discussed with the                            patient. Return to normal activities tomorrow.                            Written discharge instructions were provided to the                            patient.                           - Resume previous diet.                           -  Continue present medications.                           - Await pathology results.                           - Use Prilosec (omeprazole) 40 mg PO daily.                           - Avoid NSAIDs Oluwatoni Rotunno E. Candis Schatz, MD 05/12/2022 12:33:31 PM This report has been signed electronically.

## 2022-05-12 NOTE — Op Note (Addendum)
[facility] Patient Name: Erik Strong Procedure Date: 05/12/2022 11:35 AM MRN: [id number] Endoscopist: Nicki Reaper E. Candis Schatz , MD Age: 61 Referring MD:  Date of Birth: 04-Dec-1960 Gender: Male Account #: [id number] Procedure:                Colonoscopy Indications:              Screening for colorectal malignant neoplasm (last                            colonoscopy was more than 10 years ago) Medicines:                Monitored Anesthesia Care Procedure:                Pre-Anesthesia Assessment:                           - Prior to the procedure, a History and Physical                            was performed, and patient medications and                            allergies were reviewed. The patient's tolerance of                            previous anesthesia was also reviewed. The risks                            and benefits of the procedure and the sedation                            options and risks were discussed with the patient.                            All questions were answered, and informed consent                            was obtained. Prior Anticoagulants: The patient has                            taken no previous anticoagulant or antiplatelet                            agents except for aspirin. ASA Grade Assessment:                            III - A patient with severe systemic disease. After                            reviewing the risks and benefits, the patient was                            deemed in satisfactory condition to undergo the  procedure.                           After obtaining informed consent, the colonoscope                            was passed under direct vision. Throughout the                            procedure, the patient's blood pressure, pulse, and                            oxygen saturations were monitored continuously. The                            CF [id number] #[id number] was introduced through the  anus                            and advanced to the the cecum, identified by                            appendiceal orifice and ileocecal valve. The                            colonoscopy was performed without difficulty. The                            patient tolerated the procedure well. The quality                            of the bowel preparation was fair. The ileocecal                            valve, appendiceal orifice, and rectum were                            photographed. The bowel preparation used was SUPREP                            via split dose instruction. Scope In: 11:56:04 AM Scope Out: 12:24:00 PM Scope Withdrawal Time: 0 hours 25 minutes 5 seconds  Total Procedure Duration: 0 hours 27 minutes 56 seconds  Findings:                 The perianal and digital rectal examinations were                            normal. Pertinent negatives include normal                            sphincter tone and no palpable rectal lesions.                           Five sessile polyps were found in the ascending  colon. The polyps were 4 to 10 mm in size. These                            polyps were removed with a cold snare. Resection                            and retrieval were complete. Estimated blood loss                            was minimal.                           A 10 mm polyp was found in the transverse colon.                            The polyp was sessile. The polyp was removed with a                            cold snare. Resection and retrieval were complete.                            Estimated blood loss was minimal.                           The exam was otherwise normal throughout the                            examined colon.                           Anal papilla(e) were hypertrophied.                           Non-bleeding internal hemorrhoids were found during                            retroflexion. The hemorrhoids were Grade I                             (internal hemorrhoids that do not prolapse).                           No additional abnormalities were found on                            retroflexion. Complications:            No immediate complications. Estimated Blood Loss:     Estimated blood loss was minimal. Impression:               - Preparation of the colon was fair.                           - Five 4 to 10 mm polyps in the ascending colon,  removed with a cold snare. Resected and retrieved.                           - One 10 mm polyp in the transverse colon, removed                            with a cold snare. Resected and retrieved.                           - Anal papilla(e) were hypertrophied.                           - Non-bleeding internal hemorrhoids. Recommendation:           - Patient has a contact number available for                            emergencies. The signs and symptoms of potential                            delayed complications were discussed with the                            patient. Return to normal activities tomorrow.                            Written discharge instructions were provided to the                            patient.                           - Resume previous diet.                           - Continue present medications.                           - Await pathology results.                           - Repeat colonoscopy (date not yet determined) for                            surveillance based on pathology results. Kelcee Bjorn E. Candis Schatz, MD 05/12/2022 12:39:52 PM This report has been signed electronically.

## 2022-05-12 NOTE — Patient Instructions (Signed)
HANDOUTS ON POLYPS AND HEMORRHOIDS GIVEN.   USE PRILOSEC (OMEPRAZOLE) 40 MG BY MOUTH DAILY.   AVOID NON-STEROIDAL ANTI-INFLAMMATORY DRUGS (NSAIDS).   YOU HAD AN ENDOSCOPIC PROCEDURE TODAY AT [facility]:   Refer to the procedure report that was given to you for any specific questions about what was found during the examination.  If the procedure report does not answer your questions, please call your gastroenterologist to clarify.  If you requested that your care partner not be given the details of your procedure findings, then the procedure report has been included in a sealed envelope for you to review at your convenience later.  YOU SHOULD EXPECT: Some feelings of bloating in the abdomen. Passage of more gas than usual.  Walking can help get rid of the air that was put into your GI tract during the procedure and reduce the bloating. If you had a lower endoscopy (such as a colonoscopy or flexible sigmoidoscopy) you may notice spotting of blood in your stool or on the toilet paper. If you underwent a bowel prep for your procedure, you may not have a normal bowel movement for a few days.  Please Note:  You might notice some irritation and congestion in your nose or some drainage.  This is from the oxygen used during your procedure.  There is no need for concern and it should clear up in a day or so.  SYMPTOMS TO REPORT IMMEDIATELY:  Following lower endoscopy (colonoscopy or flexible sigmoidoscopy):  Excessive amounts of blood in the stool  Significant tenderness or worsening of abdominal pains  Swelling of the abdomen that is new, acute  Fever of 100F or higher  Following upper endoscopy (EGD)  Vomiting of blood or coffee ground material  New chest pain or pain under the shoulder blades  Painful or persistently difficult swallowing  New shortness of breath  Fever of 100F or higher  Black, tarry-looking stools  For urgent or emergent issues, a gastroenterologist can  be reached at any hour by calling [phone]. Do not use MyChart messaging for urgent concerns.    DIET:  We do recommend a small meal at first, but then you may proceed to your regular diet.  Drink plenty of fluids but you should avoid alcoholic beverages for 24 hours.  ACTIVITY:  You should plan to take it easy for the rest of today and you should NOT DRIVE or use heavy machinery until tomorrow (because of the sedation medicines used during the test).    FOLLOW UP: Our staff will call the number listed on your records the next business day following your procedure.  We will call around 7:15- 8:00 am to check on you and address any questions or concerns that you may have regarding the information given to you following your procedure. If we do not reach you, we will leave a message.  If you develop any symptoms (ie: fever, flu-like symptoms, shortness of breath, cough etc.) before then, please call [phone].  If you test positive for Covid 19 in the 2 weeks post procedure, please call and report this information to [place].    If any biopsies were taken you will be contacted by phone or by letter within the next 1-3 weeks.  Please call us at [phone] if you have not heard about the biopsies in 3 weeks.    SIGNATURES/CONFIDENTIALITY: You and/or your care partner have signed paperwork which will be entered into your electronic medical record.  These signatures attest  to the fact that that the information above on your After Visit Summary has been reviewed and is understood.  Full responsibility of the confidentiality of this discharge information lies with you and/or your care-partner.

## 2022-05-12 NOTE — Progress Notes (Signed)
Called to room to assist during endoscopic procedure.  Patient ID and intended procedure confirmed with present staff. Received instructions for my participation in the procedure from the performing physician.  

## 2022-05-13 ENCOUNTER — Ambulatory Visit: Payer: Self-pay

## 2022-05-13 ENCOUNTER — Telehealth: Payer: Self-pay

## 2022-05-13 NOTE — Telephone Encounter (Signed)
  Follow up Call-     05/12/2022   11:01 AM  Call back number  Post procedure Call Back phone  # [phone]  Permission to leave phone message Yes     Patient questions:  Do you have a fever, pain , or abdominal swelling? No. Pain Score  0 *  Have you tolerated food without any problems? Yes.    Have you been able to return to your normal activities? Yes.    Do you have any questions about your discharge instructions: Diet   No. Medications  Yes.   Follow up visit  No.  Do you have questions or concerns about your Care? No.  Actions: * If pain score is 4 or above: No action needed, pain <4.  Patient asked if he is to take Prilosec?  Per procedure report, patient is to take Prilosec '40mg'$  daily, patient informed. SChaplin, RN,BSN

## 2022-05-16 NOTE — Patient Outreach (Signed)
  Care Coordination   Initial Visit Note    Name: Erik Strong MRN: [id number] DOB: 05/02/1961  Erik Strong is a 61 y.o. year old male who sees [place], L.[name], MD for primary care. I spoke with  Erik Strong by phone today.  What matters to the patients health and wellness today?  Health Maintenance    Goals Addressed             This Visit's Progress    Health Maintenance       Care Coordination Interventions: Reviewed plan for disease management. Reports adhering to plan and attending medical appointments as scheduled. Reviewed medications. Reports managing well. Expressed concerns regarding ability to receive Adderall. Unsure if difficulty is d/t need for prior authorization. Currently using [facility]. Will follow up with [facility] Pharmacist to discuss.  Due for AWV. Reports appointment is scheduled for 05/25/22.         SDOH assessments and interventions completed:  Yes  SDOH Interventions Today    Flowsheet Row Most Recent Value  SDOH Interventions   Food Insecurity Interventions Intervention Not Indicated  Transportation Interventions Intervention Not Indicated        Care Coordination Interventions Activated:  Yes  Care Coordination Interventions:  Yes, provided   Follow up plan:  Appointment scheduled for 06/08/22.    Encounter Outcome:  Pt. Visit Completed   [facility] [phone]

## 2022-05-16 NOTE — Patient Instructions (Signed)
Visit Information Thank you for allowing the Care Management team to participate in your care. It was great speaking with you!   Following are the goals we discussed today:   Goals Addressed             This Visit's Progress    Health Maintenance       Care Coordination Interventions: Reviewed plan for disease management. Reports adhering to plan and attending medical appointments as scheduled. Reviewed medications. Reports managing well. Expressed concerns regarding ability to receive Adderall. Unsure if difficulty is d/t need for prior authorization. [facility] using [facility]. Will follow up with [facility] Pharmacist to discuss.  Due for AWV. Reports appointment is scheduled for 05/25/22.         Our next appointment is by telephone on June 08, 2022  at 2pm. Please call the care guide team at [phone] if you need to cancel or reschedule your appointment.   Mr. Debroux verbalized understanding of information discussed during the telephonic outreach. Declined need for mailed instructions or resources.   [facility] [phone]

## 2022-05-19 NOTE — Progress Notes (Signed)
Erik Strong,  The biopsies taken from your stomach were normal with no evidence of Helicobacter pylori infection.  I recommend taking the omeprazole as discussed for at least a month, then stopping.  The polyps that I removed during your colonoscopy were completely benign but were proven to be "pre-cancerous" polyps that MAY have grown into cancers if they had not been removed.  Studies shows that at least 20% of women over age 60 and 30% of men over age 49 have pre-cancerous polyps.  Based on current nationally recognized surveillance guidelines, I recommend that you have a repeat colonoscopy in 3 years.   As discussed, given the fair bowel prep quality on this exam, it may be advisable to do a 2-day prep to ensure your colon is adequately cleansed.  If you develop any new rectal bleeding, abdominal pain or significant bowel habit changes, please contact me before then.

## 2022-05-25 DIAGNOSIS — J84112 Idiopathic pulmonary fibrosis: Secondary | ICD-10-CM | POA: Diagnosis not present

## 2022-05-25 DIAGNOSIS — E039 Hypothyroidism, unspecified: Secondary | ICD-10-CM | POA: Diagnosis not present

## 2022-05-25 DIAGNOSIS — N183 Chronic kidney disease, stage 3 unspecified: Secondary | ICD-10-CM | POA: Diagnosis not present

## 2022-05-25 DIAGNOSIS — E7800 Pure hypercholesterolemia, unspecified: Secondary | ICD-10-CM | POA: Diagnosis not present

## 2022-05-25 DIAGNOSIS — M2550 Pain in unspecified joint: Secondary | ICD-10-CM | POA: Diagnosis not present

## 2022-05-25 DIAGNOSIS — I700 Atherosclerosis of aorta: Secondary | ICD-10-CM | POA: Diagnosis not present

## 2022-05-25 DIAGNOSIS — Z0000 Encounter for general adult medical examination without abnormal findings: Secondary | ICD-10-CM | POA: Diagnosis not present

## 2022-06-08 ENCOUNTER — Ambulatory Visit: Payer: Self-pay

## 2022-06-08 NOTE — Patient Outreach (Signed)
  Care Coordination   Follow Up Visit Note   06/08/2022 Name: LANCELOT ALYEA MRN: [id number] DOB: [birth year]  [name] is a 61 y.o. year old male who sees [place], L.[name], MD for primary care. I spoke with  [name] by phone today.  What matters to the patients health and wellness today?  Health Maintenance    Goals Addressed             This Visit's Progress    COMPLETED: Health Maintenance       Care Coordination Interventions: Reviewed medications. During previous outreach patient expressed concerns r/t being able to receive Adderall as prescribed. Reports being able to obtain recent prescription. Reports being informed that he will be required to contact pharmacy prior to anticipated refill date to ensure medication is available. Agreed to contact the care management team if additional assistance is required.        SDOH assessments and interventions completed:  No     Care Coordination Interventions Activated:  Yes  Care Coordination Interventions:  Yes, provided   Follow up plan:  Mr. Budlong will call if additional care coordination assistance is required.    Encounter Outcome:  Pt. Visit Completed   [facility] [phone]

## 2022-06-20 ENCOUNTER — Ambulatory Visit: Payer: Medicare Other | Admitting: Physician Assistant

## 2022-06-20 ENCOUNTER — Encounter: Payer: Self-pay | Admitting: Physician Assistant

## 2022-06-20 DIAGNOSIS — M7062 Trochanteric bursitis, left hip: Secondary | ICD-10-CM | POA: Diagnosis not present

## 2022-06-20 DIAGNOSIS — M7661 Achilles tendinitis, right leg: Secondary | ICD-10-CM

## 2022-06-20 DIAGNOSIS — M545 Low back pain, unspecified: Secondary | ICD-10-CM

## 2022-06-20 DIAGNOSIS — M7662 Achilles tendinitis, left leg: Secondary | ICD-10-CM
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
## 2022-07-05 ENCOUNTER — Encounter: Payer: Self-pay | Admitting: Internal Medicine

## 2022-07-05 ENCOUNTER — Ambulatory Visit: Payer: Medicare Other | Admitting: Internal Medicine

## 2022-07-05 VITALS — BP 132/70 | HR 65 | Temp 98.1°F | Ht 73.0 in | Wt 235.4 lb

## 2022-07-05 DIAGNOSIS — Z5181 Encounter for therapeutic drug level monitoring: Secondary | ICD-10-CM | POA: Diagnosis not present

## 2022-07-05 DIAGNOSIS — Z7184 Encounter for health counseling related to travel: Secondary | ICD-10-CM

## 2022-07-05 DIAGNOSIS — J84112 Idiopathic pulmonary fibrosis: Secondary | ICD-10-CM | POA: Diagnosis not present

## 2022-07-05 DIAGNOSIS — Z2911 Encounter for prophylactic immunotherapy for respiratory syncytial virus (RSV): Secondary | ICD-10-CM | POA: Diagnosis not present

## 2022-07-05 DIAGNOSIS — Z23 Encounter for immunization: Secondary | ICD-10-CM | POA: Diagnosis not present

## 2022-07-05 LAB — BASIC METABOLIC PANEL WITH GFR
BUN: 14 mg/dL (ref 6–23)
CO2: 29 meq/L (ref 19–32)
Calcium: 9.4 mg/dL (ref 8.4–10.5)
Chloride: 103 meq/L (ref 96–112)
Creatinine, Ser: 1.33 mg/dL (ref 0.40–1.50)
GFR: 58.3 mL/min — ABNORMAL LOW
Glucose, Bld: 100 mg/dL — ABNORMAL HIGH (ref 70–99)
Potassium: 4.3 meq/L (ref 3.5–5.1)
Sodium: 138 meq/L (ref 135–145)

## 2022-07-05 LAB — CBC WITH DIFFERENTIAL/PLATELET
Basophils Absolute: 0.1 10*3/uL (ref 0.0–0.1)
Basophils Relative: 0.8 % (ref 0.0–3.0)
Eosinophils Absolute: 0.1 10*3/uL (ref 0.0–0.7)
Eosinophils Relative: 1.5 % (ref 0.0–5.0)
HCT: 45 % (ref 39.0–52.0)
Hemoglobin: 15 g/dL (ref 13.0–17.0)
Lymphocytes Relative: 35.9 % (ref 12.0–46.0)
Lymphs Abs: 2.6 10*3/uL (ref 0.7–4.0)
MCHC: 33.4 g/dL (ref 30.0–36.0)
MCV: 94.2 fl (ref 78.0–100.0)
Monocytes Absolute: 0.5 10*3/uL (ref 0.1–1.0)
Monocytes Relative: 7.4 % (ref 3.0–12.0)
Neutro Abs: 4 10*3/uL (ref 1.4–7.7)
Neutrophils Relative %: 54.4 % (ref 43.0–77.0)
Platelets: 333 10*3/uL (ref 150.0–400.0)
RBC: 4.78 Mil/uL (ref 4.22–5.81)
RDW: 13.7 % (ref 11.5–15.5)
WBC: 7.4 10*3/uL (ref 4.0–10.5)

## 2022-07-05 LAB — HEPATIC FUNCTION PANEL
ALT: 24 U/L (ref 0–53)
AST: 19 U/L (ref 0–37)
Albumin: 4.2 g/dL (ref 3.5–5.2)
Alkaline Phosphatase: 61 U/L (ref 39–117)
Bilirubin, Direct: 0.1 mg/dL (ref 0.0–0.3)
Total Bilirubin: 0.4 mg/dL (ref 0.2–1.2)
Total Protein: 7.4 g/dL (ref 6.0–8.3)

## 2022-07-05 NOTE — Progress Notes (Signed)
OV 11/12/2020  Subjective:  Patient ID: Erik Strong, male , DOB: 1960/11/25 , age 61 y.o. , MRN: [id number] , ADDRESS: [place] [id number] PCP Alroy Dust, L.[name], MD Patient Care Team: Alroy Dust, Carlean Jews.[name], MD as PCP - General (Family Medicine)  This Provider for this visit: Treatment Team:  Attending Provider: Brand Males, MD    11/12/2020 -   Chief Complaint  Patient presents with   New Patient (Initial Visit)    Doing ok, winded at times     HPI Erik Strong 61 y.o. -referred by Dr. Christinia Gully to the ILD center because of discovery of pulmonary fibrosis he also has associated emphysema.  He says he has been disabled because of back issues.  He says he has severe ADD and sometimes he will have to search for key for 2 hours.  He says he can get overwhelmed easily and it might take multiple visits for him to fully grasp disease discussion and therapeutic management plan and prognosis.  [place] Integrated Comprehensive ILD Questionnaire  Symptoms: Insidious onset of shortness of breath gradually getting worse for the last several years.  Episodic dyspnea present severity scale below.  He does have difficulty keeping up with others of his age.  He does have some arthralgia.  He does have cough he coughs at night he brings up some phlegm.  It is whitish-green.  He does clear his throat he does feel this a tickle in the back of the throat occasional wheezing.  No nausea no vomiting no diarrhea.    Past Medical History : As below but no collagen vascular disease or vasculitis.  Does have chronic kidney disease no history of blood clots   ROS:   Does have fatigue arthralgia he does have cracked skin in his fingers.  Does have arthralgia.  Also has daytime somnolence.  He is not able to use his CPAP no recurrent fever no weight loss.  Does have  FAMILY HISTORY of LUNG DISEASE:   Denies   EXPOSURE HISTORY: Between 1978 and 2018 he smoked 30  cigarettes/day and then quit.  Does not smoke cigars no smoking pipes no vaping.  Current marijuana user.  In 2003 use some cocaine.  HOME and HOBBY DETAILS : Single-family home in the [place] setting.  He is lived in the home for 50 years.  The home itself is 06/06/1961 built and is 60 years old.  There is dampness in the basement he does do some occasional yard work and Optician, dispensing but otherwise no exposure to mold organic antigens he does have cats and dogs   OCCUPATIONAL HISTORY (122 questions) : He has done work with damp air-conditioned spaces.  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  Done pest control work.  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  Done carpentry done wood trimming.  Done oil heating done asbestos work done [employer] work.  Done epoxy reasons.  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  Done plastic work.  Done insulation work.  Done metal grinding done machine operating done industrial work.  Done lack ring done spray painting.  Done waterproofing done 5 working.  Done flooring.  Done metal legs.  Done Nurse, learning disability.  Also worked in [place] (27 items): denies  CT Chest high Resolution 09/25/2020 -personally visualized and agree with the findings of classic UIP associated with some emphysema.  Narrative & Impression  CLINICAL DATA:  61 year old male with history of asbestos exposure. Intermittent wheezing. Former smoker (quit 4 years ago). Suspected interstitial lung disease.   EXAM: CT CHEST WITHOUT CONTRAST   TECHNIQUE: Multidetector CT imaging of the chest was performed following the standard protocol without intravenous contrast. High resolution imaging of the lungs, as well as inspiratory and expiratory imaging, was performed.   COMPARISON:  No priors.    FINDINGS: Cardiovascular: Heart size is normal. There is no significant pericardial fluid, thickening or pericardial calcification. There is aortic atherosclerosis, as well as atherosclerosis of the great vessels of the mediastinum and the coronary arteries, including calcified atherosclerotic plaque in the left anterior descending and right coronary arteries.   Mediastinum/Nodes: No pathologically enlarged mediastinal or hilar lymph nodes. Please note that accurate exclusion of hilar adenopathy is limited on noncontrast CT scans. Esophagus is unremarkable in appearance. No axillary lymphadenopathy.   Lungs/Pleura: High-resolution images demonstrate patchy regions of ground-glass attenuation, septal thickening, subpleural reticulation, traction bronchiectasis and frank honeycombing. Findings have a craniocaudal gradient. Inspiratory and expiratory imaging is unremarkable. No acute consolidative airspace disease. No pleural effusions. No definite suspicious appearing pulmonary nodules or masses are noted. Mild diffuse bronchial wall thickening with mild centrilobular and paraseptal emphysema.   Upper Abdomen: Low-attenuation adrenal nodules bilaterally measuring 2.9 x 1.9 cm on the right (6 HU) and 2.0 x 1.6 cm on the left (3 HU), compatible with benign adrenal adenomas. Aortic atherosclerosis.   Musculoskeletal: There are no aggressive appearing lytic or blastic lesions noted in the visualized portions of the skeleton.   IMPRESSION: 1. 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 2. There is also mild diffuse bronchial wall thickening with mild centrilobular and paraseptal emphysema; imaging findings suggestive of underlying COPD. 3. Aortic atherosclerosis, in addition to left anterior descending and right coronary artery disease. Please note  that although the presence of coronary artery calcium documents the presence of coronary artery disease, the severity of this disease and any potential stenosis cannot be assessed on this non-gated CT examination. Assessment for potential risk factor modification, dietary therapy or pharmacologic therapy may be warranted, if clinically indicated. 4. Bilateral adrenal adenomas, as above.   Aortic Atherosclerosis (ICD10-I70.0) and Emphysema (ICD10-J43.9).     Electronically Signed   By: Vinnie Langton M.D.   On: 09/25/2020 14:33      PFT  PFT Results Latest Ref Rng & Units 10/21/2019  FVC-Pre L 4.59  FVC-Predicted Pre % 84  FVC-Post L 4.72  FVC-Predicted Post % 86  Pre FEV1/FVC % % 82  Post FEV1/FCV % % 79  FEV1-Pre L 3.76  FEV1-Predicted Pre % 90  FEV1-Post L 3.75  DLCO uncorrected ml/min/mmHg 27.70  DLCO UNC% % 88  DLCO corrected ml/min/mmHg 27.11  DLCO COR %Predicted % 86  DLVA Predicted % 91  TLC L 7.98  TLC % Predicted % 103  RV % Predicted % 140    Results for Erik Strong, Erik Strong (MRN [id number]) as of 11/12/2020 11:00  Ref. Range 09/07/2020 23:06  Creatinine Latest Ref Range: 0.61 - 1.24 mg/dL 1.63 (H)  Results for Erik Strong, Erik Strong (MRN [id number]) as of 11/12/2020 11:00  Ref. Range 09/07/2020 31:49  BASIC METABOLIC PANEL Unknown Rpt (A)  Sodium Latest Ref Range: 135 -  145 mmol/L 139  Potassium Latest Ref Range: 3.5 - 5.1 mmol/L 4.1  Chloride Latest Ref Range: 98 - 111 mmol/L 100  CO2 Latest Ref Range: 22 - 32 mmol/L 29  Glucose Latest Ref Range: 70 - 99 mg/dL 104 (H)      Creatinine Latest Ref Range: 0.61 - 1.24 mg/dL 1.63 (H)  Calcium Latest Ref Range: 8.9 - 10.3 mg/dL 9.3  Anion gap Latest Ref Range: 5 - 15  10  GFR, Estimated Latest Ref Range: >60 mL/min 49 (L)  Results for Erik Strong, Erik Strong (MRN [id number]) as of 11/12/2020 11:00  Ref. Range 09/07/2020 23:06  Hemoglobin Latest Ref Range: 13.0 - 17.0 g/dL 16.6    OV 02/08/2021  Subjective:  Patient ID: Erik Strong, male , DOB: 1961-03-31 , age 61 y.o. , MRN: [id number] , ADDRESS: [place] [id number] PCP Alroy Dust, L.[name], MD Patient Care Team: Alroy Dust, Carlean Jews.[name], MD as PCP - General (Family Medicine)  This Provider for this visit: Treatment Team:  Attending Provider: Brand Males, MD    02/08/2021 -   Chief Complaint  Patient presents with   Follow-up    Pt states he has been doing okay since last visit. States he still becomes SOB with exertion.   Follow-up idiopathic pulmonary fibrosis `- Diagnoses given 11/13/2020 [myositis panel negative but CK and aldolase slightly elevated]  -Clinical diagnosis based on male gender, classic UIP and serology only being trace positive although age only 69 and prior smoking history  - Last PFT February 2021 -Last high-resolution CT January 2022 -Started pirfenidone end of March 2022   Associated emphysema alpha-1 MM  -On Symbicort  Associated significant attention deficit disorder  Normal cardiac stress test March 2022  HPI Erik Strong 61 y.o. -returns for follow-up.  He is now on pirfenidone since end of March 2022.  He saying he is tolerating it fine except for occasional nausea that is very mild.  There are no real problems.  Overall he is feeling good.  Today he is wearing sleeveless T-shirt and shorts.  He has a history of skin cancer.  He says he is not putting or applying sunscreen although he notes that he needs to.  He is now on pirfenidone as well.  I cautioned him about this.  His last liver function test was in February 2022.  He will have his liver function test today and he is agreed.  His symptom score and walking desaturation test appears stable.  His last pulmonary function test was over a year ago and he is willing to have this scheduled prior to the next visit.   OV 01/25/2022  Subjective:  Patient ID: Erik Strong, male , DOB: 1961/06/07 , age 58 y.o. , MRN: [id number] , ADDRESS: [place] [id number] PCP Alroy Dust, L.[name], MD Patient Care Team: Alroy Dust, Carlean Jews.[name], MD as PCP - General (Family Medicine)  This Provider for this visit: Treatment Team:  Attending Provider: Brand Males, MD    01/25/2022 -   Chief Complaint  Patient presents with   Follow-up    Pt states he has been doing okay since last visit. States his breathing is about the same.    HPI Erik Strong 61 y.o. -returns for follow-up.  It has been a year since I last saw him after that he saw Dr. Melvyn Novas in August 2022 and not followed up.  He continues his pirfenidone although some 7 weeks ago he started running  out of it and started taking it only 1 or 2 tablets a day.  And then was completely out of it for 1 week.  Now for the last 2 weeks after getting his insurance paperwork sorted out he has been taking it at full dose.  He is tolerating it well.  There is no sunburn or any other side effect.  The main issue is that he does have nausea but no vomiting.  The nausea is mild.  He has no complaints overall he feels stable.  His last lab work was last year.  His last pulmonary function test was 2 years ago last CT scan of the chest was 1 year ago.  He does not smoke.  He denies any new health issues no changes in medications.  He is on disability for his hip and his knee does bother him.  However he does think he can walk a 6-minute walk test.   His symptoms: Walking desaturation appears stable over time.Marland Kitchen  His weight is also stable.  He does have OSA but unable to tolerate     OV 05/06/2022  Subjective:  Patient ID: Erik Strong, male , DOB: 1961-01-28 , age 6 y.o. , MRN: [id number] , ADDRESS: [place] [address] PCP Alroy Dust, L.[name], MD Patient Care Team: Alroy Dust, Carlean Jews.[name], MD as PCP - General (Family Medicine)  This Provider for this visit: Treatment Team:  Attending Provider: Brand Males, MD   05/06/2022 -   Chief Complaint  Patient presents with    Follow-up    PFT performed today.  Pt states he has been doing okay since last visit. States his breathing has been doing okay.     HPI ANDRIY SHERK 61 y.o. -returns for follow-up.  At this point in time he is stable on pirfenidone.  His symptoms are minimal.  He does say that when he goes out and son there is some burning but there is no rash.  He has noticed that niacin in the past.  He was wondering if it was because of pirfenidone.  I confirmed the same.  But he does use sunscreen and this brings down the bone intensity and definitely no skin rash at any time or erythema.  He has no new complaints.  He had pulmonary function test shows slight reduction that is probably not clinically significant.  He states that he ran out of Symbicort due to insurance issues few months ago and just ordered it yesterday.  He suspects is because of that.  His symptom score is actually better.  He had high-resolution CT chest in summer 2023 and is stable.  Overall he believes he is stable.   We discussed clinical trials as a care option and coverered the following  CT Chest data  OV 07/05/2022  Subjective:  Patient ID: Erik Strong, male , DOB: 1961-03-09 , age 3 y.o. , MRN: [id number] , ADDRESS: [place] [id number] PCP Alroy Dust, L.[name], MD Patient Care Team: Alroy Dust, Carlean Jews.[name], MD as PCP - General (Family Medicine)  This Provider for this visit: Treatment Team:  Attending Provider: Brand Males, MD   Follow-up idiopathic pulmonary fibrosis `- Diagnoses given 11/13/2020 [myositis panel negative but CK and aldolase slightly elevated]  -Clinical diagnosis based on male gender, classic UIP and serology only being trace positive although age only 71 and prior smoking history  - Last PFT February 2021 -Last high-resolution CT January 2022 -> June 2023 is stable -Started pirfenidone end  of March 2022   Associated emphysema alpha-1 MM  -On Symbicort  Associated  significant attention deficit disorder  OSA unable to tolerate CPAP  Normal cardiac stress test March 2022. Last echo April 2022  Current active marijuana smoker -last July 03 2022   11110/17/2023 -   Chief Complaint  Patient presents with   Follow-up    Cough a little worse. Fever 1 week ago.  SOB about the same.  Would like flu vaccine today     HPI Erik Strong 61 y.o. -returns for routine follow-up.  He states he is doing stable.  Infection from scores are stable.  A few weeks ago he had temperature of 100.5 COVID test was negative.  He did have some slight increased cough but then this settled.  He is back to baseline.  He is tolerating pirfenidone well except for 1 or 2 minutes of dizziness 30 minutes after taking the pirfenidone it is extremely mild.  His smoking is in remission.  He has not done cocaine in 20 years.  He still smokes marijuana.  He says there is no craving for it he does smoke for fun.  Last use was few days ago.  We spoke about the hazards of smoking marijuana.  He is interested in clinical trials and this will exclude patients who smoke marijuana.  Regardless I have told him about the need to quit marijuana to protect his lung health.  He understands this.  He wants to have his COVID mRNA booster.  He will have it on his own.  This week and is going on a charge trip to [facility].  He will have his RSV and flu shot today.  We discussed this.   For his emphysema: His son Symbicort and continues this  He has slightly unexplained elevated CK.  Last check in August 2023 this was coming down.  We will check this again.  SYMPTOM SCALE - ILD 11/12/2020  02/08/2021 Now on esbriet 225# 01/25/2022 231#.  Pirfenidone present 05/06/2022 232#  = esbriet 07/05/2022   O2 use ra ts ra ra ra  Shortness of Breath 0 -> 5 scale with 5 being worst (score 6 If unable to do)      At rest 1 0/5 1 0 0  Simple tasks - showers, clothes change, eating, shaving '2 1 2 '$ 0.5 1   Household (dishes, doing bed, laundry) '2 2 3 '$ 0/5 2  Shopping '3 3 2 1 2  '$ Walking level at own pace '1 2 2 '$ 1.5 1  Walking up Stairs '3 3 3 '$ 2.5 3  Total (30-36) Dyspnea Score 12 11.'5 13 6 9  '$ How bad is your cough? 2 1.'5 2 1 1  '$ How bad is your fatigue '2 3 3 2 2  '$ How bad is nausea 0 0.'5 2 1 1  '$ How bad is vomiting?  0 0 0 0 0  How bad is diarrhea? 0 0 0 0 0  How bad is anxiety? '2 1 2 2 1  '$ How bad is depression 1 0 0 0 1  6    Simple office walk 185 feet x  3 laps goal with forehead probe 11/12/2020  /yf  01/25/2022   O2 used ra ra ra  Number laps completed '3 3 3  '$ Comments about pace Slow pace Mod pae   Resting Pulse Ox/HR 96% and 71/min 100% and 73 100% ad HR 59  Final Pulse Ox/HR 95% and 85/min 97% and 93/,o 99% and  HR 76  Desaturated </= 88% no no   Desaturated <= 3% points no Yes, 3   Got Tachycardic >/= 90/min no Myes   Symptoms at end of test Some dsypnea last lap Mild-mod dyspnea   Miscellaneous comments x x stable     PFT     Latest Ref Rng & Units 05/06/2022    1:59 PM 10/21/2019    1:53 PM  PFT Results  FVC-Pre L 4.41  4.59   FVC-Predicted Pre % 81  84   FVC-Post L  4.72   FVC-Predicted Post %  86   Pre FEV1/FVC % % 79  82   Post FEV1/FCV % %  79   FEV1-Pre L 3.50  3.76   FEV1-Predicted Pre % 85  90   FEV1-Post L  3.75   DLCO uncorrected ml/min/mmHg 20.68  27.70   DLCO UNC% % 66  88   DLCO corrected ml/min/mmHg 20.68  27.11   DLCO COR %Predicted % 66  86   DLVA Predicted % 83  91   TLC L  7.98   TLC % Predicted %  103   RV % Predicted %  140        has a past medical history of ADD (attention deficit disorder), Anxiety, Arthritis, Chronic pain, History of kidney stones, and Temporal head injury.   reports that he quit smoking about 4 years ago. His smoking use included cigarettes. He started smoking about 44 years ago. He has a 84.00 pack-year smoking history. He has never used smokeless tobacco.  Past Surgical History:  Procedure Laterality Date   HIP  SURGERY     I & D EXTREMITY Left 09/02/2019   Procedure: IRRIGATION AND DEBRIDEMENT HAND;  Surgeon: Milly Jakob, MD;  Location: [facility];  Service: Orthopedics;  Laterality: Left;   TOOTH EXTRACTION Bilateral 12/30/2016   Procedure: EXTRACTION MOLARS;  Surgeon: Diona Browner, DDS;  Location: [facility];  Service: Oral Surgery;  Laterality: Bilateral;  Extraction of teeth four, five, twenty-six, thirty; removal of bilateral mandibular lingual tori    Allergies  Allergen Reactions   Other Other (See Comments)   Codeine Nausea And Vomiting   Macrodantin [Nitrofurantoin Macrocrystal] Rash   Shellfish Allergy Nausea And Vomiting    Immunization History  Administered Date(s) Administered   Influenza Split 05/10/2018, 05/04/2019, 09/08/2020   Influenza,inj,Quad PF,6+ Mos 09/09/2016, 06/15/2017, 05/10/2018, 05/04/2019   PFIZER(Purple Top)SARS-COV-2 Vaccination 12/16/2019, 01/08/2020, 09/08/2020   PNEUMOCOCCAL CONJUGATE-20 11/22/2021   Td 07/10/2006   Tdap 05/14/2012, 12/03/2014   Zoster Recombinat (Shingrix) 05/08/2019   Zoster, Live 05/08/2019, 05/20/2020    Family History  Problem Relation Age of Onset   Prostate cancer Father    CAD Maternal Uncle      Current Outpatient Medications:    acetaminophen (TYLENOL) 325 MG tablet, Take 2 tablets (650 mg total) by mouth every 6 (six) hours., Disp: , Rfl:    albuterol (VENTOLIN HFA) 108 (90 Base) MCG/ACT inhaler, TAKE 2 PUFFS BY MOUTH EVERY 6 HOURS AS NEEDED FOR WHEEZE OR SHORTNESS OF BREATH, Disp: 6.7 each, Rfl: 3   ALPRAZolam (XANAX) 0.5 MG tablet, Take 0.5 mg by mouth at bedtime., Disp: , Rfl:    amphetamine-dextroamphetamine (ADDERALL) 15 MG tablet, take 2 tablets by mouth in the morning., Disp: , Rfl:    aspirin EC 81 MG tablet, Take 1 tablet (81 mg total) by mouth daily. Swallow whole., Disp: 90 tablet, Rfl: 3   budesonide-formoterol (SYMBICORT) 160-4.5 MCG/ACT inhaler, Inhale 2 puffs into  the lungs 2 (two) times daily., Disp: 1 each, Rfl:  6   levothyroxine (SYNTHROID) 50 MCG tablet, Take 50 mcg by mouth daily before breakfast., Disp: , Rfl:    omeprazole (PRILOSEC) 40 MG capsule, Take 1 capsule (40 mg total) by mouth daily., Disp: 90 capsule, Rfl: 3   phenylephrine-shark liver oil-mineral oil-petrolatum (PREPARATION H) 0.25-3-14-71.9 % rectal ointment, Place 1 application rectally daily as needed for hemorrhoids., Disp: , Rfl:    Pirfenidone (ESBRIET) 801 MG TABS, Take 1 tablet (801 mg) by mouth in the morning, at noon, and at bedtime., Disp: 270 tablet, Rfl: 1   sildenafil (REVATIO) 20 MG tablet, Take 20 mg by mouth daily as needed (FOR ED). , Disp: , Rfl:    tiZANidine (ZANAFLEX) 2 MG tablet, Take 2 mg by mouth at bedtime., Disp: , Rfl:    traMADol (ULTRAM) 50 MG tablet, Take 50 mg by mouth See admin instructions. Take one tablet (50 mg) by mouth daily at bedtime, may also take one tablet (50 mg) twice daily as needed for pain, Disp: , Rfl:       Objective:   Vitals:   07/05/22 0933  BP: 132/70  Pulse: 65  Temp: 98.1 F (36.7 C)  TempSrc: Oral  SpO2: 96%  Weight: 235 lb 6.4 oz (106.8 kg)  Height: '6\' 1"'$  (1.854 m)    Estimated body mass index is 31.06 kg/m as calculated from the following:   Height as of this encounter: '6\' 1"'$  (1.854 m).   Weight as of this encounter: 235 lb 6.4 oz (106.8 kg).  '@WEIGHTCHANGE'$ @  [employer]   07/05/22 0933  Weight: 235 lb 6.4 oz (106.8 kg)     Physical Exam   General: No distress. Looks well Neuro: Alert and Oriented x 3. GCS 15. Speech normal Psych: Pleasant Resp:  Barrel Chest - no.  Wheeze - no, Crackles - YES BASE, No overt respiratory distress CVS: Normal heart sounds. Murmurs - no Ext: Stigmata of Connective Tissue Disease - no HEENT: Normal upper airway. PEERL +. No post nasal drip        Assessment:       ICD-10-CM   1. IPF (idiopathic pulmonary fibrosis) (HCC)  Q76.195 Pulmonary function test    CBC with Differential/Platelet    Basic metabolic panel     Hepatic function panel    CK total and CKMB (cardiac)not at [facility]    2. Need for RSV vaccination  Z29.11 RSV,Recombinant PF (Arexvy)    3. Medication monitoring encounter  Z51.81 Pulmonary function test    CBC with Differential/Platelet    Basic metabolic panel    Hepatic function panel    CK total and CKMB (cardiac)not at [facility]    4. Travel advice encounter  Z71.84     5. Flu vaccine need  Z23          Plan:     Patient Instructions   #Pulmnary fibrosis - IPF  -clinically stable - handling esbriet well   Plan  - continue esbriet - check cbc, bmet, LFT  07/05/2022 -advse for you to quit MJ to protect your lungs - glad you are interested in clnical trials  - but cannot enroll you in most studies till you quit MJ for minimum 3 months and stay quit though if  a study allows this we can look at it - do Spirometry and dlco in 3 months  Travel advise Vaccine counseling  Plan  - stay masked during travel. - vaccines  wont kick in for few weeks - if you get flu or covid - call us for anti-virak - get flu shot and RSV shot 07/05/2022 - covid MRNA booster on your own in few days    EMphysema - alpha 1 MM   -- stable disease on symbicort  Plan - continue symbicort scheduled with albuterol as neede   Elevated CK - noted FEb 2022  and Aug 2023 ? Drug related  Plan - recheck CK and CK-MB 07/05/2022    Followup  - 3 months with Dr Chase Caller - 70 min slot but after PFT    SIGNATURE    Dr. Brand Males, M.D., F.C.C.P,  Pulmonary and Critical Care Medicine Staff Physician, [place] Director - Interstitial Lung Disease  Program  Pulmonary [place] [facility] [place], [place], [address]  Pager: [phone], If no answer or between  15:00h - 7:00h: call [phone] Telephone: [phone]  10:04 AM 07/05/2022

## 2022-07-05 NOTE — Patient Instructions (Addendum)
#  Pulmnary fibrosis - IPF  -clinically stable - handling esbriet well   Plan  - continue esbriet - check cbc, bmet, LFT  07/05/2022 -advse for you to quit MJ to protect your lungs - glad you are interested in clnical trials  - but cannot enroll you in most studies till you quit MJ for minimum 3 months and stay quit though if  a study allows this we can look at it - do Spirometry and dlco in 3 months  Travel advise Vaccine counseling  Plan  - stay masked during travel. - vaccines wont kick in for few weeks - if you get flu or covid - call us for anti-virak - get flu shot and RSV shot 07/05/2022 - covid MRNA booster on your own in few days    EMphysema - alpha 1 MM   -- stable disease on symbicort  Plan - continue symbicort scheduled with albuterol as neede   Elevated CK - noted FEb 2022  and Aug 2023 ? Drug related  Plan - recheck CK and CK-MB 07/05/2022    Followup  - 3 months with Dr Chase Caller - 30 min slot but after PFT

## 2022-07-06 LAB — CK TOTAL AND CKMB (NOT AT ARMC)
CK, MB: 1.2 ng/mL (ref 0–5.0)
Relative Index: 0.6 (ref 0–4.0)
Total CK: 189 U/L (ref 44–196)

## 2022-08-01 ENCOUNTER — Ambulatory Visit: Payer: Medicare Other | Admitting: Orthopaedic Surgery

## 2022-08-01 ENCOUNTER — Encounter: Payer: Self-pay | Admitting: Orthopaedic Surgery

## 2022-08-01 ENCOUNTER — Other Ambulatory Visit: Payer: Self-pay

## 2022-08-01 DIAGNOSIS — M7661 Achilles tendinitis, right leg: Secondary | ICD-10-CM | POA: Diagnosis not present

## 2022-08-01 DIAGNOSIS — G8929 Other chronic pain: Secondary | ICD-10-CM

## 2022-08-01 DIAGNOSIS — M7662 Achilles tendinitis, left leg: Secondary | ICD-10-CM | POA: Diagnosis not present

## 2022-08-01 DIAGNOSIS — M545 Low back pain, unspecified: Secondary | ICD-10-CM

## 2022-08-01 NOTE — Progress Notes (Signed)
The patient comes in today with continued left-sided low back pain.  He actually had an epidural steroid injection I believe back in August by Dr. Ernestina Patches on the right side and that helped quite a bit.  His pain seems to be in the left side of his lumbar spine to the lower aspect of the facet joints.  On exam he does have pain to palpation in the paraspinal muscles to the left side of his lumbar spine and just to the midline and just left to that.  This is at the lower facet joints and it does hurt with flexion and extension.  Although he has had extensive surgery in his left hip his left hip exam is normal and there is no pain over the trochanteric area.  He also has significant right Achilles tendinitis.  This hurts quite a bit at the insertion of the Achilles.  Of note he has been through some physical therapy already.  The right-sided L5 injection by Dr. Ernestina Patches helped greatly.  His biggest complaints are of his left-sided low back pain.  I would like to try Thera-Band for his Achilles tendinitis as well as Voltaren gel.  Due to some GI issues he cannot take oral anti-inflammatories.  I would also like to see if Dr. Ernestina Patches can provide a facet joint injection to the left side at L4-L5 versus L5-S1.  The MRI of his lumbar spine did not show any significant issues in that area but based on his clinical exam findings and signs and symptoms I believe this type of injection could help him quite a bit.  We will see if Dr. Ernestina Patches can provide this injection and then get back to me about a month later.

## 2022-08-02 ENCOUNTER — Other Ambulatory Visit: Payer: Self-pay | Admitting: Physical Medicine and Rehabilitation

## 2022-08-02 DIAGNOSIS — M545 Low back pain, unspecified: Secondary | ICD-10-CM

## 2022-08-04 ENCOUNTER — Telehealth: Payer: Self-pay | Admitting: Physical Medicine and Rehabilitation

## 2022-08-04 NOTE — Telephone Encounter (Signed)
scheduled

## 2022-08-04 NOTE — Telephone Encounter (Signed)
Pt return call to set an appt. Please call pt at [phone]

## 2022-08-19 ENCOUNTER — Ambulatory Visit: Payer: Medicare Other | Attending: Cardiology

## 2022-08-19 DIAGNOSIS — R748 Abnormal levels of other serum enzymes: Secondary | ICD-10-CM

## 2022-08-19 DIAGNOSIS — Z5181 Encounter for therapeutic drug level monitoring: Secondary | ICD-10-CM

## 2022-08-19 DIAGNOSIS — E785 Hyperlipidemia, unspecified: Secondary | ICD-10-CM | POA: Diagnosis not present

## 2022-08-19 DIAGNOSIS — Z79899 Other long term (current) drug therapy: Secondary | ICD-10-CM | POA: Diagnosis not present

## 2022-08-19 LAB — LIPID PANEL
Chol/HDL Ratio: 5.7 ratio — ABNORMAL HIGH (ref 0.0–5.0)
Cholesterol, Total: 232 mg/dL — ABNORMAL HIGH (ref 100–199)
HDL: 41 mg/dL (ref >39)
LDL Chol Calc (NIH): 154 mg/dL — ABNORMAL HIGH (ref 0–99)
Triglycerides: 204 mg/dL — ABNORMAL HIGH (ref 0–149)
VLDL Cholesterol Cal: 37 mg/dL (ref 5–40)
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
## 2022-08-23 ENCOUNTER — Ambulatory Visit: Payer: Medicare Other | Attending: Cardiology | Admitting: Cardiology

## 2022-08-23 ENCOUNTER — Encounter: Payer: Self-pay | Admitting: Cardiology

## 2022-08-23 ENCOUNTER — Telehealth: Payer: Self-pay | Admitting: Gastroenterology

## 2022-08-23 ENCOUNTER — Telehealth: Payer: Self-pay | Admitting: Pharmacist

## 2022-08-23 VITALS — BP 128/70 | HR 90 | Ht 73.0 in | Wt 230.0 lb

## 2022-08-23 DIAGNOSIS — J849 Interstitial pulmonary disease, unspecified: Secondary | ICD-10-CM

## 2022-08-23 DIAGNOSIS — E785 Hyperlipidemia, unspecified: Secondary | ICD-10-CM

## 2022-08-23 DIAGNOSIS — I2510 Atherosclerotic heart disease of native coronary artery without angina pectoris: Secondary | ICD-10-CM

## 2022-08-23 DIAGNOSIS — K921 Melena: Secondary | ICD-10-CM

## 2022-08-23 MED ORDER — EZETIMIBE 10 MG PO TABS: 10.0000 mg | ORAL_TABLET | Freq: Every day | ORAL | 3 refills | Status: DC
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
## 2022-08-23 NOTE — Telephone Encounter (Signed)
Inbound call from patient inquiring if he should continue to take omeprazole. Please advise.  Thank you

## 2022-08-23 NOTE — Telephone Encounter (Signed)
-----   Message from Freada Bergeron, MD sent at 08/23/2022  9:28 AM EST ----- Do you think we should keep him off the crestor? I am guessing they stopped it due to elevated CK (we did not stop it).   Thank you!  -Nira Conn

## 2022-08-23 NOTE — Telephone Encounter (Signed)
Spoke with patient and informed him that he was only supposed to take omeprazole for 1 month per Dr.Cunningham's note. I let patien tknow that he could call back if he had any symptoms.

## 2022-08-23 NOTE — Patient Instructions (Signed)
Medication Instructions:  Your physician has recommended you make the following change in your medication:   Start Zetia 10 mg daily *If you need a refill on your cardiac medications before your next appointment, please call your pharmacy*   Follow-Up: At [facility], you and your health needs are our priority.  As part of our continuing mission to provide you with exceptional heart care, we have created designated Provider Care Teams.  These Care Teams include your primary Cardiologist (physician) and Advanced Practice Providers (APPs -  Physician Assistants and Nurse Practitioners) who all work together to provide you with the care you need, when you need it.  Your next appointment:   6 month(s)  The format for your next appointment:   In Person  Provider:   Freada Bergeron, MD     Other Instructions You have been referred to pharmacy lipid clinic.

## 2022-08-23 NOTE — Telephone Encounter (Signed)
Yes, would recommend referral to lipid clinic for consideration of PCSK9i

## 2022-08-24 NOTE — Telephone Encounter (Signed)
Pt is scheduled to see lipid clinic on 10/04/22 at 1030. Pt made aware of appt date and time by [facility] Scheduling.

## 2022-08-25 ENCOUNTER — Ambulatory Visit: Payer: Medicare Other | Admitting: Physical Medicine and Rehabilitation

## 2022-08-25 ENCOUNTER — Ambulatory Visit: Payer: Self-pay

## 2022-08-25 VITALS — BP 123/76 | HR 74

## 2022-08-25 DIAGNOSIS — M47816 Spondylosis without myelopathy or radiculopathy, lumbar region: Secondary | ICD-10-CM | POA: Diagnosis not present

## 2022-08-25 MED ORDER — METHYLPREDNISOLONE ACETATE 80 MG/ML IJ SUSP
80.0000 mg | Freq: Once | INTRAMUSCULAR | Status: AC
  Administered 2022-08-25: 80 mg

## 2022-08-25 NOTE — Progress Notes (Signed)
Numeric Pain Rating Scale and Functional Assessment Average Pain 7   In the last MONTH (on 0-10 scale) has pain interfered with the following?  1. General activity like being  able to carry out your everyday physical activities such as walking, climbing stairs, carrying groceries, or moving a chair?  Rating(7)   +Driver, -BT, -Dye Allergies.  Lower back pain with radiation into left leg. Pain with walking

## 2022-08-25 NOTE — Patient Instructions (Signed)

## 2022-08-25 NOTE — Procedures (Signed)
Lumbar Facet Joint Intra-Articular Injection(s) with Fluoroscopic Guidance  Patient: Erik Strong      Date of Birth: Dec 05, 1960 MRN: [id number] PCP: Alroy Dust, L.[name], MD      Visit Date: 08/25/2022   Universal Protocol:    Date/Time: 08/25/2022  Consent Given By: the patient  Position: PRONE   Additional Comments: Vital signs were monitored before and after the procedure. Patient was prepped and draped in the usual sterile fashion. The correct patient, procedure, and site was verified.   Injection Procedure Details:  Procedure Site One Meds Administered:  Meds ordered this encounter  Medications   methylPREDNISolone acetate (DEPO-MEDROL) injection 80 mg     Laterality: Left  Location/Site: Has left partial sacralization of L5 vertebral body L4-L5  Needle size: 22 guage  Needle type: Spinal  Needle Placement: Articular  Findings:  -Comments: Excellent flow of contrast producing a partial arthrogram.  Procedure Details: The fluoroscope beam is vertically oriented in AP, and the inferior recess is visualized beneath the lower pole of the inferior apophyseal process, which represents the target point for needle insertion. When direct visualization is difficult the target point is located at the medial projection of the vertebral pedicle. The region overlying each aforementioned target is locally anesthetized with a 1 to 2 ml. volume of 1% Lidocaine without Epinephrine.   The spinal needle was inserted into each of the above mentioned facet joints using biplanar fluoroscopic guidance. A 0.25 to 0.5 ml. volume of Isovue-250 was injected and a partial facet joint arthrogram was obtained. A single spot film was obtained of the resulting arthrogram.    One to 1.25 ml of the steroid/anesthetic solution was then injected into each of the facet joints noted above.   Additional Comments:  No complications occurred Dressing: 2 x 2 sterile gauze and Band-Aid    Post-procedure  details: Patient was observed during the procedure. Post-procedure instructions were reviewed.  Patient left the clinic in stable condition.

## 2022-08-25 NOTE — Progress Notes (Signed)
Erik Strong - 61 y.o. male MRN [id number]  Date of birth: [birth year]  Office Visit Note: Visit Date: 08/25/2022 PCP: Alroy Dust, L.[name], MD Referred by: Alroy Dust, L.[name], MD  Subjective: Chief Complaint  Patient presents with   Lower Back - Pain   HPI:  Erik Strong is a 61 y.o. male who comes in today at the request of Dr. Jean Rosenthal for planned Left  L4-5 Lumbar facet/medial branch block with fluoroscopic guidance.  The patient has failed conservative care including home exercise, medications, time and activity modification.  This injection will be diagnostic and hopefully therapeutic.  Please see requesting physician notes for further details and justification.  Exam has shown concordant pain with facet joint loading.   ROS Otherwise per HPI.  Assessment & Plan: Visit Diagnoses:    ICD-10-CM   1. Spondylosis without myelopathy or radiculopathy, lumbar region  M47.816 XR C-ARM NO REPORT    Facet Injection    methylPREDNISolone acetate (DEPO-MEDROL) injection 80 mg      Plan: No additional findings.   Meds & Orders:  Meds ordered this encounter  Medications   methylPREDNISolone acetate (DEPO-MEDROL) injection 80 mg    Orders Placed This Encounter  Procedures   Facet Injection   XR C-ARM NO REPORT    Follow-up: Return for visit to requesting provider as needed.   Procedures: No procedures performed  Lumbar Facet Joint Intra-Articular Injection(s) with Fluoroscopic Guidance  Patient: Erik Strong      Date of Birth: [birth year] MRN: [id number] PCP: Alroy Dust, L.[name], MD      Visit Date: 08/25/2022   Universal Protocol:    Date/Time: 08/25/2022  Consent Given By: the patient  Position: PRONE   Additional Comments: Vital signs were monitored before and after the procedure. Patient was prepped and draped in the usual sterile fashion. The correct patient, procedure, and site was verified.   Injection Procedure Details:  Procedure Site One Meds  Administered:  Meds ordered this encounter  Medications   methylPREDNISolone acetate (DEPO-MEDROL) injection 80 mg     Laterality: Left  Location/Site: Has left partial sacralization of L5 vertebral body L4-L5  Needle size: 22 guage  Needle type: Spinal  Needle Placement: Articular  Findings:  -Comments: Excellent flow of contrast producing a partial arthrogram.  Procedure Details: The fluoroscope beam is vertically oriented in AP, and the inferior recess is visualized beneath the lower pole of the inferior apophyseal process, which represents the target point for needle insertion. When direct visualization is difficult the target point is located at the medial projection of the vertebral pedicle. The region overlying each aforementioned target is locally anesthetized with a 1 to 2 ml. volume of 1% Lidocaine without Epinephrine.   The spinal needle was inserted into each of the above mentioned facet joints using biplanar fluoroscopic guidance. A 0.25 to 0.5 ml. volume of Isovue-250 was injected and a partial facet joint arthrogram was obtained. A single spot film was obtained of the resulting arthrogram.    One to 1.25 ml of the steroid/anesthetic solution was then injected into each of the facet joints noted above.   Additional Comments:  No complications occurred Dressing: 2 x 2 sterile gauze and Band-Aid    Post-procedure details: Patient was observed during the procedure. Post-procedure instructions were reviewed.  Patient left the clinic in stable condition.    Clinical History: No specialty comments available.     Objective:  VS:  HT:    WT:   BMI:  BP:123/76  HR:74bpm  TEMP: ( )  RESP:  Physical Exam Vitals and nursing note reviewed.  Constitutional:      General: He is not in acute distress.    Appearance: Normal appearance. He is not ill-appearing.  HENT:     Head: Normocephalic and atraumatic.     Right Ear: External ear normal.     Left Ear:  External ear normal.     Nose: No congestion.  Eyes:     Extraocular Movements: Extraocular movements intact.  Cardiovascular:     Rate and Rhythm: Normal rate.     Pulses: Normal pulses.  Pulmonary:     Effort: Pulmonary effort is normal. No respiratory distress.  Abdominal:     General: There is no distension.     Palpations: Abdomen is soft.  Musculoskeletal:        General: No tenderness or signs of injury.     Cervical back: Neck supple.     Right lower leg: No edema.     Left lower leg: No edema.     Comments: Patient has good distal strength without clonus.  Skin:    Findings: No erythema or rash.  Neurological:     General: No focal deficit present.     Mental Status: He is alert and oriented to person, place, and time.     Sensory: No sensory deficit.     Motor: No weakness or abnormal muscle tone.     Coordination: Coordination normal.  Psychiatric:        Mood and Affect: Mood normal.        Behavior: Behavior normal.      Imaging: XR C-ARM NO REPORT  Result Date: 08/25/2022 Please see Notes tab for imaging impression.

## 2022-09-20 DIAGNOSIS — R319 Hematuria, unspecified: Secondary | ICD-10-CM | POA: Diagnosis not present

## 2022-10-04 ENCOUNTER — Ambulatory Visit: Payer: Medicare Other | Attending: Cardiology | Admitting: Pharmacist

## 2022-10-04 DIAGNOSIS — E785 Hyperlipidemia, unspecified: Secondary | ICD-10-CM | POA: Diagnosis not present

## 2022-10-04 MED ORDER — ATORVASTATIN CALCIUM 20 MG PO TABS: 20.0000 mg | ORAL_TABLET | Freq: Every day | ORAL | 3 refills | Status: DC

## 2022-10-04 NOTE — Patient Instructions (Addendum)
Start taking atorvastatin '20mg'$  daily and ezetimibe '10mg'$  daily  Please come for blood work 3/12. This is fasting blood work. You may come to lab anytime between 7:15AM and 4:30 PM.

## 2022-10-04 NOTE — Assessment & Plan Note (Addendum)
Assessment: Minimal increase in CK on rosuvastatin 20 mg daily.  Improvement in CK was seen even while still on statin.  CK did normalize off of statin No significant muscle pains out of the ordinary during that time, however he was diagnosed with esophagitis. He is on disability and has Medicare, no extra help Although we have helpful grant currently, long-term unknown if this will remain an option Could be a challenge to get PCSK9 approved which suggests mild increase CK and only trial of 1 statin  Plan: Due to the minimal increase in CK, I feel its appropriate to re-challenge with statin. Will start with moderate intensity statin this time. Start atorvastatin 20 mg daily, continue ezetimibe 10 mg daily Recheck lipids CK and LFTs in 2 months
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
## 2022-10-18 ENCOUNTER — Ambulatory Visit (INDEPENDENT_AMBULATORY_CARE_PROVIDER_SITE_OTHER): Payer: Medicare Other | Admitting: Internal Medicine

## 2022-10-18 ENCOUNTER — Encounter: Payer: Self-pay | Admitting: Internal Medicine

## 2022-10-18 VITALS — BP 122/68 | HR 64 | Temp 98.1°F | Ht 73.0 in | Wt 229.0 lb

## 2022-10-18 DIAGNOSIS — J84112 Idiopathic pulmonary fibrosis: Secondary | ICD-10-CM

## 2022-10-18 DIAGNOSIS — Z5181 Encounter for therapeutic drug level monitoring: Secondary | ICD-10-CM

## 2022-10-18 LAB — HEPATIC FUNCTION PANEL
ALT: 36 U/L (ref 0–53)
AST: 22 U/L (ref 0–37)
Albumin: 4.4 g/dL (ref 3.5–5.2)
Alkaline Phosphatase: 62 U/L (ref 39–117)
Bilirubin, Direct: 0.1 mg/dL (ref 0.0–0.3)
Total Bilirubin: 0.4 mg/dL (ref 0.2–1.2)
Total Protein: 7.5 g/dL (ref 6.0–8.3)

## 2022-10-18 LAB — CBC WITH DIFFERENTIAL/PLATELET
Basophils Absolute: 0.1 10*3/uL (ref 0.0–0.1)
Basophils Relative: 0.7 % (ref 0.0–3.0)
Eosinophils Absolute: 0.1 10*3/uL (ref 0.0–0.7)
Eosinophils Relative: 0.8 % (ref 0.0–5.0)
HCT: 48.1 % (ref 39.0–52.0)
Hemoglobin: 16.3 g/dL (ref 13.0–17.0)
Lymphocytes Relative: 33.5 % (ref 12.0–46.0)
Lymphs Abs: 3.1 10*3/uL (ref 0.7–4.0)
MCHC: 33.9 g/dL (ref 30.0–36.0)
MCV: 94.1 fl (ref 78.0–100.0)
Monocytes Absolute: 0.7 10*3/uL (ref 0.1–1.0)
Monocytes Relative: 7.4 % (ref 3.0–12.0)
Neutro Abs: 5.3 10*3/uL (ref 1.4–7.7)
Neutrophils Relative %: 57.6 % (ref 43.0–77.0)
Platelets: 374 10*3/uL (ref 150.0–400.0)
RBC: 5.11 Mil/uL (ref 4.22–5.81)
RDW: 13.6 % (ref 11.5–15.5)
WBC: 9.2 10*3/uL (ref 4.0–10.5)

## 2022-10-18 LAB — PULMONARY FUNCTION TEST
DL/VA % pred: 75 %
DL/VA: 3.17 ml/min/mmHg/L
DLCO cor % pred: 71 %
DLCO cor: 21.52 ml/min/mmHg
DLCO unc % pred: 71 %
DLCO unc: 21.52 ml/min/mmHg
FEF 25-75 Pre: 3.62 L/sec
FEF2575-%Pred-Pre: 110 %
FEV1-%Pred-Pre: 88 %
FEV1-Pre: 3.56 L
FEV1FVC-%Pred-Pre: 106 %
FEV6-%Pred-Pre: 85 %
FEV6-Pre: 4.34 L
FEV6FVC-%Pred-Pre: 103 %
FVC-%Pred-Pre: 83 %
FVC-Pre: 4.41 L
Pre FEV1/FVC ratio: 81 %
Pre FEV6/FVC Ratio: 99 %

## 2022-10-18 LAB — BASIC METABOLIC PANEL
BUN: 17 mg/dL (ref 6–23)
CO2: 30 mEq/L (ref 19–32)
Calcium: 9.6 mg/dL (ref 8.4–10.5)
Chloride: 102 mEq/L (ref 96–112)
Creatinine, Ser: 1.24 mg/dL (ref 0.40–1.50)
GFR: 63.29 mL/min (ref >60.00)
Glucose, Bld: 108 mg/dL — ABNORMAL HIGH (ref 70–99)
Potassium: 4.3 mEq/L (ref 3.5–5.1)
Sodium: 137 mEq/L (ref 135–145)

## 2022-10-18 LAB — CK: Total CK: 131 U/L (ref 7–232)

## 2022-10-18 NOTE — Patient Instructions (Signed)
Spirometry and DLCO Performed Today.  

## 2022-10-18 NOTE — Patient Instructions (Addendum)
#  Pulmonary fibrosis - IPF  -clinically stable since Aug 2023 but worse sinc 2021. Esbriet since early 2022 helping - handling esbriet well   Plan  - continue esbriet - check cbc, bmet, LFT  10/18/2022 -advse for you to quit MJ to protect your lungs - take consent for [place] fellow 10/18/2022 - prescreen fail - do Spirometry and dlco in 5-6  months  Marijuana use  - helps nausea but in long term damanging to lungs  Plan - quit MJ use   Emphysema - alpha 1 MM   -- stable disease on symbicort  Plan - continue symbicort scheduled with albuterol as neede   Elevated CK - noted FEb 2022  and Aug 2023 ? Drug related but normal Oct 2023  Plan - recheck CK and CK-MB 10/18/2022  Others  Plan  - you should not do jury duty du eto lung disease;   - can do letter when you bring the summons  Followup  - 5-6 months with Dr Chase Caller - 30 min slot but after PFT

## 2022-10-18 NOTE — Progress Notes (Signed)
OV 11/12/2020  Subjective:  Patient ID: Erik Strong, male , DOB: 1960/11/25 , age 62 y.o. , MRN: [id number] , ADDRESS: [place] [id number] PCP Alroy Dust, L.[name], MD Patient Care Team: Alroy Dust, Carlean Jews.[name], MD as PCP - General (Family Medicine)  This Provider for this visit: Treatment Team:  Attending Provider: Brand Males, MD    11/12/2020 -   Chief Complaint  Patient presents with   New Patient (Initial Visit)    Doing ok, winded at times     HPI Erik Strong 62 y.o. -referred by Dr. Christinia Gully to the ILD center because of discovery of pulmonary fibrosis he also has associated emphysema.  He says he has been disabled because of back issues.  He says he has severe ADD and sometimes he will have to search for key for 2 hours.  He says he can get overwhelmed easily and it might take multiple visits for him to fully grasp disease discussion and therapeutic management plan and prognosis.  [place] Integrated Comprehensive ILD Questionnaire  Symptoms: Insidious onset of shortness of breath gradually getting worse for the last several years.  Episodic dyspnea present severity scale below.  He does have difficulty keeping up with others of his age.  He does have some arthralgia.  He does have cough he coughs at night he brings up some phlegm.  It is whitish-green.  He does clear his throat he does feel this a tickle in the back of the throat occasional wheezing.  No nausea no vomiting no diarrhea.    Past Medical History : As below but no collagen vascular disease or vasculitis.  Does have chronic kidney disease no history of blood clots   ROS:   Does have fatigue arthralgia he does have cracked skin in his fingers.  Does have arthralgia.  Also has daytime somnolence.  He is not able to use his CPAP no recurrent fever no weight loss.  Does have  FAMILY HISTORY of LUNG DISEASE:   Denies   EXPOSURE HISTORY: Between 1978 and 2018 he smoked 30  cigarettes/day and then quit.  Does not smoke cigars no smoking pipes no vaping.  Current marijuana user.  In 2003 use some cocaine.  HOME and HOBBY DETAILS : Single-family home in the [place] setting.  He is lived in the home for 50 years.  The home itself is 06/06/1961 built and is 61 years old.  There is dampness in the basement he does do some occasional yard work and Optician, dispensing but otherwise no exposure to mold organic antigens he does have cats and dogs   OCCUPATIONAL HISTORY (122 questions) : He has done work with damp air-conditioned spaces.  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  Done pest control work.  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  Done carpentry done wood trimming.  Done oil heating done asbestos work done [employer] work.  Done epoxy reasons.  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  Done plastic work.  Done insulation work.  Done metal grinding done machine operating done industrial work.  Done lack ring done spray painting.  Done waterproofing done 5 working.  Done flooring.  Done metal legs.  Done Nurse, learning disability.  Also worked in [place] (27 items): denies  CT Chest high Resolution 09/25/2020 -personally visualized and agree with the findings of classic UIP associated with some emphysema.  Narrative & Impression  CLINICAL DATA:  62 year old male with history of asbestos exposure. Intermittent wheezing. Former smoker (quit 4 years ago). Suspected interstitial lung disease.   EXAM: CT CHEST WITHOUT CONTRAST   TECHNIQUE: Multidetector CT imaging of the chest was performed following the standard protocol without intravenous contrast. High resolution imaging of the lungs, as well as inspiratory and expiratory imaging, was performed.   COMPARISON:  No priors.    FINDINGS: Cardiovascular: Heart size is normal. There is no significant pericardial fluid, thickening or pericardial calcification. There is aortic atherosclerosis, as well as atherosclerosis of the great vessels of the mediastinum and the coronary arteries, including calcified atherosclerotic plaque in the left anterior descending and right coronary arteries.   Mediastinum/Nodes: No pathologically enlarged mediastinal or hilar lymph nodes. Please note that accurate exclusion of hilar adenopathy is limited on noncontrast CT scans. Esophagus is unremarkable in appearance. No axillary lymphadenopathy.   Lungs/Pleura: High-resolution images demonstrate patchy regions of ground-glass attenuation, septal thickening, subpleural reticulation, traction bronchiectasis and frank honeycombing. Findings have a craniocaudal gradient. Inspiratory and expiratory imaging is unremarkable. No acute consolidative airspace disease. No pleural effusions. No definite suspicious appearing pulmonary nodules or masses are noted. Mild diffuse bronchial wall thickening with mild centrilobular and paraseptal emphysema.   Upper Abdomen: Low-attenuation adrenal nodules bilaterally measuring 2.9 x 1.9 cm on the right (6 HU) and 2.0 x 1.6 cm on the left (3 HU), compatible with benign adrenal adenomas. Aortic atherosclerosis.   Musculoskeletal: There are no aggressive appearing lytic or blastic lesions noted in the visualized portions of the skeleton.   IMPRESSION: 1. 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 2. There is also mild diffuse bronchial wall thickening with mild centrilobular and paraseptal emphysema; imaging findings suggestive of underlying COPD. 3. Aortic atherosclerosis, in addition to left anterior descending and right coronary artery disease. Please note  that although the presence of coronary artery calcium documents the presence of coronary artery disease, the severity of this disease and any potential stenosis cannot be assessed on this non-gated CT examination. Assessment for potential risk factor modification, dietary therapy or pharmacologic therapy may be warranted, if clinically indicated. 4. Bilateral adrenal adenomas, as above.   Aortic Atherosclerosis (ICD10-I70.0) and Emphysema (ICD10-J43.9).     Electronically Signed   By: Vinnie Langton M.D.   On: 09/25/2020 14:33      PFT  PFT Results Latest Ref Rng & Units 10/21/2019  FVC-Pre L 4.59  FVC-Predicted Pre % 84  FVC-Post L 4.72  FVC-Predicted Post % 86  Pre FEV1/FVC % % 82  Post FEV1/FCV % % 79  FEV1-Pre L 3.76  FEV1-Predicted Pre % 90  FEV1-Post L 3.75  DLCO uncorrected ml/min/mmHg 27.70  DLCO UNC% % 88  DLCO corrected ml/min/mmHg 27.11  DLCO COR %Predicted % 86  DLVA Predicted % 91  TLC L 7.98  TLC % Predicted % 103  RV % Predicted % 140    Results for ERI, PLATTEN (MRN [id number]) as of 11/12/2020 11:00  Ref. Range 09/07/2020 23:06  Creatinine Latest Ref Range: 0.61 - 1.24 mg/dL 1.63 (H)  Results for MARKEESE, BOYAJIAN (MRN [id number]) as of 11/12/2020 11:00  Ref. Range 09/07/2020 31:49  BASIC METABOLIC PANEL Unknown Rpt (A)  Sodium Latest Ref Range: 135 -  145 mmol/L 139  Potassium Latest Ref Range: 3.5 - 5.1 mmol/L 4.1  Chloride Latest Ref Range: 98 - 111 mmol/L 100  CO2 Latest Ref Range: 22 - 32 mmol/L 29  Glucose Latest Ref Range: 70 - 99 mg/dL 104 (H)      Creatinine Latest Ref Range: 0.61 - 1.24 mg/dL 1.63 (H)  Calcium Latest Ref Range: 8.9 - 10.3 mg/dL 9.3  Anion gap Latest Ref Range: 5 - 15  10  GFR, Estimated Latest Ref Range: >60 mL/min 49 (L)  Results for OSUALDO, HANSELL (MRN [id number]) as of 11/12/2020 11:00  Ref. Range 09/07/2020 23:06  Hemoglobin Latest Ref Range: 13.0 - 17.0 g/dL 16.6    OV 02/08/2021  Subjective:  Patient ID: Erik Strong, male , DOB: 1961-03-31 , age 61 y.o. , MRN: [id number] , ADDRESS: [place] [id number] PCP Alroy Dust, L.[name], MD Patient Care Team: Alroy Dust, Carlean Jews.[name], MD as PCP - General (Family Medicine)  This Provider for this visit: Treatment Team:  Attending Provider: Brand Males, MD    02/08/2021 -   Chief Complaint  Patient presents with   Follow-up    Pt states he has been doing okay since last visit. States he still becomes SOB with exertion.   Follow-up idiopathic pulmonary fibrosis `- Diagnoses given 11/13/2020 [myositis panel negative but CK and aldolase slightly elevated]  -Clinical diagnosis based on male gender, classic UIP and serology only being trace positive although age only 69 and prior smoking history  - Last PFT February 2021 -Last high-resolution CT January 2022 -Started pirfenidone end of March 2022   Associated emphysema alpha-1 MM  -On Symbicort  Associated significant attention deficit disorder  Normal cardiac stress test March 2022  HPI Erik Strong 62 y.o. -returns for follow-up.  He is now on pirfenidone since end of March 2022.  He saying he is tolerating it fine except for occasional nausea that is very mild.  There are no real problems.  Overall he is feeling good.  Today he is wearing sleeveless T-shirt and shorts.  He has a history of skin cancer.  He says he is not putting or applying sunscreen although he notes that he needs to.  He is now on pirfenidone as well.  I cautioned him about this.  His last liver function test was in February 2022.  He will have his liver function test today and he is agreed.  His symptom score and walking desaturation test appears stable.  His last pulmonary function test was over a year ago and he is willing to have this scheduled prior to the next visit.   OV 01/25/2022  Subjective:  Patient ID: Erik Strong, male , DOB: 1961/06/07 , age 58 y.o. , MRN: [id number] , ADDRESS: [place] [id number] PCP Alroy Dust, L.[name], MD Patient Care Team: Alroy Dust, Carlean Jews.[name], MD as PCP - General (Family Medicine)  This Provider for this visit: Treatment Team:  Attending Provider: Brand Males, MD    01/25/2022 -   Chief Complaint  Patient presents with   Follow-up    Pt states he has been doing okay since last visit. States his breathing is about the same.    HPI Erik Strong 62 y.o. -returns for follow-up.  It has been a year since I last saw him after that he saw Dr. Melvyn Novas in August 2022 and not followed up.  He continues his pirfenidone although some 7 weeks ago he started running  out of it and started taking it only 1 or 2 tablets a day.  And then was completely out of it for 1 week.  Now for the last 2 weeks after getting his insurance paperwork sorted out he has been taking it at full dose.  He is tolerating it well.  There is no sunburn or any other side effect.  The main issue is that he does have nausea but no vomiting.  The nausea is mild.  He has no complaints overall he feels stable.  His last lab work was last year.  His last pulmonary function test was 2 years ago last CT scan of the chest was 1 year ago.  He does not smoke.  He denies any new health issues no changes in medications.  He is on disability for his hip and his knee does bother him.  However he does think he can walk a 6-minute walk test.   His symptoms: Walking desaturation appears stable over time.Marland Kitchen  His weight is also stable.  He does have OSA but unable to tolerate     OV 05/06/2022  Subjective:  Patient ID: Erik Strong, male , DOB: 06-06-1961 , age 66 y.o. , MRN: [id number] , ADDRESS: [place] [phone] PCP Alroy Dust, L.[name], MD Patient Care Team: Alroy Dust, Carlean Jews.[name], MD as PCP - General (Family Medicine)  This Provider for this visit: Treatment Team:  Attending Provider: Brand Males, MD   05/06/2022 -   Chief Complaint  Patient presents with    Follow-up    PFT performed today.  Pt states he has been doing okay since last visit. States his breathing has been doing okay.     HPI Erik Strong 62 y.o. -returns for follow-up.  At this point in time he is stable on pirfenidone.  His symptoms are minimal.  He does say that when he goes out and son there is some burning but there is no rash.  He has noticed that niacin in the past.  He was wondering if it was because of pirfenidone.  I confirmed the same.  But he does use sunscreen and this brings down the bone intensity and definitely no skin rash at any time or erythema.  He has no new complaints.  He had pulmonary function test shows slight reduction that is probably not clinically significant.  He states that he ran out of Symbicort due to insurance issues few months ago and just ordered it yesterday.  He suspects is because of that.  His symptom score is actually better.  He had high-resolution CT chest in summer 2023 and is stable.  Overall he believes he is stable.   We discussed clinical trials as a care option and coverered the following  CT Chest data  OV 07/05/2022  Subjective:  Patient ID: Erik Strong, male , DOB: May 23, 1961 , age 89 y.o. , MRN: [id number] , ADDRESS: [place] [id number] PCP Alroy Dust, L.[name], MD Patient Care Team: Alroy Dust, Carlean Jews.[name], MD as PCP - General (Family Medicine)  This Provider for this visit: Treatment Team:  Attending Provider: [place]   [address]/17/2023 -   Chief Complaint  Patient presents with   Follow-up    Cough a little worse. Fever 1 week ago.  SOB about the same.  Would like flu vaccine today     HPI Erik Strong 62 y.o. -returns for routine follow-up.  He states he is doing stable.  Infection from  scores are stable.  A few weeks ago he had temperature of 100.5 COVID test was negative.  He did have some slight increased cough but then this settled.  He is back to baseline.  He is tolerating  pirfenidone well except for 1 or 2 minutes of dizziness 30 minutes after taking the pirfenidone it is extremely mild.  His smoking is in remission.  He has not done cocaine in 20 years.  He still smokes marijuana.  He says there is no craving for it he does smoke for fun.  Last use was few days ago.  We spoke about the hazards of smoking marijuana.  He is interested in clinical trials and this will exclude patients who smoke marijuana.  Regardless I have told him about the need to quit marijuana to protect his lung health.  He understands this.  He wants to have his COVID mRNA booster.  He will have it on his own.  This week and is going on a charge trip to [facility].  He will have his RSV and flu shot today.  We discussed this.   For his emphysema: His son Symbicort and continues this  He has slightly unexplained elevated CK.  Last check in August 2023 this was coming down.  We will check this again. OV 10/18/2022  Subjective:  Patient ID: Erik Strong, male , DOB: [birth year] , age 45 y.o. , MRN: [id number] , ADDRESS: [place] [phone] PCP Alroy Dust, L.[name], MD Patient Care Team: Alroy Dust, Carlean Jews.[name], MD as PCP - General (Family Medicine) Freada Bergeron, MD as PCP - Cardiology (Cardiology)  This Provider for this visit: Treatment Team:  Attending Provider: Brand Males, MD    Follow-up idiopathic pulmonary fibrosis `- Diagnoses given 11/13/2020 [myositis panel negative but CK and aldolase slightly elevated]  -Clinical diagnosis based on male gender, classic UIP and serology only being trace positive although age only 76 and prior smoking history  - Last PFT February 2021 -Last high-resolution CT January 2022 -> June 2023 is stable -Started pirfenidone end of March 2022   Associated emphysema alpha-1 MM  -On Symbicort  Associated significant attention deficit disorder  OSA unable to tolerate CPAP  Normal cardiac stress test March 2022.  Last echo April 2022  Current active marijuana smoker -last d then Jan 2024  10/18/2022 -   Chief Complaint  Patient presents with   Follow-up    PFT done today. Breathing is unchanged since his last visit. He has occ cough with exertion. He uses albuterol 3-4 x per wk.      HPI Erik Strong 62 y.o. -returns for follow-up.  Last seen in the fall 2023.  He states that his IPF is stable dyspnea stable.  Tolerating pirfenidone well.  99% compliant.  No new medical problems.  His CK was elevated and his statin got changed.  His most recent CK in October 2023 was normal.  In the interim no urgent care visits no emergency room visits no surgeries no admissions.  Symptom scores below.  Weight is below.  They are all stable.  He still continues to smoke marijuana on and off.  He is asking for a letter to avoid jury duty.  I support him in this but he does not have the summons with him.  He is interested in clinical research.  We discussed the possibility of participating in [facility] sponsored bone scans DEXA study.  The study involves getting blood for calcium and  liver function test and also spine x-rays.  He believes he might have osteopenia.  Last bone scan was 10 years ago.  He does not have implants no cancers.  No steroid use.  Have asked the research coordinator to prescreen him.  He is reading the consent form and I gave him the consent form.  The study might be closed for accrual/enrollment.  But we will check with the sponsor. -> lter prescreen faile dbecuase of hardware from # femur in past    SYMPTOM SCALE - ILD 11/12/2020  02/08/2021 Now on esbriet 225# 01/25/2022 231#.  Pirfenidone present 05/06/2022 232#  = esbriet 07/05/2022  10/18/2022 229#e esbriet  O2 use ra ts ra ra ra ra  Shortness of Breath 0 -> 5 scale with 5 being worst (score 6 If unable to do)       At rest 1 0/5 1 0 0 0  Simple tasks - showers, clothes change, eating, shaving '2 1 2 '$ 0.'5 1 1  '$ Household (dishes, doing bed,  laundry) '2 2 3 '$ 0/'5 2 1  '$ Shopping '3 3 2 1 2 2  '$ Walking level at own pace '1 2 2 '$ 1.'5 1 2  '$ Walking up Stairs '3 3 3 '$ 2.'5 3 3  '$ Total (30-36) Dyspnea Score 12 11.'5 13 6 9 9  '$ How bad is your cough? 2 1.'5 2 1 1 1  '$ How bad is your fatigue '2 3 3 2 2 3  '$ How bad is nausea 0 0.'5 2 1 1 2  '$ How bad is vomiting?  0 0 0 0 0 0  How bad is diarrhea? 0 0 0 0 0 0  How bad is anxiety? '2 1 2 2 1 2  '$ How bad is depression 1 0 0 0 '1 1  6    '$ Simple office walk 185 feet x  3 laps goal with forehead probe 11/12/2020  /yf  01/25/2022   O2 used ra ra ra  Number laps completed '3 3 3  '$ Comments about pace Slow pace Mod pae   Resting Pulse Ox/HR 96% and 71/min 100% and 73 100% ad HR 59  Final Pulse Ox/HR 95% and 85/min 97% and 93/,o 99% and HR 76  Desaturated </= 88% no no   Desaturated <= 3% points no Yes, 3   Got Tachycardic >/= 90/min no Myes   Symptoms at end of test Some dsypnea last lap Mild-mod dyspnea   Miscellaneous comments x x stable      PFT     Latest Ref Rng & Units 10/18/2022    9:06 AM 05/06/2022    1:59 PM 10/21/2019    1:53 PM  PFT Results  FVC-Pre L 4.41  P 4.41  4.59   FVC-Predicted Pre % 83  P 81  84   FVC-Post L   4.72   FVC-Predicted Post %   86   Pre FEV1/FVC % % 81  P 79  82   Post FEV1/FCV % %   79   FEV1-Pre L 3.56  P 3.50  3.76   FEV1-Predicted Pre % 88  P 85  90   FEV1-Post L   3.75   DLCO uncorrected ml/min/mmHg 21.52  P 20.68  27.70   DLCO UNC% % 71  P 66  88   DLCO corrected ml/min/mmHg 21.52  P 20.68  27.11   DLCO COR %Predicted % 71  P 66  86   DLVA Predicted % 75  P 83  91   TLC  L   7.98   TLC % Predicted %   103   RV % Predicted %   140     P Preliminary result       has a past medical history of ADD (attention deficit disorder), Anxiety, Arthritis, Chronic pain, History of kidney stones, and Temporal head injury.   reports that he quit smoking about 5 years ago. His smoking use included cigarettes. He started smoking about 45 years ago. He has a 84.00  pack-year smoking history. He has never used smokeless tobacco.  Past Surgical History:  Procedure Laterality Date   HIP SURGERY     I & D EXTREMITY Left 09/02/2019   Procedure: IRRIGATION AND DEBRIDEMENT HAND;  Surgeon: Milly Jakob, MD;  Location: [facility];  Service: Orthopedics;  Laterality: Left;   TOOTH EXTRACTION Bilateral 12/30/2016   Procedure: EXTRACTION MOLARS;  Surgeon: Diona Browner, DDS;  Location: [facility];  Service: Oral Surgery;  Laterality: Bilateral;  Extraction of teeth four, five, twenty-six, thirty; removal of bilateral mandibular lingual tori    Allergies  Allergen Reactions   Other Other (See Comments)   Codeine Nausea And Vomiting   Macrodantin [Nitrofurantoin Macrocrystal] Rash   Shellfish Allergy Nausea And Vomiting    Immunization History  Administered Date(s) Administered   Influenza Split 05/10/2018, 05/04/2019, 09/08/2020   Influenza,inj,Quad PF,6+ Mos 09/09/2016, 06/15/2017, 05/10/2018, 05/04/2019, 07/05/2022   PFIZER(Purple Top)SARS-COV-2 Vaccination 12/16/2019, 01/08/2020, 09/08/2020   PNEUMOCOCCAL CONJUGATE-20 11/22/2021   Respiratory Syncytial Virus Vaccine,Recomb Aduvanted(Arexvy) 07/05/2022   Td 07/10/2006   Tdap 05/14/2012, 12/03/2014   Zoster Recombinat (Shingrix) 05/08/2019   Zoster, Live 05/08/2019, 05/20/2020    Family History  Problem Relation Age of Onset   Prostate cancer Father    CAD Maternal Uncle      Current Outpatient Medications:    acetaminophen (TYLENOL) 325 MG tablet, Take 2 tablets (650 mg total) by mouth every 6 (six) hours., Disp: , Rfl:    albuterol (VENTOLIN HFA) 108 (90 Base) MCG/ACT inhaler, TAKE 2 PUFFS BY MOUTH EVERY 6 HOURS AS NEEDED FOR WHEEZE OR SHORTNESS OF BREATH, Disp: 6.7 each, Rfl: 3   ALPRAZolam (XANAX) 0.5 MG tablet, Take 0.5 mg by mouth at bedtime., Disp: , Rfl:    amphetamine-dextroamphetamine (ADDERALL XR) 30 MG 24 hr capsule, Take 30 mg by mouth daily., Disp: , Rfl:    amphetamine-dextroamphetamine  (ADDERALL) 15 MG tablet, take 2 tablets by mouth in the morning., Disp: , Rfl:    aspirin EC 81 MG tablet, Take 1 tablet (81 mg total) by mouth daily. Swallow whole., Disp: 90 tablet, Rfl: 3   atorvastatin (LIPITOR) 20 MG tablet, Take 1 tablet (20 mg total) by mouth daily., Disp: 90 tablet, Rfl: 3   budesonide-formoterol (SYMBICORT) 160-4.5 MCG/ACT inhaler, Inhale 2 puffs into the lungs 2 (two) times daily., Disp: 1 each, Rfl: 6   ezetimibe (ZETIA) 10 MG tablet, Take 1 tablet (10 mg total) by mouth daily., Disp: 90 tablet, Rfl: 3   levothyroxine (SYNTHROID) 50 MCG tablet, Take 50 mcg by mouth daily before breakfast., Disp: , Rfl:    omeprazole (PRILOSEC) 40 MG capsule, Take 1 capsule (40 mg total) by mouth daily., Disp: 90 capsule, Rfl: 3   phenylephrine-shark liver oil-mineral oil-petrolatum (PREPARATION H) 0.25-3-14-71.9 % rectal ointment, Place 1 application rectally daily as needed for hemorrhoids., Disp: , Rfl:    Pirfenidone (ESBRIET) 801 MG TABS, Take 1 tablet (801 mg) by mouth in the morning, at noon, and at bedtime., Disp: 270 tablet, Rfl:  1   sildenafil (REVATIO) 20 MG tablet, Take 20 mg by mouth daily as needed (FOR ED). , Disp: , Rfl:    tiZANidine (ZANAFLEX) 2 MG tablet, Take 2 mg by mouth at bedtime., Disp: , Rfl:    traMADol (ULTRAM) 50 MG tablet, Take 50 mg by mouth See admin instructions. Take one tablet (50 mg) by mouth daily at bedtime, may also take one tablet (50 mg) twice daily as needed for pain, Disp: , Rfl:       Objective:   Vitals:   10/18/22 0953  BP: 122/68  Pulse: 64  Temp: 98.1 F (36.7 C)  TempSrc: Oral  SpO2: 98%  Weight: 229 lb (103.9 kg)  Height: '6\' 1"'$  (1.854 m)    Estimated body mass index is 30.21 kg/m as calculated from the following:   Height as of this encounter: '6\' 1"'$  (1.854 m).   Weight as of this encounter: 229 lb (103.9 kg).  '@WEIGHTCHANGE'$ @  [employer]   10/18/22 0953  Weight: 229 lb (103.9 kg)     Physical Exam    General:  No distress. Looks well Neuro: Alert and Oriented x 3. GCS 15. Speech normal Psych: Pleasant Resp:  Barrel Chest - no.  Wheeze - no, Crackles - yes at base, No overt respiratory distress CVS: Normal heart sounds. Murmurs - no.  ABD: Visceral Obesity + Ext: Stigmata of Connective Tissue Disease - no HEENT: Normal upper airway. PEERL +. No post nasal drip        Assessment:       ICD-10-CM   1. IPF (idiopathic pulmonary fibrosis) (HCC)  J84.112 CK (Creatine Kinase)    CBC w/Diff    Basic Metabolic Panel (BMET)    Hepatic function panel    Hepatic function panel    Basic Metabolic Panel (BMET)    CBC w/Diff    CK (Creatine Kinase)    CANCELED: CKMB    CANCELED: CKMB    2. Medication monitoring encounter  Z51.81 CK (Creatine Kinase)    CBC w/Diff    Basic Metabolic Panel (BMET)    Hepatic function panel    Hepatic function panel    Basic Metabolic Panel (BMET)    CBC w/Diff    CK (Creatine Kinase)    CANCELED: CKMB    CANCELED: CKMB         Plan:     Patient Instructions   #Pulmonary fibrosis - IPF  -clinically stable since Aug 2023 but worse sinc 2021. Esbriet since early 2022 helping - handling esbriet well   Plan  - continue esbriet - check cbc, bmet, LFT  10/18/2022 -advse for you to quit MJ to protect your lungs - take consent for [place] fellow 10/18/2022 - do Spirometry and dlco in 5-6  months  Marijuana use  - helps nausea but in long term damanging to lungs  Plan - quit MJ use   Emphysema - alpha 1 MM   -- stable disease on symbicort  Plan - continue symbicort scheduled with albuterol as neede   Elevated CK - noted FEb 2022  and Aug 2023 ? Drug related but normal Oct 2023  Plan - recheck CK and CK-MB 10/18/2022  Others  Plan  - you should not do jury duty du eto lung disease;   - can do letter when you bring the summons  Followup  - 5-6 months with Dr Chase Caller - 30 min slot but after  PFT   Moderate Complexity MDM NEW OFFICE  The table below is from the 2021 E/M guidelines, first released in 2021, with minor revisions added in 2023. Must meet the requirements for 2 out of 3 dimensions to qualify.    Number and complexity of problems addressed Amount and/or complexity of data reviewed Risk of complications and/or morbidity  One or more chronic illness with mild exacerbation, progression, or side effects of treatment  Two or more stable chronic illnesses  One undiagnosed new problem with uncertain prognosis  One acute illness with systemic symptoms   Acute complicated injury Must meet the requirements for 1 of 3 of the categories)  Category 1: Tests and documents, historian  Any combination of 3 of the following:  Assessment requiring an independent historian  Review of prior external records  Review of results of each unique test  Ordering of each unique test    Category 2: Interpretation of tests  Independent interpretation of a test perfromed by another physician/NPP  Category 3: Discuss management/tests  Discussion of magagement or tests with an external physician/NPP Prescription drug management  Decision regarding minor surgery with identfied patient or procedure risk factors  Decision regarding elective major surgery without identified patient or procedure risk factors  Diagnosis or treatment significantly limited by social determinants of health              SIGNATURE    Dr. Brand Males, M.D., F.C.C.P,  Pulmonary and Critical Care Medicine Staff Physician, [place] Director - Interstitial Lung Disease  Program  Pulmonary [facility] [place], [place], [address]  Pager: [phone], If no answer or between  15:00h - 7:00h: call [phone] Telephone: [phone]  10:29 AM 10/18/2022

## 2022-10-18 NOTE — Progress Notes (Signed)
Spirometry and DLCO Performed Today.  

## 2022-10-19 ENCOUNTER — Telehealth: Payer: Self-pay | Admitting: Internal Medicine

## 2022-10-19 LAB — CREATININE KINASE MB: CK-MB Index: 3.1 ng/mL (ref 0.0–10.4)

## 2022-10-19 NOTE — Telephone Encounter (Signed)
Spoke to patient.  He is requesting a letter to excuse him from jury duty. He would like letter to be emailed to [email or web address].  He does not have a summons number, as he misplaced his summons.   Dr. Chase Caller, please advise. Thanks

## 2022-10-20 NOTE — Telephone Encounter (Signed)
Called and left voicemail for patient to call office back about jury duty letter

## 2022-10-20 NOTE — Telephone Encounter (Signed)
To WHom It may concern  Re: Jury Duty for [name] with Date of Birth: 04-20-1961 and residing at : [address] [place] [address]   [name] should hereby be permanently excused from jury duty. 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 Attending Jury duty will  be harmful to his physical and emotional health  Please do not hesitate to reach me if you have any questions  Sincerely yours,      SIGNATURE    Dr. Brand Males, M.D., F.C.C.P,  Pulmonary and Critical Care Medicine Staff Physician, [place] Director - Interstitial Lung Disease  Program  Medical Director - [facility] [place] [facility] [place], [place], [address] Telephone (clinical office): [phone] Telephone (research): [phone]  6:48 AM 10/20/2022

## 2022-10-24 ENCOUNTER — Other Ambulatory Visit: Payer: Self-pay

## 2022-10-24 MED ORDER — ATORVASTATIN CALCIUM 20 MG PO TABS: 20.0000 mg | ORAL_TABLET | Freq: Every day | ORAL | 3 refills | Status: DC

## 2022-10-25 ENCOUNTER — Encounter: Payer: Self-pay | Admitting: *Deleted

## 2022-10-25 NOTE — Telephone Encounter (Signed)
Spoke with the pt  Letter done and placed up front for pick up per pt request  Nothing further needed

## 2022-11-15 ENCOUNTER — Other Ambulatory Visit: Payer: Self-pay

## 2022-11-15 DIAGNOSIS — E785 Hyperlipidemia, unspecified: Secondary | ICD-10-CM

## 2022-11-15 DIAGNOSIS — R7989 Other specified abnormal findings of blood chemistry: Secondary | ICD-10-CM

## 2022-11-15 DIAGNOSIS — Z79899 Other long term (current) drug therapy: Secondary | ICD-10-CM

## 2022-11-15 NOTE — Progress Notes (Signed)
Plan: per Erik Strong, [name] at 10/04/2022 OV.  Due to the minimal increase in CK, I feel its appropriate to re-challenge with statin. Will start with moderate intensity statin this time. Start atorvastatin 20 mg daily, continue ezetimibe 10 mg daily Recheck lipids CK and LFTs in 2 months

## 2022-11-23 DIAGNOSIS — N183 Chronic kidney disease, stage 3 unspecified: Secondary | ICD-10-CM | POA: Diagnosis not present

## 2022-11-23 DIAGNOSIS — I700 Atherosclerosis of aorta: Secondary | ICD-10-CM | POA: Diagnosis not present

## 2022-11-23 DIAGNOSIS — J439 Emphysema, unspecified: Secondary | ICD-10-CM | POA: Diagnosis not present

## 2022-11-23 DIAGNOSIS — Z23 Encounter for immunization: Secondary | ICD-10-CM | POA: Diagnosis not present

## 2022-11-23 DIAGNOSIS — E039 Hypothyroidism, unspecified: Secondary | ICD-10-CM | POA: Diagnosis not present

## 2022-11-23 DIAGNOSIS — J84112 Idiopathic pulmonary fibrosis: Secondary | ICD-10-CM | POA: Diagnosis not present

## 2022-11-24 ENCOUNTER — Encounter: Payer: Self-pay | Admitting: Radiology

## 2022-11-29 ENCOUNTER — Ambulatory Visit: Payer: Medicare Other | Attending: Cardiology

## 2022-11-29 DIAGNOSIS — E785 Hyperlipidemia, unspecified: Secondary | ICD-10-CM | POA: Diagnosis not present

## 2022-11-29 DIAGNOSIS — R7989 Other specified abnormal findings of blood chemistry: Secondary | ICD-10-CM

## 2022-11-29 DIAGNOSIS — Z79899 Other long term (current) drug therapy: Secondary | ICD-10-CM | POA: Diagnosis not present

## 2022-11-29 LAB — HEPATIC FUNCTION PANEL
ALT: 55 IU/L — ABNORMAL HIGH (ref 0–44)
AST: 29 IU/L (ref 0–40)
Albumin: 4.2 g/dL (ref 3.8–4.9)
Alkaline Phosphatase: 81 IU/L (ref 44–121)
Bilirubin Total: 0.3 mg/dL (ref 0.0–1.2)
Bilirubin, Direct: 0.11 mg/dL (ref 0.00–0.40)
Total Protein: 6.9 g/dL (ref 6.0–8.5)

## 2022-11-29 LAB — LIPID PANEL
Chol/HDL Ratio: 3 ratio (ref 0.0–5.0)
Cholesterol, Total: 135 mg/dL (ref 100–199)
HDL: 45 mg/dL (ref >39)
LDL Chol Calc (NIH): 68 mg/dL (ref 0–99)
Triglycerides: 120 mg/dL (ref 0–149)
VLDL Cholesterol Cal: 22 mg/dL (ref 5–40)

## 2022-11-29 LAB — CK: Total CK: 156 U/L (ref 41–331)

## 2022-11-30 NOTE — Addendum Note (Signed)
Addended by: Marcelle Overlie D on: 11/30/2022 08:00 AM   Modules accepted: Orders

## 2022-12-13 ENCOUNTER — Other Ambulatory Visit: Payer: Self-pay | Admitting: Pharmacist

## 2022-12-13 DIAGNOSIS — J84112 Idiopathic pulmonary fibrosis: Secondary | ICD-10-CM

## 2022-12-13 MED ORDER — PIRFENIDONE 801 MG PO TABS: 801.0000 mg | ORAL_TABLET | Freq: Three times a day (TID) | ORAL | 1 refills | Status: DC

## 2022-12-13 NOTE — Telephone Encounter (Signed)
Refill sent for ESBRIET to [facility] ([facility]) for Esbriet: [phone]  Dose: 801 mg three times daily  Last OV: 10/18/22 Provider: Dr. Chase Caller  Next OV: 5-6 months (not yet scheduled)  LFTs on 11/29/22 - stable  Knox Saliva, PharmD, MPH, BCPS Clinical Pharmacist (Rheumatology and Pulmonology)

## 2022-12-19 DIAGNOSIS — N183 Chronic kidney disease, stage 3 unspecified: Secondary | ICD-10-CM | POA: Diagnosis not present

## 2023-02-25 ENCOUNTER — Other Ambulatory Visit: Payer: Self-pay | Admitting: Internal Medicine
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
## 2023-03-14 ENCOUNTER — Encounter: Payer: Self-pay | Admitting: Cardiology

## 2023-03-14 ENCOUNTER — Ambulatory Visit: Payer: Medicare Other | Attending: Cardiology | Admitting: Cardiology

## 2023-03-14 ENCOUNTER — Ambulatory Visit: Payer: Medicare Other

## 2023-03-14 VITALS — BP 134/78 | HR 62 | Ht 73.0 in | Wt 227.0 lb

## 2023-03-14 DIAGNOSIS — E785 Hyperlipidemia, unspecified: Secondary | ICD-10-CM

## 2023-03-14 DIAGNOSIS — R7989 Other specified abnormal findings of blood chemistry: Secondary | ICD-10-CM | POA: Diagnosis not present

## 2023-03-14 DIAGNOSIS — K921 Melena: Secondary | ICD-10-CM

## 2023-03-14 DIAGNOSIS — I2510 Atherosclerotic heart disease of native coronary artery without angina pectoris: Secondary | ICD-10-CM | POA: Diagnosis not present

## 2023-03-14 DIAGNOSIS — J849 Interstitial pulmonary disease, unspecified: Secondary | ICD-10-CM | POA: Diagnosis not present

## 2023-03-14 NOTE — Patient Instructions (Signed)
Medication Instructions:   Your physician recommends that you continue on your current medications as directed. Please refer to the Current Medication list given to you today.  *If you need a refill on your cardiac medications before your next appointment, please call your pharmacy*    Follow-Up: At [facility], you and your health needs are our priority.  As part of our continuing mission to provide you with exceptional heart care, we have created designated Provider Care Teams.  These Care Teams include your primary Cardiologist (physician) and Advanced Practice Providers (APPs -  Physician Assistants and Nurse Practitioners) who all work together to provide you with the care you need, when you need it.  We recommend signing up for the patient portal called "MyChart".  Sign up information is provided on this After Visit Summary.  MyChart is used to connect with patients for Virtual Visits (Telemedicine).  Patients are able to view lab/test results, encounter notes, upcoming appointments, etc.  Non-urgent messages can be sent to your provider as well.   To learn more about what you can do with MyChart, go to [email or web address].    Your next appointment:   6 month(s)  Provider:   DR. SCHUMANN  

## 2023-03-15 LAB — HEPATIC FUNCTION PANEL
ALT: 42 IU/L (ref 0–44)
AST: 28 IU/L (ref 0–40)
Albumin: 4.3 g/dL (ref 3.8–4.9)
Alkaline Phosphatase: 76 IU/L (ref 44–121)
Bilirubin Total: <0.2 mg/dL (ref 0.0–1.2)
Bilirubin, Direct: <0.1 mg/dL (ref 0.00–0.40)
Total Protein: 6.7 g/dL (ref 6.0–8.5)

## 2023-03-28 ENCOUNTER — Other Ambulatory Visit: Payer: Self-pay | Admitting: Pharmacist

## 2023-03-28 DIAGNOSIS — J84112 Idiopathic pulmonary fibrosis: Secondary | ICD-10-CM

## 2023-03-28 MED ORDER — PIRFENIDONE 801 MG PO TABS: 801.0000 mg | ORAL_TABLET | Freq: Three times a day (TID) | ORAL | 1 refills | Status: DC

## 2023-03-28 NOTE — Telephone Encounter (Signed)
Refill sent for ESBRIET to [facility]) for Esbriet: [phone]  Dose: 801 mg three times daily  Last OV: 10/18/2022 Provider: Dr/ Marchelle Gearing  Next OV: due in 5-6 months but not yet scheduled. Routing to scheduling team  LFTs on 03/14/23 wnl  Chesley Mires, PharmD, MPH, BCPS Clinical Pharmacist (Rheumatology and Pulmonology)

## 2023-04-26 ENCOUNTER — Ambulatory Visit: Payer: Medicare Other | Admitting: Internal Medicine

## 2023-05-22 ENCOUNTER — Other Ambulatory Visit: Payer: Self-pay | Admitting: Internal Medicine

## 2023-06-01 DIAGNOSIS — I700 Atherosclerosis of aorta: Secondary | ICD-10-CM | POA: Diagnosis not present

## 2023-06-01 DIAGNOSIS — E039 Hypothyroidism, unspecified: Secondary | ICD-10-CM | POA: Diagnosis not present

## 2023-06-01 DIAGNOSIS — N183 Chronic kidney disease, stage 3 unspecified: Secondary | ICD-10-CM | POA: Diagnosis not present

## 2023-06-01 DIAGNOSIS — E7800 Pure hypercholesterolemia, unspecified: Secondary | ICD-10-CM | POA: Diagnosis not present

## 2023-06-01 DIAGNOSIS — Z0000 Encounter for general adult medical examination without abnormal findings: Secondary | ICD-10-CM | POA: Diagnosis not present

## 2023-06-01 DIAGNOSIS — D179 Benign lipomatous neoplasm, unspecified: Secondary | ICD-10-CM | POA: Diagnosis not present

## 2023-06-01 DIAGNOSIS — J439 Emphysema, unspecified: Secondary | ICD-10-CM | POA: Diagnosis not present

## 2023-06-01 DIAGNOSIS — J84112 Idiopathic pulmonary fibrosis: Secondary | ICD-10-CM | POA: Diagnosis not present

## 2023-06-01 DIAGNOSIS — Z23 Encounter for immunization: Secondary | ICD-10-CM | POA: Diagnosis not present

## 2023-06-22 ENCOUNTER — Telehealth: Payer: Self-pay | Admitting: Pharmacist

## 2023-06-22 NOTE — Telephone Encounter (Signed)
Received fax from [place] that patient is approved to continue receiving Esbriet free of charge in 2025.   [facility] phone: [phone] [facility] Phone: [phone]  Pt overdue for f/u apppt  Chesley Mires, PharmD, MPH, BCPS, CPP Clinical Pharmacist (Rheumatology and Pulmonology)

## 2023-07-04 ENCOUNTER — Ambulatory Visit: Payer: Medicare Other | Admitting: Internal Medicine

## 2023-07-18 ENCOUNTER — Other Ambulatory Visit (HOSPITAL_BASED_OUTPATIENT_CLINIC_OR_DEPARTMENT_OTHER): Payer: Self-pay

## 2023-07-18 DIAGNOSIS — J84112 Idiopathic pulmonary fibrosis: Secondary | ICD-10-CM

## 2023-07-18 DIAGNOSIS — Z5181 Encounter for therapeutic drug level monitoring: Secondary | ICD-10-CM

## 2023-07-19 ENCOUNTER — Encounter (HOSPITAL_BASED_OUTPATIENT_CLINIC_OR_DEPARTMENT_OTHER): Payer: Medicare Other

## 2023-07-20 ENCOUNTER — Ambulatory Visit: Payer: Medicare Other | Admitting: Internal Medicine

## 2023-07-27 ENCOUNTER — Other Ambulatory Visit: Payer: Self-pay

## 2023-07-27 MED ORDER — EZETIMIBE 10 MG PO TABS: 10.0000 mg | ORAL_TABLET | Freq: Every day | ORAL | 0 refills | Status: DC

## 2023-07-27 MED ORDER — ATORVASTATIN CALCIUM 20 MG PO TABS: 20.0000 mg | ORAL_TABLET | Freq: Every day | ORAL | 0 refills | Status: DC

## 2023-08-24 ENCOUNTER — Encounter: Payer: Self-pay | Admitting: Internal Medicine

## 2023-08-24 ENCOUNTER — Ambulatory Visit: Payer: Medicare Other | Admitting: Internal Medicine

## 2023-08-24 ENCOUNTER — Ambulatory Visit (HOSPITAL_BASED_OUTPATIENT_CLINIC_OR_DEPARTMENT_OTHER): Payer: Medicare Other | Admitting: Internal Medicine

## 2023-08-24 VITALS — BP 120/76 | HR 87 | Ht 73.0 in | Wt 233.0 lb

## 2023-08-24 DIAGNOSIS — J84112 Idiopathic pulmonary fibrosis: Secondary | ICD-10-CM

## 2023-08-24 LAB — PULMONARY FUNCTION TEST
DL/VA % pred: 83 %
DL/VA: 3.47 ml/min/mmHg/L
DLCO cor % pred: 75 %
DLCO cor: 22.58 ml/min/mmHg
DLCO unc % pred: 75 %
DLCO unc: 22.58 ml/min/mmHg
FEF 25-75 Pre: 3.61 L/s
FEF2575-%Pred-Pre: 112 %
FEV1-%Pred-Pre: 88 %
FEV1-Pre: 3.51 L
FEV1FVC-%Pred-Pre: 106 %
FEV6-%Pred-Pre: 86 %
FEV6-Pre: 4.33 L
FEV6FVC-%Pred-Pre: 104 %
FVC-%Pred-Pre: 82 %
FVC-Pre: 4.36 L
Pre FEV1/FVC ratio: 80 %
Pre FEV6/FVC Ratio: 100 %

## 2023-08-24 NOTE — Progress Notes (Signed)
Spirometry and DLCO Performed Today.  

## 2023-08-24 NOTE — Patient Instructions (Addendum)
#  Pulmonary fibrosis - IPF  -clinically stable as of 08/24/2023  since Aug 2023 but worse sinc 2021. Esbriet since early 2022 helping - handling esbriet well  - discussed transplant -   Plan  - continue esbriet - check LFT 08/24/2023 -advse for you to quit MJ to protect your lungs and consider clinical trials - lose weight and get fit - do Spirometry and dlco in 6  months  Marijuana use  - helps nausea but in long term damanging to lungs  Plan - quit MJ use    Followup  - 6 months with Dr Marchelle Gearing - 15 min slot but after PFT

## 2023-08-24 NOTE — Patient Instructions (Signed)
Spirometry and DLCO Performed Today.  

## 2023-08-24 NOTE — Progress Notes (Signed)
OV 11/12/2020  Subjective:  Patient ID: Erik Strong, male , DOB: 09/29/1960 , age 62 y.o. , MRN: [id number] , ADDRESS: [address] [place] [address] PCP Clovis Riley, L.August Saucer, MD Patient Care Team: Clovis Riley, Elbert Ewings.August Saucer, MD as PCP - General (Family Medicine)  This Provider for this visit: Treatment Team:  Attending Provider: Kalman Shan, MD    11/12/2020 -   Chief Complaint  Patient presents with   New Patient (Initial Visit)    Doing ok, winded at times     HPI Erik Strong 62 y.o. -referred by Dr. Sandrea Hughs to the ILD center because of discovery of pulmonary fibrosis he also has associated emphysema.  He says he has been disabled because of back issues.  He says he has severe ADD and sometimes he will have to search for key for 2 hours.  He says he can get overwhelmed easily and it might take multiple visits for him to fully grasp disease discussion and therapeutic management plan and prognosis.  [place] Integrated Comprehensive ILD Questionnaire  Symptoms: Insidious onset of shortness of breath gradually getting worse for the last several years.  Episodic dyspnea present severity scale below.  He does have difficulty keeping up with others of his age.  He does have some arthralgia.  He does have cough he coughs at night he brings up some phlegm.  It is whitish-green.  He does clear his throat he does feel this a tickle in the back of the throat occasional wheezing.  No nausea no vomiting no diarrhea.    Past Medical History : As below but no collagen vascular disease or vasculitis.  Does have chronic kidney disease no history of blood clots   ROS:   Does have fatigue arthralgia he does have cracked skin in his fingers.  Does have arthralgia.  Also has daytime somnolence.  He is not able to use his CPAP no recurrent fever no weight loss.  Does have  FAMILY HISTORY of LUNG DISEASE:   Denies   EXPOSURE HISTORY: Between 1978 and 2018 he smoked 30 cigarettes/day  and then quit.  Does not smoke cigars no smoking pipes no vaping.  Current marijuana user.  In 2003 use some cocaine.  HOME and HOBBY DETAILS : [place]  He is lived in the home for 50 years.  The home itself is 06/06/1961 built and is 61 years old.  There is dampness in the basement he does do some occasional yard work and Geographical information systems officer but otherwise no exposure to mold organic antigens he does have cats and dogs   OCCUPATIONAL HISTORY (122 questions) : He has done work with damp air-conditioned spaces.  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  Done pest control work.  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  Done carpentry done wood trimming.  Done oil heating done asbestos work done [employer] work.  Done epoxy reasons.  Then would work done Environmental manager.  Done plastic work.  Done insulation work.  Done metal grinding done machine operating done industrial work.  Done lack ring done spray painting.  Done waterproofing done 5 working.  Done flooring.  Done metal legs.  Done Printmaker.  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  Narrative & Impression  CLINICAL DATA:  62 year old male with history of asbestos exposure. Intermittent wheezing. Former smoker (quit 4 years ago). Suspected interstitial lung disease.   EXAM: CT CHEST WITHOUT CONTRAST   TECHNIQUE: Multidetector CT imaging of the chest was performed following the standard protocol without intravenous contrast. High resolution imaging of the lungs, as well as inspiratory and expiratory imaging, was performed.   COMPARISON:  No priors.    FINDINGS: Cardiovascular: Heart size is normal. There is no significant pericardial fluid, thickening or pericardial calcification. There is aortic atherosclerosis, as well as atherosclerosis of the great vessels of the mediastinum and the coronary arteries, including calcified atherosclerotic plaque in the left anterior descending and right coronary arteries.   Mediastinum/Nodes: No pathologically enlarged mediastinal or hilar lymph nodes. Please note that accurate exclusion of hilar adenopathy is limited on noncontrast CT scans. Esophagus is unremarkable in appearance. No axillary lymphadenopathy.   Lungs/Pleura: High-resolution images demonstrate patchy regions of ground-glass attenuation, septal thickening, subpleural reticulation, traction bronchiectasis and frank honeycombing. Findings have a craniocaudal gradient. Inspiratory and expiratory imaging is unremarkable. No acute consolidative airspace disease. No pleural effusions. No definite suspicious appearing pulmonary nodules or masses are noted. Mild diffuse bronchial wall thickening with mild centrilobular and paraseptal emphysema.   Upper Abdomen: Low-attenuation adrenal nodules bilaterally measuring 2.9 x 1.9 cm on the right (6 HU) and 2.0 x 1.6 cm on the left (3 HU), compatible with benign adrenal adenomas. Aortic atherosclerosis.   Musculoskeletal: There are no aggressive appearing lytic or blastic lesions noted in the visualized portions of the skeleton.   IMPRESSION: 1. 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 2. There is also mild diffuse bronchial wall thickening with mild centrilobular and paraseptal emphysema; imaging findings suggestive of underlying COPD. 3. Aortic atherosclerosis, in addition to left anterior descending and right coronary artery disease. Please note  that although the presence of coronary artery calcium documents the presence of coronary artery disease, the severity of this disease and any potential stenosis cannot be assessed on this non-gated CT examination. Assessment for potential risk factor modification, dietary therapy or pharmacologic therapy may be warranted, if clinically indicated. 4. Bilateral adrenal adenomas, as above.   Aortic Atherosclerosis (ICD10-I70.0) and Emphysema (ICD10-J43.9).     Electronically Signed   By: Trudie Reed M.D.   On: 09/25/2020 14:33      PFT  PFT Results Latest Ref Rng & Units 10/21/2019  FVC-Pre L 4.59  FVC-Predicted Pre % 84  FVC-Post L 4.72  FVC-Predicted Post % 86  Pre FEV1/FVC % % 82  Post FEV1/FCV % % 79  FEV1-Pre L 3.76  FEV1-Predicted Pre % 90  FEV1-Post L 3.75  DLCO uncorrected ml/min/mmHg 27.70  DLCO UNC% % 88  DLCO corrected ml/min/mmHg 27.11  DLCO COR %Predicted % 86  DLVA Predicted % 91  TLC L 7.98  TLC % Predicted % 103  RV % Predicted % 140    Results for ULRICH, CHAU (MRN [id number]) as of 11/12/2020 11:00  Ref. Range 09/07/2020 23:06  Creatinine Latest Ref Range: 0.61 - 1.24 mg/dL 4.01 (H)  Results for RADLEY, TWING (MRN [id number]) as of 11/12/2020 11:00  Ref. Range 09/07/2020 23:06  BASIC METABOLIC PANEL Unknown Rpt (A)  Sodium Latest Ref Range: 135 -  145 mmol/L 139  Potassium Latest Ref Range: 3.5 - 5.1 mmol/L 4.1  Chloride Latest Ref Range: 98 - 111 mmol/L 100  CO2 Latest Ref Range: 22 - 32 mmol/L 29  Glucose Latest Ref Range: 70 - 99 mg/dL 253 (H)      Creatinine Latest Ref Range: 0.61 - 1.24 mg/dL 6.64 (H)  Calcium Latest Ref Range: 8.9 - 10.3 mg/dL 9.3  Anion gap Latest Ref Range: 5 - 15  10  GFR, Estimated Latest Ref Range: >60 mL/min 49 (L)  Results for TRACI, KINGSBURY (MRN [id number]) as of 11/12/2020 11:00  Ref. Range 09/07/2020 23:06  Hemoglobin Latest Ref Range: 13.0 - 17.0 g/dL 56.3    OV 8/75/6433  Subjective:  Patient ID: Erik Strong, male , DOB: 1961/04/19 , age 18 y.o. , MRN: [id number] , ADDRESS: [address] [place] [address] PCP Clovis Riley, L.August Saucer, MD Patient Care Team: Clovis Riley, Elbert Ewings.August Saucer, MD as PCP - General (Family Medicine)  This Provider for this visit: Treatment Team:  Attending Provider: Kalman Shan, MD    02/08/2021 -   Chief Complaint  Patient presents with   Follow-up    Pt states he has been doing okay since last visit. States he still becomes SOB with exertion.   Follow-up idiopathic pulmonary fibrosis `- Diagnoses given 11/13/2020 [myositis panel negative but CK and aldolase slightly elevated]  -Clinical diagnosis based on male gender, classic UIP and serology only being trace positive although age only 39 and prior smoking history  - Last PFT February 2021 -Last high-resolution CT January 2022 -Started pirfenidone end of March 2022   Associated emphysema alpha-1 MM  -On Symbicort  Associated significant attention deficit disorder  Normal cardiac stress test March 2022  HPI Erik Strong 62 y.o. -returns for follow-up.  He is now on pirfenidone since end of March 2022.  He saying he is tolerating it fine except for occasional nausea that is very mild.  There are no real problems.  Overall he is feeling good.  Today he is wearing sleeveless T-shirt and shorts.  He has a history of skin cancer.  He says he is not putting or applying sunscreen although he notes that he needs to.  He is now on pirfenidone as well.  I cautioned him about this.  His last liver function test was in February 2022.  He will have his liver function test today and he is agreed.  His symptom score and walking desaturation test appears stable.  His last pulmonary function test was over a year ago and he is willing to have this scheduled prior to the next visit.   OV 01/25/2022  Subjective:  Patient ID: Erik Strong, male , DOB: 03-09-1961 , age 49 y.o. , MRN: [id number] , ADDRESS: [address] [place] [address] PCP Clovis Riley, L.August Saucer, MD Patient Care Team: Clovis Riley, Elbert Ewings.August Saucer, MD as PCP - General (Family Medicine)  This Provider for this visit: Treatment Team:  Attending Provider: Kalman Shan, MD    01/25/2022 -   Chief Complaint  Patient presents with   Follow-up    Pt states he has been doing okay since last visit. States his breathing is about the same.    HPI Erik Strong 62 y.o. -returns for follow-up.  It has been a year since I last saw him after that he saw Dr. Sherene Sires in August 2022 and not followed up.  He continues his pirfenidone although some 7 weeks ago he started running  out of it and started taking it only 1 or 2 tablets a day.  And then was completely out of it for 1 week.  Now for the last 2 weeks after getting his insurance paperwork sorted out he has been taking it at full dose.  He is tolerating it well.  There is no sunburn or any other side effect.  The main issue is that he does have nausea but no vomiting.  The nausea is mild.  He has no complaints overall he feels stable.  His last lab work was last year.  His last pulmonary function test was 2 years ago last CT scan of the chest was 1 year ago.  He does not smoke.  He denies any new health issues no changes in medications.  He is on disability for his hip and his knee does bother him.  However he does think he can walk a 6-minute walk test.   His symptoms: Walking desaturation appears stable over time.Marland Kitchen  His weight is also stable.  He does have OSA but unable to tolerate     OV 05/06/2022  Subjective:  Patient ID: Erik Strong, male , DOB: [birth year] , age 62 y.o. , MRN: [id number] , ADDRESS: [address] [place] [address] PCP Clovis Riley, L.August Saucer, MD Patient Care Team: Clovis Riley, Elbert Ewings.August Saucer, MD as PCP - General (Family Medicine)  This Provider for this visit: Treatment Team:  Attending Provider: Kalman Shan, MD   05/06/2022 -   Chief Complaint  Patient presents with    Follow-up    PFT performed today.  Pt states he has been doing okay since last visit. States his breathing has been doing okay.     HPI Erik Strong 62 y.o. -returns for follow-up.  At this point in time he is stable on pirfenidone.  His symptoms are minimal.  He does say that when he goes out and son there is some burning but there is no rash.  He has noticed that niacin in the past.  He was wondering if it was because of pirfenidone.  I confirmed the same.  But he does use sunscreen and this brings down the bone intensity and definitely no skin rash at any time or erythema.  He has no new complaints.  He had pulmonary function test shows slight reduction that is probably not clinically significant.  He states that he ran out of Symbicort due to insurance issues few months ago and just ordered it yesterday.  He suspects is because of that.  His symptom score is actually better.  He had high-resolution CT chest in summer 2023 and is stable.  Overall he believes he is stable.   We discussed clinical trials as a care option and coverered the following  CT Chest data  OV 07/05/2022  Subjective:  Patient ID: Erik Strong, male , DOB: [birth year] , age 79 y.o. , MRN: [id number] , ADDRESS: [address] [place] [address] PCP Clovis Riley, L.August Saucer, MD Patient Care Team: Clovis Riley, Elbert Ewings.August Saucer, MD as PCP - General (Family Medicine)  This Provider for this visit: Treatment Team:  Attending Provider: [place]   [address]/17/2023 -   Chief Complaint  Patient presents with   Follow-up    Cough a little worse. Fever 1 week ago.  SOB about the same.  Would like flu vaccine today     HPI Erik Strong 62 y.o. -returns for routine follow-up.  He states he is doing stable.  Infection from  scores are stable.  A few weeks ago he had temperature of 100.5 COVID test was negative.  He did have some slight increased cough but then this settled.  He is back to baseline.  He is tolerating  pirfenidone well except for 1 or 2 minutes of dizziness 30 minutes after taking the pirfenidone it is extremely mild.  His smoking is in remission.  He has not done cocaine in 20 years.  He still smokes marijuana.  He says there is no craving for it he does smoke for fun.  Last use was few days ago.  We spoke about the hazards of smoking marijuana.  He is interested in clinical trials and this will exclude patients who smoke marijuana.  Regardless I have told him about the need to quit marijuana to protect his lung health.  He understands this.  He wants to have his COVID mRNA booster.  He will have it on his own.  This week and is going on a charge trip to [facility].  He will have his RSV and flu shot today.  We discussed this.   For his emphysema: His son Symbicort and continues this  He has slightly unexplained elevated CK.  Last check in August 2023 this was coming down.  We will check this again. OV 10/18/2022  Subjective:  Patient ID: Erik Strong, male , DOB: 1961-01-02 , age 83 y.o. , MRN: [id number] , ADDRESS: [address] [place] [address] PCP Clovis Riley, L.August Saucer, MD Patient Care Team: Clovis Riley, Elbert Ewings.August Saucer, MD as PCP - General (Family Medicine) Meriam Sprague, MD as PCP - Cardiology (Cardiology)  This Provider for this visit: Treatment Team:  Attending Provider: Kalman Shan, MD  10/18/2022 -   Chief Complaint  Patient presents with   Follow-up    PFT done today. Breathing is unchanged since his last visit. He has occ cough with exertion. He uses albuterol 3-4 x per wk.      HPI Erik Strong 62 y.o. -returns for follow-up.  Last seen in the fall 2023.  He states that his IPF is stable dyspnea stable.  Tolerating pirfenidone well.  99% compliant.  No new medical problems.  His CK was elevated and his statin got changed.  His most recent CK in October 2023 was normal.  In the interim no urgent care visits no emergency room visits no surgeries no  admissions.  Symptom scores below.  Weight is below.  They are all stable.  He still continues to smoke marijuana on and off.  He is asking for a letter to avoid jury duty.  I support him in this but he does not have the summons with him.  He is interested in clinical research.  We discussed the possibility of participating in [employer] sponsored bone scans DEXA study.  The study involves getting blood for calcium and liver function test and also spine x-rays.  He believes he might have osteopenia.  Last bone scan was 10 years ago.  He does not have implants no cancers.  No steroid use.  Have asked the research coordinator to prescreen him.  He is reading the consent form and I gave him the consent form.  The study might be closed for accrual/enrollment.  But we will check with the sponsor. -> lter prescreen faile dbecuase of hardware from # femur in past      OV 08/24/2023  Subjective:  Patient ID: Erik Strong, male , DOB: [birth year] , age 9  y.o. , MRN: [id number] , ADDRESS: [address] [place] [address] PCP Irven Coe, MD Patient Care Team: Irven Coe, MD as PCP - General (Family Medicine) Meriam Sprague, MD (Inactive) as PCP - Cardiology (Cardiology)  This Provider for this visit: Treatment Team:  Attending Provider: Kalman Shan, MD    Follow-up idiopathic pulmonary fibrosis `- Diagnoses given 11/13/2020 [myositis panel negative but CK and aldolase slightly elevated]  -Clinical diagnosis based on male gender, classic UIP and serology only being trace positive although age only 89 and prior smoking history  - Last PFT February 2021 -Last high-resolution CT January 2022 -> June 2023 is stable -Started pirfenidone end of March 2022   Associated emphysema alpha-1 MM  -On Symbicort  Associated significant attention deficit disorder  OSA unable to tolerate CPAP  Normal cardiac stress test March 2022. Last echo April 2022  Current active marijuana  smoker -mild, occ but active as of 08/24/2023   08/24/2023 -   Chief Complaint  Patient presents with   Follow-up    Pft f/u       HPI Erik Strong 62 y.o. -presents for IPF follow-up.  Last seen earlier this year.  In the interim the only medication change is that he is no longer taking long-acting Adderall but otherwise taking regular Adderall. Interim Health status: No new complaints No new medical problems. No new surgeries. No ER visits. No Urgent care visits. No changes to medications.  He continues to live with his girlfriend and her son.  That is social support.  He continues to smoke a little marijuana here and there.  In terms of his pulmonary fibrosis he is stable.  In terms of his pirfenidone intake not a problem except for some very mild nausea for 5 or 10 minutes after taking pirfenidone.  He feels stable.  He had pulmonary function test and continued stability since he started his Esbriet/pirfenidone.  Last set of liver function normal in June 2024.  He needs another repeat 1 today.  He did discuss about lung transplant as a care option.  Went to the details of transplant.  Explained to him the financial package he needs.  The social support he needs him he also needs to be free of marijuana smoking.  He needs to lose a lot of weight.  At this point in time he is not interested in a referral but just kind of wanted to know his options.  It appears he may not be interested in transplant.  Nevertheless he is too early in the course of his disease.     SYMPTOM SCALE - ILD 11/12/2020  02/08/2021 Now on esbriet 225# 01/25/2022 231#.  Pirfenidone present 05/06/2022 232#  = esbriet 07/05/2022  10/18/2022 229#e esbriet 08/24/2023 esbriet  O2 use ra ts ra ra ra ra ra  Shortness of Breath 0 -> 5 scale with 5 being worst (score 6 If unable to do)        At rest 1 0/5 1 0 0 0 0  Simple tasks - showers, clothes change, eating, shaving 2 1 2  0.5 1 1 1   Household (dishes, doing bed, laundry) 2  2 3  0/5 2 1 3   Shopping 3 3 2 1 2 2 2   Walking level at own pace 1 2 2  1.5 1 2 2   Walking up Stairs 3 3 3  2.5 3 3 3   Total (30-36) Dyspnea Score 12 11.5 13 6 9 9 11   How bad is your cough?  2 1.5 2 1 1 1 1   How bad is your fatigue 2 3 3 2 2 3 3   How bad is nausea 0 0.5 2 1 1 2  0  How bad is vomiting?  0 0 0 0 0 0 1  How bad is diarrhea? 0 0 0 0 0 0 2  How bad is anxiety? 2 1 2 2 1 2 2   How bad is depression 1 0 0 0 1 1 0  6    Simple office walk 185 feet x  3 laps goal with forehead probe 11/12/2020  /yf  01/25/2022   O2 used ra ra ra  Number laps completed 3 3 3   Comments about pace Slow pace Mod pae   Resting Pulse Ox/HR 96% and 71/min 100% and 73 100% ad HR 59  Final Pulse Ox/HR 95% and 85/min 97% and 93/,o 99% and HR 76  Desaturated </= 88% no no   Desaturated <= 3% points no Yes, 3   Got Tachycardic >/= 90/min no Myes   Symptoms at end of test Some dsypnea last lap Mild-mod dyspnea   Miscellaneous comments x x stable    PFT     Latest Ref Rng & Units 08/24/2023    2:25 PM 10/18/2022    9:06 AM 05/06/2022    1:59 PM 10/21/2019    1:53 PM  ILD indicators  FVC-Pre L 4.36  P 4.41  4.41  4.59   FVC-Predicted Pre % 82  P 83  81  84   FVC-Post L    4.72   FVC-Predicted Post %    86   TLC L    7.98   TLC Predicted %    103   DLCO uncorrected ml/min/mmHg 22.58  P 21.52  20.68  27.70   DLCO UNC %Pred % 75  P 71  66  88   DLCO Corrected ml/min/mmHg 22.58  P 21.52  20.68  27.11   DLCO COR %Pred % 75  P 71  66  86     P Preliminary result      LAB RESULTS last 96 hours No results found.  LAB RESULTS last 90 days Recent Results (from the past 2160 hour(s))  Pulmonary function test     Status: None (Preliminary result)   Collection Time: 08/24/23  2:25 PM  Result Value Ref Range   FVC-Pre 4.36 L   FVC-%Pred-Pre 82 %   FEV1-Pre 3.51 L   FEV1-%Pred-Pre 88 %   FEV6-Pre 4.33 L   FEV6-%Pred-Pre 86 %   Pre FEV1/FVC ratio 80 %   FEV1FVC-%Pred-Pre 106 %   Pre FEV6/FVC  Ratio 100 %   FEV6FVC-%Pred-Pre 104 %   FEF 25-75 Pre 3.61 L/sec   FEF2575-%Pred-Pre 112 %   DLCO unc 22.58 ml/min/mmHg   DLCO unc % pred 75 %   DLCO cor 22.58 ml/min/mmHg   DLCO cor % pred 75 %   DL/VA 8.65 ml/min/mmHg/L   DL/VA % pred 83 %         has a past medical history of ADD (attention deficit disorder), Anxiety, Arthritis, Chronic pain, History of kidney stones, and Temporal head injury.   reports that he quit smoking about 6 years ago. His smoking use included cigarettes. He started smoking about 45 years ago. He has a 84 pack-year smoking history. He has never used smokeless tobacco.  Past Surgical History:  Procedure Laterality Date   HIP SURGERY     I & D  EXTREMITY Left 09/02/2019   Procedure: IRRIGATION AND DEBRIDEMENT HAND;  Surgeon: Mack Hook, MD;  Location: [facility] OR;  Service: Orthopedics;  Laterality: Left;   TOOTH EXTRACTION Bilateral 12/30/2016   Procedure: EXTRACTION MOLARS;  Surgeon: Ocie Doyne, DDS;  Location: [facility] OR;  Service: Oral Surgery;  Laterality: Bilateral;  Extraction of teeth four, five, twenty-six, thirty; removal of bilateral mandibular lingual tori    Allergies  Allergen Reactions   Other Other (See Comments)   Codeine Nausea And Vomiting   Macrodantin [Nitrofurantoin Macrocrystal] Rash   Shellfish Allergy Nausea And Vomiting    Immunization History  Administered Date(s) Administered   Influenza Split 05/10/2018, 05/04/2019, 09/08/2020   Influenza,inj,Quad PF,6+ Mos 09/09/2016, 06/15/2017, 05/10/2018, 05/04/2019, 07/05/2022   PFIZER(Purple Top)SARS-COV-2 Vaccination 12/16/2019, 01/08/2020, 09/08/2020   PNEUMOCOCCAL CONJUGATE-20 11/22/2021   Respiratory Syncytial Virus Vaccine,Recomb Aduvanted(Arexvy) 07/05/2022   Td 07/10/2006   Tdap 05/14/2012, 12/03/2014   Zoster Recombinant(Shingrix) 05/08/2019   Zoster, Live 05/08/2019, 05/20/2020    Family History  Problem Relation Age of Onset   Prostate cancer Father    CAD Maternal  Uncle      Current Outpatient Medications:    acetaminophen (TYLENOL) 325 MG tablet, Take 2 tablets (650 mg total) by mouth every 6 (six) hours., Disp: , Rfl:    albuterol (VENTOLIN HFA) 108 (90 Base) MCG/ACT inhaler, TAKE 2 PUFFS BY MOUTH EVERY 6 HOURS AS NEEDED FOR WHEEZE OR SHORTNESS OF BREATH, Disp: 6.7 each, Rfl: 3   ALPRAZolam (XANAX) 0.5 MG tablet, Take 0.5 mg by mouth at bedtime., Disp: , Rfl:    amphetamine-dextroamphetamine (ADDERALL) 15 MG tablet, take 2 tablets by mouth in the morning., Disp: , Rfl:    aspirin EC 81 MG tablet, Take 1 tablet (81 mg total) by mouth daily. Swallow whole., Disp: 90 tablet, Rfl: 3   atorvastatin (LIPITOR) 20 MG tablet, Take 1 tablet (20 mg total) by mouth daily., Disp: 90 tablet, Rfl: 0   ezetimibe (ZETIA) 10 MG tablet, Take 1 tablet (10 mg total) by mouth daily., Disp: 90 tablet, Rfl: 0   levothyroxine (SYNTHROID) 50 MCG tablet, Take 50 mcg by mouth daily before breakfast., Disp: , Rfl:    phenylephrine-shark liver oil-mineral oil-petrolatum (PREPARATION H) 0.25-3-14-71.9 % rectal ointment, Place 1 application rectally daily as needed for hemorrhoids., Disp: , Rfl:    Pirfenidone (ESBRIET) 801 MG TABS, Take 1 tablet (801 mg) by mouth in the morning, at noon, and at bedtime., Disp: 270 tablet, Rfl: 1   sildenafil (REVATIO) 20 MG tablet, Take 20 mg by mouth daily as needed (FOR ED). , Disp: , Rfl:    SYMBICORT 160-4.5 MCG/ACT inhaler, INHALE 2 PUFFS INTO THE LUNGS TWICE A DAY, Disp: 10.2 each, Rfl: 6   tiZANidine (ZANAFLEX) 2 MG tablet, Take 2 mg by mouth at bedtime., Disp: , Rfl:    traMADol (ULTRAM) 50 MG tablet, Take 50 mg by mouth See admin instructions. Take one tablet (50 mg) by mouth daily at bedtime, may also take one tablet (50 mg) twice daily as needed for pain, Disp: , Rfl:    amphetamine-dextroamphetamine (ADDERALL XR) 30 MG 24 hr capsule, Take 30 mg by mouth daily. (Patient not taking: Reported on 03/14/2023), Disp: , Rfl:    omeprazole  (PRILOSEC) 40 MG capsule, Take 1 capsule (40 mg total) by mouth daily. (Patient not taking: Reported on 03/14/2023), Disp: 90 capsule, Rfl: 3      Objective:   Vitals:   08/24/23 1557  BP: 120/76  Pulse: 87  SpO2: 95%  Weight: 233 lb (105.7 kg)  Height: 6\' 1"  (1.854 m)    Estimated body mass index is 30.74 kg/m as calculated from the following:   Height as of this encounter: 6\' 1"  (1.854 m).   Weight as of this encounter: 233 lb (105.7 kg).  @WEIGHTCHANGE @  Filed Weights   08/24/23 1557  Weight: 233 lb (105.7 kg)     Physical Exam   General: No distress. Looks well O2 at rest: no Cane present: no Sitting in wheel chair: no Frail: no Obese: no Neuro: Alert and Oriented x 3. GCS 15. Speech normal Psych: Pleasant Resp:  Barrel Chest - no.  Wheeze - no, Crackles - YES, No overt respiratory distress CVS: Normal heart sounds. Murmurs - no Ext: Stigmata of Connective Tissue Disease - no HEENT: Normal upper airway. PEERL +. No post nasal drip        Assessment:     No diagnosis found.     Plan:     Patient Instructions   #Pulmonary fibrosis - IPF  -clinically stable as of 08/24/2023  since Aug 2023 but worse sinc 2021. 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    SIGNATURE    Dr. Kalman Shan, M.D., F.C.C.P,  Pulmonary and Critical Care Medicine Staff Physician, [facility] Director - Interstitial Lung  Disease  Program  Pulmonary Fibrosis [facility] at [facility] [place], [place], [address]  Pager: [phone], If no answer or between  15:00h - 7:00h: call [phone] Telephone: [phone]  4:41 PM 08/24/2023

## 2023-08-25 ENCOUNTER — Other Ambulatory Visit: Payer: Self-pay | Admitting: Pharmacist

## 2023-08-25 DIAGNOSIS — J84112 Idiopathic pulmonary fibrosis: Secondary | ICD-10-CM

## 2023-08-25 LAB — HEPATIC FUNCTION PANEL
ALT: 35 U/L (ref 0–53)
AST: 30 U/L (ref 0–37)
Albumin: 4.2 g/dL (ref 3.5–5.2)
Alkaline Phosphatase: 59 U/L (ref 39–117)
Bilirubin, Direct: 0.1 mg/dL (ref 0.0–0.3)
Total Bilirubin: 0.4 mg/dL (ref 0.2–1.2)
Total Protein: 7.3 g/dL (ref 6.0–8.3)

## 2023-08-25 MED ORDER — PIRFENIDONE 801 MG PO TABS: 801.0000 mg | ORAL_TABLET | Freq: Three times a day (TID) | ORAL | 1 refills | Status: DC

## 2023-08-25 NOTE — Telephone Encounter (Signed)
Refill for Esbriet sent to [facility]  Dose: 801mg  three times daily  Chesley Mires, PharmD, MPH, BCPS, CPP Clinical Pharmacist (Rheumatology and Pulmonology)
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
## 2023-09-14 ENCOUNTER — Ambulatory Visit (INDEPENDENT_AMBULATORY_CARE_PROVIDER_SITE_OTHER): Payer: Medicare Other

## 2023-09-14 ENCOUNTER — Ambulatory Visit: Payer: Medicare Other | Attending: Cardiology | Admitting: Cardiology

## 2023-09-14 ENCOUNTER — Encounter: Payer: Self-pay | Admitting: Cardiology

## 2023-09-14 VITALS — BP 130/64 | HR 62 | Ht 73.0 in | Wt 236.0 lb

## 2023-09-14 DIAGNOSIS — R002 Palpitations: Secondary | ICD-10-CM | POA: Diagnosis not present

## 2023-09-14 DIAGNOSIS — I2510 Atherosclerotic heart disease of native coronary artery without angina pectoris: Secondary | ICD-10-CM

## 2023-09-14 DIAGNOSIS — E785 Hyperlipidemia, unspecified: Secondary | ICD-10-CM | POA: Diagnosis not present

## 2023-09-14 DIAGNOSIS — G4733 Obstructive sleep apnea (adult) (pediatric): Secondary | ICD-10-CM | POA: Diagnosis not present

## 2023-09-14 NOTE — Patient Instructions (Addendum)
Medication Instructions:  Your physician has recommended you make the following change in your medication:  STOP: Aspirin  *If you need a refill on your cardiac medications before your next appointment, please call your pharmacy*  Testing/Procedures: ZIO XT- Long Term Monitor Instructions  Your physician has requested you wear a ZIO patch monitor for 14 days.  This is a single patch monitor. [employer] supplies one patch monitor per enrollment. Additional stickers are not available. Please do not apply patch if you will be having a Nuclear Stress Test,  Echocardiogram, Cardiac CT, MRI, or Chest Xray during the period you would be wearing the  monitor. The patch cannot be worn during these tests. You cannot remove and re-apply the  ZIO XT patch monitor.  Your ZIO patch monitor will be mailed 3 day USPS to your address on file. It may take 3-5 days  to receive your monitor after you have been enrolled.  Once you have received your monitor, please review the enclosed instructions. Your monitor  has already been registered assigning a specific monitor serial # to you.  Billing and Patient Assistance Program Information  We have supplied [employer] with any of your insurance information on file for billing purposes. [employer] offers a sliding scale Patient Assistance Program for patients that do not have  insurance, or whose insurance does not completely cover the cost of the ZIO monitor.  You must apply for the Patient Assistance Program to qualify for this discounted rate.  To apply, please call [employer] at [phone], select option 4, select option 2, ask to apply for  Patient Assistance Program. Meredeth Ide will ask your household income, and how many people  are in your household. They will quote your out-of-pocket cost based on that information.  [employer] will also be able to set up a 4-month, interest-free payment plan if needed.  Applying the monitor   Shave hair from upper left chest.  Hold  abrader disc by orange tab. Rub abrader in 40 strokes over the upper left chest as  indicated in your monitor instructions.  Clean area with 4 enclosed alcohol pads. Let dry.  Apply patch as indicated in monitor instructions. Patch will be placed under collarbone on left  side of chest with arrow pointing upward.  Rub patch adhesive wings for 2 minutes. Remove white label marked "1". Remove the white  label marked "2". Rub patch adhesive wings for 2 additional minutes.  While looking in a mirror, press and release button in center of patch. A small green light will  flash 3-4 times. This will be your only indicator that the monitor has been turned on.  Do not shower for the first 24 hours. You may shower after the first 24 hours.  Press the button if you feel a symptom. You will hear a small click. Record Date, Time and  Symptom in the Patient Logbook.  When you are ready to remove the patch, follow instructions on the last 2 pages of Patient  Logbook. Stick patch monitor onto the last page of Patient Logbook.  Place Patient Logbook in the blue and white box. Use locking tab on box and tape box closed  securely. The blue and white box has prepaid postage on it. Please place it in the mailbox as  soon as possible. Your physician should have your test results approximately 7 days after the  monitor has been mailed back to [facility].  Call [facility] Customer Care at [phone] if you have questions regarding  your  ZIO XT patch monitor. Call them immediately if you see an orange light blinking on your  monitor.  If your monitor falls off in less than 4 days, contact our Monitor department at [phone].  If your monitor becomes loose or falls off after 4 days call [employer] at [phone] for  suggestions on securing your monitor  WatchPAT? is an FDA-cleared portable home sleep study test that uses a watch and 3 points of contact to monitor 7 different channels, including your  heart rate, oxygen saturation, body position, snoring, and chest motion.  The study is easy to use from the comfort of your own home and accurately detect sleep apnea.  Before bed, you attach the chest sensor, attached the sleep apnea bracelet to your nondominant hand, and attach the finger probe.  After the study, the raw data is downloaded from the watch and scored for apnea events.   For more information: [email or web address]  Patient Testing Instructions:  Once our office has received insurance approval for you to complete this test, we will contact you with a PIN to activate the device.  This typically takes 2-3 weeks.  Please do not open the box until approved.   Do not put battery into the device until bedtime when you are ready to begin the test. Please call the support number if you need assistance after following the instructions below: 24 hour support line- [phone] or [facility] support at [phone] (option 2)  Download the [facility] [facility] One" app through the [employer] or [employer]. Be sure to turn on or enable access to Bluetooth in settings on your smartphone. Make sure no other Bluetooth devices are on and within the vicinity of your smartphone and WatchPAT watch during testing.  Make sure to leave your smart phone plugged in and charging all night.  When ready for bed:  Follow the instructions step by step in the WatchPAT One app to activate the testing device. For additional instructions, including video instruction, visit the WatchPAT One video on Youtube. You can search for WatchPAT One within Youtube (video is 4 minutes and 18 seconds) or enter: https://youtube/[email or web address]?[email or web address]_vbiwxE Please note: You will be prompted to enter a PIN to connect via Bluetooth when starting the test. The PIN will be assigned to you after insurance has approved the test.  The device is disposable, but it recommended that you retain the device until you receive a call  letting you know the study has been received and the results have been interpreted.  We will let you know if the study did not transmit to [place] properly after the test is completed. You do not need to call us to confirm the receipt of the test.  Please complete the test within 48 hours of receiving PIN.   Frequently Asked Questions:  What is Watch PAT One?  A single use, fully disposable home sleep apnea testing device and will not need to be returned after completion.  What are the requirements to use WatchPAT One?  A successful WatchPAT One sleep study requires a WatchPAT One device, your smart phone, WatchPAT One app, your PIN number, and internet access. What type of phone do I need?  You should have a smart phone that uses Android 5.1 and above or any iPhone with IOS 10 and above. How can I download the WatchPAT one app?  Based on your device type search for WatchPAT One app either in [employer] for [employer] or [employer] for [employer].  Where will I get my PIN for the study?  Your PIN will be provided by your physician's office after insurance has approved the test. This process typically takes 2-3 weeks. It is used for authentication and if you lose/forget your PIN, please reach out to your provider's office.  I do not have internet at home. Can I still complete a WatchPAT One study?  WatchPAT One needs internet connection throughout the night to be able to transmit the sleep data. You can use your home/local internet or your cellular data package. However, it is always recommended to use home/local internet. It is estimated that between 20MB-30MB of data will be used with each study, but the application will be looking for space in the phone to start the study.  What happens if I lose internet or Bluetooth connection?  During the internet disconnection, your phone will not be able to transmit the sleep data.  All the data, will be stored in your phone.  As soon as the  internet connection is back on, the phone will resume sending the sleep data. During the Bluetooth disconnection, WatchPAT One will not be able to to send the sleep data to your phone.  Data will be kept in the WatchPAT One until both devices have Bluetooth connection back on.  As soon as the connection is back on, WatchPAT one will send the sleep data to the phone.  How long do I need to wear the WatchPAT one?  After you start the study, you should wear the device at least 6 hours.  How far should I keep my phone from the device?  During the night, your phone should remain within 15 feet of where you sleep.  What happens if I leave the room for restroom or other reasons?  Leaving the room for any reason will not cause any problem. As soon as your get back to the room, both devices will reconnect and will continue to send the sleep data. Can I use my phone during the sleep study?  Yes, you can use your phone as usual during the study. But it is recommended to put your WatchPAT One on when you are ready to go to bed.  How will I get my study results?  A soon as you completed your study, your sleep data will be sent to the provider. They will then share the results with you when they are ready.     Follow-Up: At [facility] you and your health needs are our priority.  As part of our continuing mission to provide you with exceptional heart care, we have created designated Provider Care Teams.  These Care Teams include your primary Cardiologist (physician) and Advanced Practice Providers (APPs -  Physician Assistants and Nurse Practitioners) who all work together to provide you with the care you need, when you need it.   Your next appointment:   6 month(s)  Provider:   Epifanio Lesches, MD

## 2023-09-14 NOTE — Progress Notes (Unsigned)
Enrolled for [employer] to mail a ZIO XT long term holter monitor to the patients address on file.  

## 2023-09-15 DIAGNOSIS — H5203 Hypermetropia, bilateral: Secondary | ICD-10-CM | POA: Diagnosis not present

## 2023-09-15 DIAGNOSIS — H52223 Regular astigmatism, bilateral: Secondary | ICD-10-CM | POA: Diagnosis not present

## 2023-09-15 DIAGNOSIS — H2513 Age-related nuclear cataract, bilateral: Secondary | ICD-10-CM | POA: Diagnosis not present

## 2023-09-15 DIAGNOSIS — H524 Presbyopia: Secondary | ICD-10-CM | POA: Diagnosis not present

## 2023-09-15 DIAGNOSIS — H33101 Unspecified retinoschisis, right eye: Secondary | ICD-10-CM | POA: Diagnosis not present

## 2023-09-17 DIAGNOSIS — R002 Palpitations: Secondary | ICD-10-CM

## 2023-10-09 ENCOUNTER — Telehealth: Payer: Self-pay | Admitting: *Deleted

## 2023-10-09 DIAGNOSIS — R002 Palpitations: Secondary | ICD-10-CM

## 2023-10-09 NOTE — Telephone Encounter (Signed)
-----   Message from Little Ishikawa sent at 10/08/2023  7:52 PM EST ----- Few episodes of fast heart rhythms from top and bottom chamber but short duration.  Recommend checking echocardiogram

## 2023-10-09 NOTE — Telephone Encounter (Signed)
Spoke with pt, aware of the results and order placed for echo.

## 2023-10-20 ENCOUNTER — Telehealth: Payer: Self-pay

## 2023-10-20 NOTE — Telephone Encounter (Signed)
Ordering provider: Bjorn Pippin Associated diagnoses: E78.5, G47.33, J44.9 WatchPAT PA obtained on 10/20/2023 by Brunetta Genera, LPN. Authorization: Yes; tracking ID [id number]. Patient notified of PIN (1234) on 10/20/2023 via Notification Method: phone.

## 2023-10-21 ENCOUNTER — Encounter (INDEPENDENT_AMBULATORY_CARE_PROVIDER_SITE_OTHER): Payer: Medicare Other | Admitting: Cardiology

## 2023-10-21 DIAGNOSIS — G4733 Obstructive sleep apnea (adult) (pediatric): Secondary | ICD-10-CM | POA: Diagnosis not present

## 2023-10-26 ENCOUNTER — Ambulatory Visit (HOSPITAL_COMMUNITY): Payer: Medicare Other | Attending: Internal Medicine

## 2023-10-26 ENCOUNTER — Ambulatory Visit: Payer: Medicare Other | Attending: Cardiology

## 2023-10-26 DIAGNOSIS — Q2543 Congenital aneurysm of aorta: Secondary | ICD-10-CM | POA: Diagnosis not present

## 2023-10-26 DIAGNOSIS — I471 Supraventricular tachycardia, unspecified: Secondary | ICD-10-CM | POA: Insufficient documentation

## 2023-10-26 DIAGNOSIS — G4730 Sleep apnea, unspecified: Secondary | ICD-10-CM | POA: Insufficient documentation

## 2023-10-26 DIAGNOSIS — R002 Palpitations: Secondary | ICD-10-CM

## 2023-10-26 DIAGNOSIS — I2510 Atherosclerotic heart disease of native coronary artery without angina pectoris: Secondary | ICD-10-CM

## 2023-10-26 DIAGNOSIS — E785 Hyperlipidemia, unspecified: Secondary | ICD-10-CM | POA: Diagnosis not present

## 2023-10-26 DIAGNOSIS — G4733 Obstructive sleep apnea (adult) (pediatric): Secondary | ICD-10-CM

## 2023-10-26 DIAGNOSIS — J449 Chronic obstructive pulmonary disease, unspecified: Secondary | ICD-10-CM | POA: Insufficient documentation

## 2023-10-26 DIAGNOSIS — G8929 Other chronic pain: Secondary | ICD-10-CM | POA: Diagnosis not present

## 2023-10-26 LAB — ECHOCARDIOGRAM COMPLETE
Area-P 1/2: 3.24 cm2
S' Lateral: 2.5 cm

## 2023-10-30 ENCOUNTER — Telehealth: Payer: Self-pay | Admitting: Cardiology

## 2023-10-30 ENCOUNTER — Other Ambulatory Visit: Payer: Self-pay | Admitting: *Deleted

## 2023-10-30 DIAGNOSIS — I77819 Aortic ectasia, unspecified site: Secondary | ICD-10-CM

## 2023-10-30 NOTE — Telephone Encounter (Signed)
 Called and made patient aware of Dr. Alda Amas results and recommendations after ECHO. Per Dr. Alda Amas dilated aorta, recommend repeat echocardiogram in 1 year to monitor. Otherwise no significant abnormalities. Patient verbalized an understanding

## 2023-10-30 NOTE — Telephone Encounter (Signed)
 Patient was calling in about his echo results. Please advise

## 2023-10-30 NOTE — Telephone Encounter (Signed)
 Results have been sent to MyChart by Cheryl Corporal RN and patient reviewed 10/30/23 @ 10:08am

## 2023-10-31 ENCOUNTER — Telehealth: Payer: Self-pay

## 2023-10-31 NOTE — Telephone Encounter (Signed)
-----   Message from Armanda Magic sent at 10/26/2023  7:46 PM EST ----- Please let patient know that they have sleep apnea.  Recommend therapeutic CPAP titration for treatment of patient's sleep disordered breathing.

## 2023-10-31 NOTE — Telephone Encounter (Signed)
Notified patient of sleep study results and recommendations. Patient is interested in [place] device. Message sent to [facility] to schedule appointment for patient education.

## 2023-11-20 ENCOUNTER — Other Ambulatory Visit: Payer: Self-pay | Admitting: Cardiology
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
## 2023-11-30 ENCOUNTER — Telehealth: Payer: Self-pay

## 2023-11-30 ENCOUNTER — Ambulatory Visit: Admitting: Cardiology

## 2023-11-30 DIAGNOSIS — J849 Interstitial pulmonary disease, unspecified: Secondary | ICD-10-CM

## 2023-11-30 DIAGNOSIS — R7989 Other specified abnormal findings of blood chemistry: Secondary | ICD-10-CM

## 2023-11-30 DIAGNOSIS — I2510 Atherosclerotic heart disease of native coronary artery without angina pectoris: Secondary | ICD-10-CM

## 2023-11-30 DIAGNOSIS — E785 Hyperlipidemia, unspecified: Secondary | ICD-10-CM

## 2023-11-30 DIAGNOSIS — R002 Palpitations: Secondary | ICD-10-CM

## 2023-11-30 DIAGNOSIS — G4733 Obstructive sleep apnea (adult) (pediatric): Secondary | ICD-10-CM

## 2023-11-30 DIAGNOSIS — I77819 Aortic ectasia, unspecified site: Secondary | ICD-10-CM

## 2023-11-30 NOTE — Telephone Encounter (Signed)
 Notified patient of Provider recommendations. Per Dr. Mayford Knife: He is not a candidate at this time and the insurance will not pay for the device unless he has tried CPAP for 6 months. Recommend going ahead and starting on ResMed auto CPAP from 4 to 15 cm H2O and then schedule OV after 6 weeks of Initial Therapy to see how he is doing.

## 2023-12-08 ENCOUNTER — Other Ambulatory Visit: Payer: Self-pay

## 2023-12-08 ENCOUNTER — Emergency Department (HOSPITAL_BASED_OUTPATIENT_CLINIC_OR_DEPARTMENT_OTHER)

## 2023-12-08 ENCOUNTER — Encounter (HOSPITAL_BASED_OUTPATIENT_CLINIC_OR_DEPARTMENT_OTHER): Payer: Self-pay | Admitting: Emergency Medicine

## 2023-12-08 ENCOUNTER — Emergency Department (HOSPITAL_BASED_OUTPATIENT_CLINIC_OR_DEPARTMENT_OTHER): Admitting: Radiology

## 2023-12-08 ENCOUNTER — Emergency Department (HOSPITAL_BASED_OUTPATIENT_CLINIC_OR_DEPARTMENT_OTHER)
Admission: EM | Admit: 2023-12-08 | Discharge: 2023-12-08 | Disposition: A | Discharge status: Home / Self Care | Attending: Emergency Medicine | Admitting: Emergency Medicine

## 2023-12-08 DIAGNOSIS — R9389 Abnormal findings on diagnostic imaging of other specified body structures: Secondary | ICD-10-CM

## 2023-12-08 DIAGNOSIS — J3489 Other specified disorders of nose and nasal sinuses: Secondary | ICD-10-CM | POA: Diagnosis not present

## 2023-12-08 DIAGNOSIS — R911 Solitary pulmonary nodule: Secondary | ICD-10-CM | POA: Insufficient documentation

## 2023-12-08 DIAGNOSIS — R002 Palpitations: Secondary | ICD-10-CM | POA: Insufficient documentation

## 2023-12-08 DIAGNOSIS — F419 Anxiety disorder, unspecified: Secondary | ICD-10-CM | POA: Diagnosis not present

## 2023-12-08 DIAGNOSIS — R918 Other nonspecific abnormal finding of lung field: Secondary | ICD-10-CM | POA: Diagnosis not present

## 2023-12-08 DIAGNOSIS — R0789 Other chest pain: Secondary | ICD-10-CM | POA: Diagnosis present

## 2023-12-08 DIAGNOSIS — R0602 Shortness of breath: Secondary | ICD-10-CM | POA: Diagnosis not present

## 2023-12-08 LAB — BASIC METABOLIC PANEL
Anion gap: 7 (ref 5–15)
BUN: 16 mg/dL (ref 8–23)
CO2: 26 mmol/L (ref 22–32)
Calcium: 9.3 mg/dL (ref 8.9–10.3)
Chloride: 106 mmol/L (ref 98–111)
Creatinine, Ser: 1.32 mg/dL — ABNORMAL HIGH (ref 0.61–1.24)
GFR, Estimated: >60 mL/min (ref >60)
Glucose, Bld: 110 mg/dL — ABNORMAL HIGH (ref 70–99)
Potassium: 4.2 mmol/L (ref 3.5–5.1)
Sodium: 139 mmol/L (ref 135–145)

## 2023-12-08 LAB — HEPATIC FUNCTION PANEL
ALT: 35 U/L (ref 0–44)
AST: 23 U/L (ref 15–41)
Albumin: 4.1 g/dL (ref 3.5–5.0)
Alkaline Phosphatase: 70 U/L (ref 38–126)
Bilirubin, Direct: 0.1 mg/dL (ref 0.0–0.2)
Indirect Bilirubin: 0.2 mg/dL — ABNORMAL LOW (ref 0.3–0.9)
Total Bilirubin: 0.3 mg/dL (ref 0.0–1.2)
Total Protein: 6.6 g/dL (ref 6.5–8.1)

## 2023-12-08 LAB — TSH: TSH: 3.16 u[IU]/mL (ref 0.350–4.500)

## 2023-12-08 LAB — CBC
HCT: 45.3 % (ref 39.0–52.0)
Hemoglobin: 14.9 g/dL (ref 13.0–17.0)
MCH: 30.9 pg (ref 26.0–34.0)
MCHC: 32.9 g/dL (ref 30.0–36.0)
MCV: 94 fL (ref 80.0–100.0)
Platelets: 306 10*3/uL (ref 150–400)
RBC: 4.82 MIL/uL (ref 4.22–5.81)
RDW: 13 % (ref 11.5–15.5)
WBC: 8.6 10*3/uL (ref 4.0–10.5)
nRBC: 0 % (ref 0.0–0.2)

## 2023-12-08 LAB — TROPONIN I (HIGH SENSITIVITY)
Troponin I (High Sensitivity): 5 ng/L (ref <18)
Troponin I (High Sensitivity): 4 ng/L (ref <18)

## 2023-12-08 LAB — MAGNESIUM: Magnesium: 2.2 mg/dL (ref 1.7–2.4)

## 2023-12-08 LAB — D-DIMER, QUANTITATIVE: D-Dimer, Quant: 0.62 ug{FEU}/mL — ABNORMAL HIGH (ref 0.00–0.50)

## 2023-12-08 LAB — LIPASE, BLOOD: Lipase: 31 U/L (ref 11–51)

## 2023-12-08 MED ORDER — SODIUM CHLORIDE 0.9 % IV BOLUS
500.0000 mL | Freq: Once | INTRAVENOUS | Status: AC
Start: 2023-12-08 — End: 2023-12-08
  Administered 2023-12-08: 500 mL via INTRAVENOUS

## 2023-12-08 MED ORDER — IOHEXOL 350 MG/ML SOLN
75.0000 mL | Freq: Once | INTRAVENOUS | Status: AC | PRN
  Administered 2023-12-08: 75 mL via INTRAVENOUS

## 2023-12-08 NOTE — Discharge Instructions (Signed)
 Your history, exam, workup today are consistent with suspected palpitation chest discomfort related to the stress and anxiety as we discussed.  Your CT scan did not show blood clot but did show the small nodule with your pulmonary fibrosis.  Please follow-up with your primary doctor and your lung doctor.  Please rest and stay hydrated and try to avoid stressful situations.  If any symptoms change or worsen acutely, please return to the nearest emergency department.

## 2023-12-08 NOTE — ED Triage Notes (Signed)
 Pt via pov from home with cp since last night; states he took a xanax to sleep last night and had some "slight twinges" that kept him up all night. Endorses sob as well, but has pulmonary fibrosis - not usually this bad. Pt denies n/v. A&O x 4; nad noted.

## 2023-12-08 NOTE — ED Notes (Signed)
 Called lab to add mag, hepatic, and lipase. Spoke with Lajoyce Corners.

## 2023-12-08 NOTE — ED Provider Notes (Signed)
 [facility] [facility] AT [facility] Provider Note   CSN: [id number] Arrival date & time: 12/08/23  0825     History  Chief Complaint  Patient presents with   Chest Pain    EYTAN CARRIGAN is a 63 y.o. male.  The history is provided by the patient and medical records. No language interpreter was used.  Chest Pain Pain location:  L chest Pain quality: sharp and tightness   Pain radiates to:  Does not radiate Pain severity:  Moderate Onset quality:  Gradual Duration:  1 day Timing:  Constant Progression:  Waxing and waning Context: breathing and at rest   Relieved by:  Nothing Worsened by:  Deep breathing Ineffective treatments:  None tried Associated symptoms: anxiety, palpitations and shortness of breath   Associated symptoms: no abdominal pain, no altered mental status, no back pain, no cough, no diaphoresis, no fatigue, no fever, no headache, no lower extremity edema, no nausea, no near-syncope, no numbness, no vomiting and no weakness        Home Medications Prior to Admission medications   Medication Sig Start Date End Date Taking? Authorizing Provider  acetaminophen (TYLENOL) 325 MG tablet Take 2 tablets (650 mg total) by mouth every 6 (six) hours. 09/03/19   Mack Hook, MD  albuterol (VENTOLIN HFA) 108 (90 Base) MCG/ACT inhaler TAKE 2 PUFFS BY MOUTH EVERY 6 HOURS AS NEEDED FOR WHEEZE OR SHORTNESS OF BREATH 03/01/23   Kalman Shan, MD  ALPRAZolam Prudy Feeler) 0.5 MG tablet Take 0.5 mg by mouth at bedtime.    [provider]  amphetamine-dextroamphetamine (ADDERALL) 15 MG tablet Take 30 mg by mouth daily. 12/22/21   [provider]  atorvastatin (LIPITOR) 20 MG tablet Take 1 tablet (20 mg total) by mouth daily. 07/27/23   Little Ishikawa, MD  ezetimibe (ZETIA) 10 MG tablet TAKE 1 TABLET BY MOUTH EVERY DAY 11/22/23   Little Ishikawa, MD  levothyroxine (SYNTHROID) 50 MCG tablet Take 50 mcg by mouth daily before  breakfast. 08/12/19   [provider]  phenylephrine-shark liver oil-mineral oil-petrolatum (PREPARATION H) 0.25-3-14-71.9 % rectal ointment Place 1 application rectally daily as needed for hemorrhoids.    [provider]  Pirfenidone (ESBRIET) 801 MG TABS Take 1 tablet (801 mg) by mouth in the morning, at noon, and at bedtime. 08/25/23   Kalman Shan, MD  sildenafil (REVATIO) 20 MG tablet Take 20 mg by mouth daily as needed (FOR ED).     [provider]  SYMBICORT 160-4.5 MCG/ACT inhaler INHALE 2 PUFFS INTO THE LUNGS TWICE A DAY 05/25/23   Nyoka Cowden, MD  tiZANidine (ZANAFLEX) 2 MG tablet Take 2 mg by mouth at bedtime.    [provider]  traMADol (ULTRAM) 50 MG tablet Take 50 mg by mouth See admin instructions. Take one tablet (50 mg) by mouth daily at bedtime, may also take one tablet (50 mg) twice daily as needed for pain 04/03/17   [provider]      Allergies    Other, Codeine, Macrodantin [nitrofurantoin macrocrystal], and Shellfish allergy    Review of Systems   Review of Systems  Constitutional:  Negative for chills, diaphoresis, fatigue and fever.  HENT:  Negative for congestion.   Respiratory:  Positive for chest tightness and shortness of breath. Negative for cough and wheezing.   Cardiovascular:  Positive for chest pain and palpitations. Negative for leg swelling and near-syncope.  Gastrointestinal:  Negative for abdominal pain, constipation, diarrhea, nausea and vomiting.  Genitourinary:  Negative for dysuria.  Musculoskeletal:  Negative for back pain, neck pain and neck stiffness.  Skin:  Negative for rash and wound.  Neurological:  Negative for weakness, numbness and headaches.  Psychiatric/Behavioral:  Negative for agitation.   All other systems reviewed and are negative.   Physical Exam Updated Vital Signs BP 131/74 (BP Location: Right Arm)   Pulse 64   Temp 97.7 F (36.5 C) (Oral)   Resp 19   Ht 6\' 1"  (1.854 m)    Wt 106.6 kg   SpO2 99%   BMI 31.00 kg/m  Physical Exam Vitals and nursing note reviewed.  Constitutional:      General: He is not in acute distress.    Appearance: He is well-developed. He is not ill-appearing, toxic-appearing or diaphoretic.  HENT:     Head: Normocephalic and atraumatic.     Mouth/Throat:     Mouth: Mucous membranes are dry.  Eyes:     Conjunctiva/sclera: Conjunctivae normal.     Pupils: Pupils are equal, round, and reactive to light.  Cardiovascular:     Rate and Rhythm: Normal rate and regular rhythm.     Heart sounds: Normal heart sounds. No murmur heard. Pulmonary:     Effort: Pulmonary effort is normal. No respiratory distress.     Breath sounds: Rhonchi present. No wheezing or rales.  Chest:     Chest wall: No tenderness.  Abdominal:     Palpations: Abdomen is soft.     Tenderness: There is no abdominal tenderness. There is no right CVA tenderness, left CVA tenderness, guarding or rebound.  Musculoskeletal:        General: No swelling or tenderness.     Cervical back: Neck supple. No tenderness.     Right lower leg: No tenderness. No edema.     Left lower leg: No tenderness. No edema.  Skin:    General: Skin is warm and dry.     Capillary Refill: Capillary refill takes less than 2 seconds.     Findings: No erythema or rash.  Neurological:     General: No focal deficit present.     Mental Status: He is alert.  Psychiatric:        Mood and Affect: Mood normal.     ED Results / Procedures / Treatments   Labs (all labs ordered are listed, but only abnormal results are displayed) Labs Reviewed  BASIC METABOLIC PANEL - Abnormal; Notable for the following components:      Result Value   Glucose, Bld 110 (*)    Creatinine, Ser 1.32 (*)    All other components within normal limits  D-DIMER, QUANTITATIVE (NOT AT [facility]) - Abnormal; Notable for the following components:   D-Dimer, Quant 0.62 (*)    All other components within normal limits  HEPATIC  FUNCTION PANEL - Abnormal; Notable for the following components:   Indirect Bilirubin 0.2 (*)    All other components within normal limits  CBC  TSH  LIPASE, BLOOD  MAGNESIUM  TROPONIN I (HIGH SENSITIVITY)  TROPONIN I (HIGH SENSITIVITY)    EKG EKG Interpretation Date/Time:  Friday December 08 2023 08:31:20 EDT Ventricular Rate:  68 PR Interval:  204 QRS Duration:  112 QT Interval:  414 QTC Calculation: 441 R Axis:   99  Text Interpretation: Sinus rhythm Incomplete right bundle branch block when compared to prior, more wandering baseline No STEMI Confirmed by Theda Belfast ([id number]) on 12/08/2023 8:37:49 AM  Radiology CT Angio Chest PE  W and/or Wo Contrast Result Date: 12/08/2023 CLINICAL DATA:  Chest pain and shortness of breath. Positive D-dimer level. EXAM: CT ANGIOGRAPHY CHEST WITH CONTRAST TECHNIQUE: Multidetector CT imaging of the chest was performed using the standard protocol during bolus administration of intravenous contrast. Multiplanar CT image reconstructions and MIPs were obtained to evaluate the vascular anatomy. RADIATION DOSE REDUCTION: This exam was performed according to the departmental dose-optimization program which includes automated exposure control, adjustment of the mA and/or kV according to patient size and/or use of iterative reconstruction technique. CONTRAST:  75mL OMNIPAQUE IOHEXOL 350 MG/ML SOLN COMPARISON:  February 23, 2022. FINDINGS: Cardiovascular: Satisfactory opacification of the pulmonary arteries to the segmental level. No evidence of pulmonary embolism. Normal heart size. No pericardial effusion. Mediastinum/Nodes: No enlarged mediastinal, hilar, or axillary lymph nodes. Thyroid gland, trachea, and esophagus demonstrate no significant findings. Lungs/Pleura: No pneumothorax or pleural effusion is noted. Emphysematous disease is noted in the upper lobes. Bibasilar peripheral honeycombing is again noted with interstitial thickening in the lung bases consistent  with pulmonary fibrosis. 14 x 11 mm subpleural density is noted in left posterior costophrenic sulcus of uncertain etiology. Upper Abdomen: No acute abnormality. Musculoskeletal: No chest wall abnormality. No acute or significant osseous findings. Review of the MIP images confirms the above findings. IMPRESSION: No definite evidence of pulmonary embolus. 14 x 11 mm irregular subpleural density is noted in left posterior costophrenic sulcus. Neoplasm cannot be excluded. Consider one of the following in 3 months for both low-risk and high-risk individuals: (a) repeat chest CT, (b) follow-up PET-CT, or (c) tissue sampling. This recommendation follows the consensus statement: Guidelines for Management of Incidental Pulmonary Nodules Detected on CT Images: From the Fleischner Society 2017; Radiology 2017; 284:228-243. Findings consistent with pulmonary fibrosis as noted on prior exam. Aortic Atherosclerosis (ICD10-I70.0) and Emphysema (ICD10-J43.9). Electronically Signed   By: Lupita Raider M.D.   On: 12/08/2023 12:04   DG Chest 2 View Result Date: 12/08/2023 CLINICAL DATA:  Chest pain and shortness of breath EXAM: CHEST - 2 VIEW COMPARISON:  Chest radiograph dated 09/07/2020 FINDINGS: Normal lung volumes. Similar reticulations in the lung bases. No focal consolidations. No pleural effusion or pneumothorax. The heart size and mediastinal contours are within normal limits. No acute osseous abnormality. IMPRESSION: Similar reticulations in the lung bases, likely scarring. No focal consolidations. Electronically Signed   By: Agustin Cree M.D.   On: 12/08/2023 09:42    Procedures Procedures    Medications Ordered in ED Medications  sodium chloride 0.9 % bolus 500 mL (0 mLs Intravenous Stopped 12/08/23 1028)  iohexol (OMNIPAQUE) 350 MG/ML injection 75 mL (75 mLs Intravenous Contrast Given 12/08/23 1137)    ED Course/ Medical Decision Making/ A&P                                 Medical Decision Making Amount  and/or Complexity of Data Reviewed Labs: ordered. Radiology: ordered.  Risk Prescription drug management.    SHARRIEFF SPRATLIN is a 63 y.o. male with a past medical history significant for pulmonary fibrosis, anxiety, ADD, kidney stones, and previous head injury who presents with palpitations, shortness of breath, and chest discomfort.  According to patient, he got into an argument last night with someone on the phone and his heart felt like it was racing.  He reports that he was feeling palpitations.  He tried taking a Xanax and lay down but did not sleep at all  overnight.  He reports he is having a sharp pain in his left chest that was slightly worse with deep breathing.  He reports some shortness of breath with it.  He had no sleep overnight and was concerned about it.  He reports no nausea, vomiting, diaphoresis.  Denies any constipation, diarrhea, or urinary changes.  Denies any leg pain or leg swelling and has no history of blood clots.  Reports the pain was not in the back or anywhere else.  He said this when he was still having some discomfort in his left chest and he presents for evaluation.  On exam, lungs were coarse with suspected fibrosis.  Did not appreciate significant rales or wheezing.  Chest was nontender.  No murmur.  Abdomen nontender.  Good pulses in extremities.  Legs are nontender not critically edematous.  Dry mucous membranes and patient feels like he may be dehydrated.  EKG appears similar to prior.  No STEMI.  With his reported tachycardia overnight, palpitations, and pleuritic discomfort with his age, will get a D-dimer to help rule out a thromboembolic etiology.  Clinically I suspect this was anxiety and stress from this argument that got him worked up and had some physical manifestations of this with chest discomfort.  If workup reassuring, dissipate discharge home to follow-up with his primary teams.  Anticipate reassessment after workup.  11:27 AM D-dimer was elevated,  even with age adjustment, will get CT PE study.  If this is reassuring, dissipate discharge home with likely stress and anxiety causing palpitations and chest discomfort.  1:56 PM CT scan showed no evidence of pulmonary embolism.  Troponin was negative x 2.  We went through all the findings together including a nodule that showed up on his CT scan and he will follow-up with his PCP for this and pulmonologist for this.  Patient agreed with plan of care and return precautions.  Patient discharged in good condition after reassuring workup for suspected palpitations and chest discomfort related to stress and anxiety.  Patient discharged in good condition.        Final Clinical Impression(s) / ED Diagnoses Final diagnoses:  Palpitations  Atypical chest pain  Lung nodule  Abnormal CT of the chest    Rx / DC Orders ED Discharge Orders     None      Clinical Impression: 1. Palpitations   2. Atypical chest pain   3. Lung nodule   4. Abnormal CT of the chest     Disposition: Discharge  Condition: Good  I have discussed the results, Dx and Tx plan with the pt(& family if present). He/she/they expressed understanding and agree(s) with the plan. Discharge instructions discussed at great length. Strict return precautions discussed and pt &/or family have verbalized understanding of the instructions. No further questions at time of discharge.    New Prescriptions   No medications on file    Follow Up: Irven Coe, MD [address] [place] [address] [phone]     your pulmonologist     [facility] Emergency Department at [facility] [address] [place] [id number] [phone]       Jillianne Gamino, Canary Brim, MD 12/08/23 1400

## 2023-12-08 NOTE — ED Notes (Signed)
 Pt ambulatory to RR w steady gait. Denies CP currently, advises that he feels "better than when I came in."

## 2023-12-18 ENCOUNTER — Other Ambulatory Visit: Payer: Self-pay | Admitting: Family Medicine

## 2023-12-18 DIAGNOSIS — R918 Other nonspecific abnormal finding of lung field: Secondary | ICD-10-CM

## 2023-12-26 ENCOUNTER — Ambulatory Visit
Admission: RE | Admit: 2023-12-26 | Discharge: 2023-12-26 | Disposition: A | Discharge status: Home / Self Care | Source: Ambulatory Visit | Attending: Family Medicine | Admitting: Family Medicine

## 2023-12-26 DIAGNOSIS — R918 Other nonspecific abnormal finding of lung field: Secondary | ICD-10-CM

## 2023-12-29 ENCOUNTER — Emergency Department (HOSPITAL_BASED_OUTPATIENT_CLINIC_OR_DEPARTMENT_OTHER)

## 2023-12-29 ENCOUNTER — Encounter (HOSPITAL_BASED_OUTPATIENT_CLINIC_OR_DEPARTMENT_OTHER): Payer: Self-pay

## 2023-12-29 ENCOUNTER — Other Ambulatory Visit: Payer: Self-pay

## 2023-12-29 ENCOUNTER — Emergency Department (HOSPITAL_BASED_OUTPATIENT_CLINIC_OR_DEPARTMENT_OTHER)
Admission: EM | Admit: 2023-12-29 | Discharge: 2023-12-29 | Disposition: A | Discharge status: Home / Self Care | Attending: Emergency Medicine | Admitting: Emergency Medicine

## 2023-12-29 DIAGNOSIS — D3501 Benign neoplasm of right adrenal gland: Secondary | ICD-10-CM | POA: Insufficient documentation

## 2023-12-29 DIAGNOSIS — D72829 Elevated white blood cell count, unspecified: Secondary | ICD-10-CM | POA: Insufficient documentation

## 2023-12-29 DIAGNOSIS — R109 Unspecified abdominal pain: Secondary | ICD-10-CM | POA: Diagnosis not present

## 2023-12-29 DIAGNOSIS — R911 Solitary pulmonary nodule: Secondary | ICD-10-CM | POA: Diagnosis not present

## 2023-12-29 DIAGNOSIS — R197 Diarrhea, unspecified: Secondary | ICD-10-CM | POA: Diagnosis present

## 2023-12-29 DIAGNOSIS — D3502 Benign neoplasm of left adrenal gland: Secondary | ICD-10-CM | POA: Insufficient documentation

## 2023-12-29 LAB — COMPREHENSIVE METABOLIC PANEL WITH GFR
ALT: 33 U/L (ref 0–44)
AST: 18 U/L (ref 15–41)
Albumin: 3.8 g/dL (ref 3.5–5.0)
Alkaline Phosphatase: 89 U/L (ref 38–126)
Anion gap: 6 (ref 5–15)
BUN: 17 mg/dL (ref 8–23)
CO2: 26 mmol/L (ref 22–32)
Calcium: 8.9 mg/dL (ref 8.9–10.3)
Chloride: 104 mmol/L (ref 98–111)
Creatinine, Ser: 1.26 mg/dL — ABNORMAL HIGH (ref 0.61–1.24)
GFR, Estimated: >60 mL/min (ref >60)
Glucose, Bld: 105 mg/dL — ABNORMAL HIGH (ref 70–99)
Potassium: 4 mmol/L (ref 3.5–5.1)
Sodium: 136 mmol/L (ref 135–145)
Total Bilirubin: 0.4 mg/dL (ref 0.0–1.2)
Total Protein: 6.8 g/dL (ref 6.5–8.1)

## 2023-12-29 LAB — CBC WITH DIFFERENTIAL/PLATELET
Abs Immature Granulocytes: 0.05 10*3/uL (ref 0.00–0.07)
Basophils Absolute: 0.1 10*3/uL (ref 0.0–0.1)
Basophils Relative: 0 %
Eosinophils Absolute: 1.3 10*3/uL — ABNORMAL HIGH (ref 0.0–0.5)
Eosinophils Relative: 10 %
HCT: 46.2 % (ref 39.0–52.0)
Hemoglobin: 15.6 g/dL (ref 13.0–17.0)
Immature Granulocytes: 0 %
Lymphocytes Relative: 22 %
Lymphs Abs: 2.7 10*3/uL (ref 0.7–4.0)
MCH: 31.8 pg (ref 26.0–34.0)
MCHC: 33.8 g/dL (ref 30.0–36.0)
MCV: 94.1 fL (ref 80.0–100.0)
Monocytes Absolute: 0.8 10*3/uL (ref 0.1–1.0)
Monocytes Relative: 7 %
Neutro Abs: 7.5 10*3/uL (ref 1.7–7.7)
Neutrophils Relative %: 61 %
Platelets: 301 10*3/uL (ref 150–400)
RBC: 4.91 MIL/uL (ref 4.22–5.81)
RDW: 13.2 % (ref 11.5–15.5)
WBC: 12.4 10*3/uL — ABNORMAL HIGH (ref 4.0–10.5)
nRBC: 0 % (ref 0.0–0.2)

## 2023-12-29 LAB — MAGNESIUM: Magnesium: 2.2 mg/dL (ref 1.7–2.4)

## 2023-12-29 LAB — C DIFFICILE QUICK SCREEN W PCR REFLEX
C Diff antigen: NEGATIVE
C Diff interpretation: NOT DETECTED
C Diff toxin: NEGATIVE

## 2023-12-29 MED ORDER — IOHEXOL 300 MG/ML  SOLN
100.0000 mL | Freq: Once | INTRAMUSCULAR | Status: AC | PRN
  Administered 2023-12-29: 100 mL via INTRAVENOUS

## 2023-12-29 MED ORDER — DICYCLOMINE HCL 20 MG PO TABS: 20.0000 mg | ORAL_TABLET | Freq: Two times a day (BID) | ORAL | 0 refills | Status: DC

## 2023-12-29 MED ORDER — DIPHENOXYLATE-ATROPINE 2.5-0.025 MG PO TABS: 1.0000 | ORAL_TABLET | Freq: Four times a day (QID) | ORAL | 0 refills | Status: DC | PRN

## 2023-12-29 NOTE — ED Triage Notes (Signed)
 In for eval of diarrhea for 12 days with abd pain. Denies nausea or vomiting. Denies fever or chills.

## 2023-12-29 NOTE — Discharge Instructions (Signed)
 Please read and follow all provided instructions.  Your diagnoses today include:  1. Diarrhea, unspecified type     Tests performed today include: Complete blood cell count: Slightly high white blood cell count otherwise unremarkable Complete metabolic panel: Normal liver and baseline kidney function Lipase (pancreas function test): Was normal Magnesium: Was normal  GI pathogen panel and C. difficile testing: Pending results vital signs. See below for your results today.   Medications prescribed:  Lomotil: Medication for diarrhea  Bentyl - medication for intestinal cramps and spasms  Take any prescribed medications only as directed.  Home care instructions:  Follow any educational materials contained in this packet.  Follow-up instructions: Please follow-up with your primary care provider in the next 3 days for further evaluation of your symptoms.    Return instructions:  SEEK IMMEDIATE MEDICAL ATTENTION IF: The pain does not go away or becomes severe  A temperature above 101F develops  Repeated vomiting occurs (multiple episodes)  The pain becomes localized to portions of the abdomen. The right side could possibly be appendicitis. In an adult, the left lower portion of the abdomen could be colitis or diverticulitis.  Blood is being passed in stools or vomit (bright red or black tarry stools)  You develop chest pain, difficulty breathing, dizziness or fainting, or become confused, poorly responsive, or inconsolable (young children) If you have any other emergent concerns regarding your health  Additional Information: Abdominal (belly) pain can be caused by many things. Your caregiver performed an examination and possibly ordered blood/urine tests and imaging (CT scan, x-rays, ultrasound). Many cases can be observed and treated at home after initial evaluation in the emergency department. Even though you are being discharged home, abdominal pain can be unpredictable. Therefore,  you need a repeated exam if your pain does not resolve, returns, or worsens. Most patients with abdominal pain don't have to be admitted to the hospital or have surgery, but serious problems like appendicitis and gallbladder attacks can start out as nonspecific pain. Many abdominal conditions cannot be diagnosed in one visit, so follow-up evaluations are very important.  Your vital signs today were: BP 125/80   Pulse 60   Temp 98.1 F (36.7 C)   Resp 16   Ht 6\' 1"  (1.854 m)   Wt 104.3 kg   SpO2 100%   BMI 30.34 kg/m  If your blood pressure (bp) was elevated above 135/85 this visit, please have this repeated by your doctor within one month. --------------

## 2023-12-29 NOTE — ED Provider Notes (Signed)
 [facility] AT [facility] Provider Note   CSN: [id number] Arrival date & time: 12/29/23  6213     History  Chief Complaint  Patient presents with   Diarrhea    Erik Strong is a 63 y.o. male.  Patient with history of pulmonary fibrosis, known adrenal mass -- presents emergency department for ongoing abdominal cramping and diarrhea.  Symptoms started 12 days ago.  Patient has had watery stool without blood every few hours.  He has tried some Pepto-Bismol without improvement.  No associated fevers.  No recent travel or suspicious food or water exposures.  No recent antibiotic use.  He has been in and out of the emergency department and his PCP a few times recently.  He thought that the stool is improving yesterday, as it was more formed, however at 5 AM today he began having watery stool again, prompting emergency department visit this morning.  No history of abdominal surgeries.  No history of inflammatory bowel disease.       Home Medications Prior to Admission medications   Medication Sig Start Date End Date Taking? Authorizing Provider  acetaminophen (TYLENOL) 325 MG tablet Take 2 tablets (650 mg total) by mouth every 6 (six) hours. 09/03/19   Rober Chimera, MD  albuterol (VENTOLIN HFA) 108 (90 Base) MCG/ACT inhaler TAKE 2 PUFFS BY MOUTH EVERY 6 HOURS AS NEEDED FOR WHEEZE OR SHORTNESS OF BREATH 03/01/23   Maire Scot, MD  ALPRAZolam (XANAX) 0.5 MG tablet Take 0.5 mg by mouth at bedtime.    [provider]  amphetamine-dextroamphetamine (ADDERALL) 15 MG tablet Take 30 mg by mouth daily. 12/22/21   [provider]  atorvastatin (LIPITOR) 20 MG tablet Take 1 tablet (20 mg total) by mouth daily. 07/27/23   Wendie Hamburg, MD  ezetimibe (ZETIA) 10 MG tablet TAKE 1 TABLET BY MOUTH EVERY DAY 11/22/23   Wendie Hamburg, MD  levothyroxine (SYNTHROID) 50 MCG tablet Take 50 mcg by mouth daily before breakfast. 08/12/19    [provider]  phenylephrine-shark liver oil-mineral oil-petrolatum (PREPARATION H) 0.25-3-14-71.9 % rectal ointment Place 1 application rectally daily as needed for hemorrhoids.    [provider]  Pirfenidone (ESBRIET) 801 MG TABS Take 1 tablet (801 mg) by mouth in the morning, at noon, and at bedtime. 08/25/23   Maire Scot, MD  sildenafil (REVATIO) 20 MG tablet Take 20 mg by mouth daily as needed (FOR ED).     [provider]  SYMBICORT 160-4.5 MCG/ACT inhaler INHALE 2 PUFFS INTO THE LUNGS TWICE A DAY 05/25/23   Wert, Michael B, MD  tiZANidine (ZANAFLEX) 2 MG tablet Take 2 mg by mouth at bedtime.    [provider]  traMADol (ULTRAM) 50 MG tablet Take 50 mg by mouth See admin instructions. Take one tablet (50 mg) by mouth daily at bedtime, may also take one tablet (50 mg) twice daily as needed for pain 04/03/17   [provider]      Allergies    Other, Codeine, Macrodantin [nitrofurantoin macrocrystal], and Shellfish allergy    Review of Systems   Review of Systems  Physical Exam Updated Vital Signs BP 125/80   Pulse 60   Temp 98.1 F (36.7 C)   Resp 16   Ht 6\' 1"  (1.854 m)   Wt 104.3 kg   SpO2 100%   BMI 30.34 kg/m  Physical Exam Vitals and nursing note reviewed.  Constitutional:      General: He is not  in acute distress.    Appearance: He is well-developed.  HENT:     Head: Normocephalic and atraumatic.     Mouth/Throat:     Mouth: Mucous membranes are moist.  Eyes:     General:        Right eye: No discharge.        Left eye: No discharge.     Conjunctiva/sclera: Conjunctivae normal.  Cardiovascular:     Rate and Rhythm: Normal rate and regular rhythm.     Heart sounds: Normal heart sounds.  Pulmonary:     Effort: Pulmonary effort is normal.     Breath sounds: Normal breath sounds.  Abdominal:     Palpations: Abdomen is soft.     Tenderness: There is no abdominal tenderness. There is no guarding or rebound.   Musculoskeletal:     Cervical back: Normal range of motion and neck supple.     Right lower leg: No edema.     Left lower leg: No edema.  Skin:    General: Skin is warm and dry.  Neurological:     Mental Status: He is alert.     ED Results / Procedures / Treatments   Labs (all labs ordered are listed, but only abnormal results are displayed) Labs Reviewed  CBC WITH DIFFERENTIAL/PLATELET - Abnormal; Notable for the following components:      Result Value   WBC 12.4 (*)    Eosinophils Absolute 1.3 (*)    All other components within normal limits  COMPREHENSIVE METABOLIC PANEL WITH GFR - Abnormal; Notable for the following components:   Glucose, Bld 105 (*)    Creatinine, Ser 1.26 (*)    All other components within normal limits  GASTROINTESTINAL PANEL BY PCR, STOOL (REPLACES STOOL CULTURE)  C DIFFICILE QUICK SCREEN W PCR REFLEX    MAGNESIUM    EKG None  Radiology CT ABDOMEN PELVIS W CONTRAST Result Date: 12/29/2023 CLINICAL DATA:  Abdominal cramping and diarrhea EXAM: CT ABDOMEN AND PELVIS WITH CONTRAST TECHNIQUE: Multidetector CT imaging of the abdomen and pelvis was performed using the standard protocol following bolus administration of intravenous contrast. RADIATION DOSE REDUCTION: This exam was performed according to the departmental dose-optimization program which includes automated exposure control, adjustment of the mA and/or kV according to patient size and/or use of iterative reconstruction technique. CONTRAST:  OMNIPAQUE IOHEXOL 300 MG/ML  SOLN COMPARISON:  December 08, 2023 April 28, 2019, March 28, 2019 FINDINGS: Lower chest: No cardiomegaly. Similar honeycombing dependently within the lower lobes. Increasing reticulation throughout the remainder of the lung bases. Unchanged, mildly spiculated nodular opacity in the subpleural left lower lobe measuring 1.4 cm (axial 16). Hepatobiliary: No mass.No radiopaque stones or wall thickening of the gallbladder.No intrahepatic  or extrahepatic biliary ductal dilation.The portal veins are patent. Pancreas: No mass or main ductal dilation.No peripancreatic inflammation or fluid collection. Spleen: Normal size. No mass. Adrenals/Urinary Tract: Unchanged small bilateral adrenal nodules, consistent with adenomas on the prior MRI. Significant atrophy and regions of cortical scarring in the left kidney. No renal mass. Unchanged focal dilation of the distal left ureter. No upstream hydronephrosis or nephrolithiasis. The urinary bladder is distended without focal abnormality. Stomach/Bowel: The stomach is decompressed without focal abnormality. No small bowel wall thickening or inflammation. No small bowel obstruction. Normal appendix. Vascular/Lymphatic: No aortic aneurysm. Diffuse aortoiliac atherosclerosis. No intraabdominal or pelvic lymphadenopathy. Reproductive: No prostatomegaly.No free pelvic fluid. Other: No pneumoperitoneum, ascites, or mesenteric inflammation. Musculoskeletal: No acute fracture or destructive lesion.Multilevel degenerative disc  disease of the spine. Partially visualized left femoral intramedullary nail. IMPRESSION: 1.  No acute intra-abdominal or pelvic abnormality. 2. Similar honeycombing with slowly increasing subpleural reticulation throughout the lung bases. These findings are most consistent with an interstitial lung disease, worrisome for UIP pattern pulmonary fibrosis. 3. The nodule spiculated nodular opacity in the subpleural left lower lobe measuring 1.4 cm (axial 16), is also unchanged. While this could represent nodular atelectasis, continued follow-up is recommended to exclude underlying malignancy. 4. Unchanged bilateral adrenal adenomas. Electronically Signed   By: Rance Burrows M.D.   On: 12/29/2023 12:31    Procedures Procedures    Medications Ordered in ED Medications  iohexol (OMNIPAQUE) 300 MG/ML solution 100 mL (100 mLs Intravenous Contrast Given 12/29/23 1138)    ED Course/ Medical  Decision Making/ A&P    Patient seen and examined. History obtained directly from patient.  Reviewed recent PCP and emergency department notes.  He did have a CT scan done of his chest a few days ago.  Upper abdomen was normal on that scan.  Labs/EKG: Ordered CBC, CMP, lipase, magnesium.  Will attempt to collect GI pathogen panel and C. difficile.  Imaging: Ordered CT abdomen pelvis  Medications/Fluids: None ordered  Most recent vital signs reviewed and are as follows: BP 125/80   Pulse 60   Temp 98.1 F (36.7 C)   Resp 16   Ht 6\' 1"  (1.854 m)   Wt 104.3 kg   SpO2 100%   BMI 30.34 kg/m   Initial impression: Diarrhea, abdominal pain  1:49 PM Reassessment performed. Patient appears stable.  Patient was able to provide stool study samples.  Labs personally reviewed and interpreted including: CBC with elevated white blood cell count at 12,400, normal hemoglobin, mildly elevated eosinophils; CMP with creatinine at baseline 1.26, BUN of 17, glucose 105; magnesium normal.  Imaging personally visualized and interpreted including: CT scan of the abdomen pelvis, agree no acute findings in the abdomen, chronic stable lung findings.  Reviewed pertinent lab work and imaging with patient at bedside. Questions answered.   Most current vital signs reviewed and are as follows: BP 125/80   Pulse 60   Temp 98.1 F (36.7 C)   Resp 16   Ht 6\' 1"  (1.854 m)   Wt 104.3 kg   SpO2 100%   BMI 30.34 kg/m   Plan: Discharge to home.   Prescriptions written for: Lomotil, Bentyl  Other home care instructions discussed: Maintain good hydration and electrolyte intake  ED return instructions discussed: The patient was urged to return to the Emergency Department immediately with worsening of current symptoms, worsening abdominal pain, persistent vomiting, blood noted in stools, fever, or any other concerns. The patient verbalized understanding.   Follow-up instructions discussed: Patient encouraged  to follow-up with their PCP in 3 days.                                 Medical Decision Making Amount and/or Complexity of Data Reviewed Labs: ordered. Radiology: ordered.  Risk Prescription drug management.   For this patient's complaint of abdominal cramping and diarrhea, the following conditions were considered on the differential diagnosis: gastritis/PUD, enteritis/duodenitis, appendicitis, cholelithiasis/cholecystitis, cholangitis, pancreatitis, ruptured viscus, colitis, diverticulitis, small/large bowel obstruction, proctitis, cystitis, pyelonephritis, ureteral colic, aortic dissection, aortic aneurysm. In women, ectopic pregnancy, pelvic inflammatory disease, ovarian cysts, and tubo-ovarian abscess were also considered. Atypical chest etiologies were also considered including ACS, PE, and pneumonia.  CT was reassuring today.  Mildly elevated white blood cell count however electrolytes and kidney function are at baseline.  Patient was able to provide a sample.  Will await results of that.  Otherwise no indications for admission today.  Will give Lomotil and Bentyl for symptom control.  The patient's vital signs, pertinent lab work and imaging were reviewed and interpreted as discussed in the ED course. Hospitalization was considered for further testing, treatments, or serial exams/observation. However as patient is well-appearing, has a stable exam, and reassuring studies today, I do not feel that they warrant admission at this time. This plan was discussed with the patient who verbalizes agreement and comfort with this plan and seems reliable and able to return to the Emergency Department with worsening or changing symptoms.           Final Clinical Impression(s) / ED Diagnoses Final diagnoses:  Diarrhea, unspecified type    Rx / DC Orders ED Discharge Orders          Ordered    diphenoxylate-atropine (LOMOTIL) 2.5-0.025 MG tablet  4 times daily PRN        12/29/23 1343     dicyclomine (BENTYL) 20 MG tablet  2 times daily        12/29/23 1343              Lyna Sandhoff, PA-C 12/29/23 1351    Hershel Los, MD 12/31/23 2156

## 2023-12-30 LAB — GASTROINTESTINAL PANEL BY PCR, STOOL (REPLACES STOOL CULTURE)

## 2024-01-09 ENCOUNTER — Telehealth: Payer: Self-pay

## 2024-01-09 NOTE — Telephone Encounter (Signed)
 Copied from CRM [id number]. Topic: Clinical - Prescription Issue >> Jan 09, 2024  2:15 PM Whitney O wrote: Reason for CRM: patient is saying that the pharmacy told him that he is on his last refill that he is about to  receive and to call the doctor and let them know to get ready for the next refills Pirfenidone  (ESBRIET ) 801 MG TABS

## 2024-01-13 ENCOUNTER — Other Ambulatory Visit: Payer: Self-pay | Admitting: Cardiology
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
## 2024-01-30 ENCOUNTER — Ambulatory Visit: Admitting: Physician Assistant

## 2024-01-30 ENCOUNTER — Other Ambulatory Visit

## 2024-01-30 ENCOUNTER — Encounter: Payer: Self-pay | Admitting: Physician Assistant

## 2024-01-30 VITALS — BP 128/70 | HR 57 | Ht 73.0 in | Wt 232.4 lb

## 2024-01-30 DIAGNOSIS — R197 Diarrhea, unspecified: Secondary | ICD-10-CM | POA: Diagnosis not present

## 2024-01-30 NOTE — Patient Instructions (Addendum)
   Start OTC Align Probiotic: Take 1 Capsule Once Daily for 30 days. If GI symptoms improve, then OK to continue Align. If GI symptoms do not improve after 30 days, then discontinue.   Your provider has requested that you go to the basement level for lab work before leaving today. Press "B" on the elevator. The lab is located at the first door on the left as you exit the elevator.  Please follow up sooner if symptoms increase or worsen  Due to recent changes in healthcare laws, you may see the results of your imaging and laboratory studies on MyChart before your provider has had a chance to review them.  We understand that in some cases there may be results that are confusing or concerning to you. Not all laboratory results come back in the same time frame and the provider may be waiting for multiple results in order to interpret others.  Please give us  48 hours in order for your provider to thoroughly review all the results before contacting the office for clarification of your results.   _______________________________________________________  If your blood pressure at your visit was 140/90 or greater, please contact your primary care physician to follow up on this.  _______________________________________________________  If you are age 46 or older, your body mass index should be between 23-30. Your Body mass index is 30.66 kg/m. If this is out of the aforementioned range listed, please consider follow up with your Primary Care Provider.  If you are age 15 or younger, your body mass index should be between 19-25. Your Body mass index is 30.66 kg/m. If this is out of the aformentioned range listed, please consider follow up with your Primary Care Provider.   ________________________________________________________  The [facility] providers would like to encourage you to use MYCHART to communicate with providers for non-urgent requests or questions.  Due to long hold times on the  telephone, sending your provider a message by [facility] may be a faster and more efficient way to get a response.  Please allow 48 business hours for a response.  Please remember that this is for non-urgent requests.  _______________________________________________________ Thank you for trusting me with your gastrointestinal care!   Brigitte Canard, PA

## 2024-01-31 ENCOUNTER — Other Ambulatory Visit

## 2024-01-31 DIAGNOSIS — R197 Diarrhea, unspecified: Secondary | ICD-10-CM

## 2024-02-01 ENCOUNTER — Encounter: Payer: Self-pay | Admitting: Internal Medicine

## 2024-02-01 ENCOUNTER — Ambulatory Visit: Admitting: Internal Medicine

## 2024-02-01 VITALS — BP 128/68 | HR 69 | Ht 73.0 in | Wt 229.4 lb

## 2024-02-01 DIAGNOSIS — J84112 Idiopathic pulmonary fibrosis: Secondary | ICD-10-CM

## 2024-02-01 DIAGNOSIS — R911 Solitary pulmonary nodule: Secondary | ICD-10-CM

## 2024-02-01 DIAGNOSIS — F1290 Cannabis use, unspecified, uncomplicated: Secondary | ICD-10-CM | POA: Diagnosis not present

## 2024-02-01 DIAGNOSIS — Z87891 Personal history of nicotine dependence: Secondary | ICD-10-CM

## 2024-02-01 DIAGNOSIS — Z5181 Encounter for therapeutic drug level monitoring: Secondary | ICD-10-CM | POA: Diagnosis not present

## 2024-02-01 DIAGNOSIS — R197 Diarrhea, unspecified: Secondary | ICD-10-CM

## 2024-02-01 MED ORDER — BREZTRI AEROSPHERE 160-9-4.8 MCG/ACT IN AERO: 2.0000 | INHALATION_SPRAY | Freq: Two times a day (BID) | RESPIRATORY_TRACT | Status: DC

## 2024-02-01 NOTE — Progress Notes (Signed)
 OV 11/12/2020  Subjective:  Patient ID: Erik Strong, male , DOB: 1960-12-27 , age 63 y.o. , MRN: [id number] , ADDRESS: [address] [place] [address] PCP Brenita Callow, L.Rozelle Corning, MD Patient Care Team: Brenita Callow, Rolena Click.Rozelle Corning, MD as PCP - General (Family Medicine)  This Provider for this visit: Treatment Team:  Attending Provider: Maire Scot, MD    11/12/2020 -   Chief Complaint  Patient presents with   New Patient (Initial Visit)    Doing ok, winded at times     HPI Erik Strong 63 y.o. -referred by Dr. Vernestine Gondola to the ILD center because of discovery of pulmonary fibrosis he also has associated emphysema.  He says he has been disabled because of back issues.  He says he has severe ADD and sometimes he will have to search for key for 2 hours.  He says he can get overwhelmed easily and it might take multiple visits for him to fully grasp disease discussion and therapeutic management plan and prognosis.  [facility] Comprehensive ILD Questionnaire  Symptoms: Insidious onset of shortness of breath gradually getting worse for the last several years.  Episodic dyspnea present severity scale below.  He does have difficulty keeping up with others of his age.  He does have some arthralgia.  He does have cough he coughs at night he brings up some phlegm.  It is whitish-green.  He does clear his throat he does feel this a tickle in the back of the throat occasional wheezing.  No nausea no vomiting no diarrhea.    Past Medical History : As below but no collagen vascular disease or vasculitis.  Does have chronic kidney disease no history of blood clots   ROS:   Does have fatigue arthralgia he does have cracked skin in his fingers.  Does have arthralgia.  Also has daytime somnolence.  He is not able to use his CPAP no recurrent fever no weight loss.  Does have  FAMILY HISTORY of LUNG DISEASE:   Denies   EXPOSURE HISTORY: Between 1978 and 2018 he smoked 30 cigarettes/day  and then quit.  Does not smoke cigars no smoking pipes no vaping.  Current marijuana user.  In 2003 use some cocaine.  HOME and HOBBY DETAILS : [place]  He is lived in the home for 50 years.  The home itself is 06/06/1961 built and is 62 years old.  There is dampness in the basement he does do some occasional yard work and Geographical information systems officer but otherwise no exposure to mold organic antigens he does have cats and dogs   OCCUPATIONAL HISTORY (122 questions) : He has done work with damp air-conditioned spaces.  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  Done pest control work.  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  Done carpentry done wood trimming.  Done oil heating done asbestos work done [employer] work.  Done epoxy reasons.  Then would work done Environmental manager.  Done plastic work.  Done insulation work.  Done metal grinding done machine operating done industrial work.  Done lack ring done spray painting.  Done waterproofing done 5 working.  Done flooring.  Done metal legs.  Done Printmaker.  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  Narrative & Impression  CLINICAL DATA:  64 year old male with history of asbestos exposure. Intermittent wheezing. Former smoker (quit 4 years ago). Suspected interstitial lung disease.   EXAM: CT CHEST WITHOUT CONTRAST   TECHNIQUE: Multidetector CT imaging of the chest was performed following the standard protocol without intravenous contrast. High resolution imaging of the lungs, as well as inspiratory and expiratory imaging, was performed.   COMPARISON:  No priors.    FINDINGS: Cardiovascular: Heart size is normal. There is no significant pericardial fluid, thickening or pericardial calcification. There is aortic atherosclerosis, as well as atherosclerosis of the great vessels of the mediastinum and the coronary arteries, including calcified atherosclerotic plaque in the left anterior descending and right coronary arteries.   Mediastinum/Nodes: No pathologically enlarged mediastinal or hilar lymph nodes. Please note that accurate exclusion of hilar adenopathy is limited on noncontrast CT scans. Esophagus is unremarkable in appearance. No axillary lymphadenopathy.   Lungs/Pleura: High-resolution images demonstrate patchy regions of ground-glass attenuation, septal thickening, subpleural reticulation, traction bronchiectasis and frank honeycombing. Findings have a craniocaudal gradient. Inspiratory and expiratory imaging is unremarkable. No acute consolidative airspace disease. No pleural effusions. No definite suspicious appearing pulmonary nodules or masses are noted. Mild diffuse bronchial wall thickening with mild centrilobular and paraseptal emphysema.   Upper Abdomen: Low-attenuation adrenal nodules bilaterally measuring 2.9 x 1.9 cm on the right (6 HU) and 2.0 x 1.6 cm on the left (3 HU), compatible with benign adrenal adenomas. Aortic atherosclerosis.   Musculoskeletal: There are no aggressive appearing lytic or blastic lesions noted in the visualized portions of the skeleton.   IMPRESSION: 1. 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 2. There is also mild diffuse bronchial wall thickening with mild centrilobular and paraseptal emphysema; imaging findings suggestive of underlying COPD. 3. Aortic atherosclerosis, in addition to left anterior descending and right coronary artery disease. Please note  that although the presence of coronary artery calcium  documents the presence of coronary artery disease, the severity of this disease and any potential stenosis cannot be assessed on this non-gated CT examination. Assessment for potential risk factor modification, dietary therapy or pharmacologic therapy may be warranted, if clinically indicated. 4. Bilateral adrenal adenomas, as above.   Aortic Atherosclerosis (ICD10-I70.0) and Emphysema (ICD10-J43.9).     Electronically Signed   By: Alexandria Angel M.D.   On: 09/25/2020 14:33      PFT  PFT Results Latest Ref Rng & Units 10/21/2019  FVC-Pre L 4.59  FVC-Predicted Pre % 84  FVC-Post L 4.72  FVC-Predicted Post % 86  Pre FEV1/FVC % % 82  Post FEV1/FCV % % 79  FEV1-Pre L 3.76  FEV1-Predicted Pre % 90  FEV1-Post L 3.75  DLCO uncorrected ml/min/mmHg 27.70  DLCO UNC% % 88  DLCO corrected ml/min/mmHg 27.11  DLCO COR %Predicted % 86  DLVA Predicted % 91  TLC L 7.98  TLC % Predicted % 103  RV % Predicted % 140    Results for Partridge, Elijan A (MRN [id number]) as of 11/12/2020 11:00  Ref. Range 09/07/2020 23:06  Creatinine Latest Ref Range: 0.61 - 1.24 mg/dL 4.09 (H)  Results for Hutcherson, Brantlee A (MRN [id number]) as of 11/12/2020 11:00  Ref. Range 09/07/2020 23:06  BASIC METABOLIC PANEL Unknown Rpt (A)  Sodium Latest Ref Range: 135 -  145 mmol/L 139  Potassium Latest Ref Range: 3.5 - 5.1 mmol/L 4.1  Chloride Latest Ref Range: 98 - 111 mmol/L 100  CO2 Latest Ref Range: 22 - 32 mmol/L 29  Glucose Latest Ref Range: 70 - 99 mg/dL 401 (H)      Creatinine Latest Ref Range: 0.61 - 1.24 mg/dL 0.27 (H)  Calcium  Latest Ref Range: 8.9 - 10.3 mg/dL 9.3  Anion gap Latest Ref Range: 5 - 15  10  GFR, Estimated Latest Ref Range: >60 mL/min 49 (L)  Results for Sanabia, Madex A (MRN [id number]) as of 11/12/2020 11:00  Ref. Range 09/07/2020 23:06  Hemoglobin Latest Ref Range: 13.0 - 17.0 g/dL 47.4    OV 2/59/5638  Subjective:  Patient ID: Erik Strong, male , DOB: 01-07-1961 , age 42 y.o. , MRN: [id number] , ADDRESS: [address] [place] [address] PCP Brenita Callow, L.Rozelle Corning, MD Patient Care Team: Brenita Callow, Rolena Click.Rozelle Corning, MD as PCP - General (Family Medicine)  This Provider for this visit: Treatment Team:  Attending Provider: Maire Scot, MD    02/08/2021 -   Chief Complaint  Patient presents with   Follow-up    Pt states he has been doing okay since last visit. States he still becomes SOB with exertion.   Follow-up idiopathic pulmonary fibrosis `- Diagnoses given 11/13/2020 [myositis panel negative but CK and aldolase slightly elevated]  -Clinical diagnosis based on male gender, classic UIP and serology only being trace positive although age only 39 and prior smoking history  - Last PFT February 2021 -Last high-resolution CT January 2022 -Started pirfenidone  end of March 2022   Associated emphysema alpha-1 MM  -On Symbicort   Associated significant attention deficit disorder  Normal cardiac stress test March 2022  HPI Erik Strong 63 y.o. -returns for follow-up.  He is now on pirfenidone  since end of March 2022.  He saying he is tolerating it fine except for occasional nausea that is very mild.  There are no real problems.  Overall he is feeling good.  Today he is wearing sleeveless T-shirt and shorts.  He has a history of skin cancer.  He says he is not putting or applying sunscreen although he notes that he needs to.  He is now on pirfenidone  as well.  I cautioned him about this.  His last liver function test was in February 2022.  He will have his liver function test today and he is agreed.  His symptom score and walking desaturation test appears stable.  His last pulmonary function test was over a year ago and he is willing to have this scheduled prior to the next visit.   OV 01/25/2022  Subjective:  Patient ID: Erik Strong, male , DOB: May 18, 1961 , age 43 y.o. , MRN: [id number] , ADDRESS: [address] [place] [address] PCP Brenita Callow, L.Rozelle Corning, MD Patient Care Team: Brenita Callow, Rolena Click.Rozelle Corning, MD as PCP - General (Family Medicine)  This Provider for this visit: Treatment Team:  Attending Provider: Maire Scot, MD    01/25/2022 -   Chief Complaint  Patient presents with   Follow-up    Pt states he has been doing okay since last visit. States his breathing is about the same.    HPI ROCKWELL ZELDIN 63 y.o. -returns for follow-up.  It has been a year since I last saw him after that he saw Dr. Waymond Hailey in August 2022 and not followed up.  He continues his pirfenidone  although some 7 weeks ago he started running  out of it and started taking it only 1 or 2 tablets a day.  And then was completely out of it for 1 week.  Now for the last 2 weeks after getting his insurance paperwork sorted out he has been taking it at full dose.  He is tolerating it well.  There is no sunburn or any other side effect.  The main issue is that he does have nausea but no vomiting.  The nausea is mild.  He has no complaints overall he feels stable.  His last lab work was last year.  His last pulmonary function test was 2 years ago last CT scan of the chest was 1 year ago.  He does not smoke.  He denies any new health issues no changes in medications.  He is on disability for his hip and his knee does bother him.  However he does think he can walk a 6-minute walk test.   His symptoms: Walking desaturation appears stable over time.Aaron Aas  His weight is also stable.  He does have OSA but unable to tolerate     OV 05/06/2022  Subjective:  Patient ID: Erik Strong, male , DOB: August 06, 1961 , age 11 y.o. , MRN: [id number] , ADDRESS: [address] [place] [address] PCP Brenita Callow, L.Rozelle Corning, MD Patient Care Team: Brenita Callow, Rolena Click.Rozelle Corning, MD as PCP - General (Family Medicine)  This Provider for this visit: Treatment Team:  Attending Provider: Maire Scot, MD   05/06/2022 -   Chief Complaint  Patient presents with    Follow-up    PFT performed today.  Pt states he has been doing okay since last visit. States his breathing has been doing okay.     HPI ZIGGY DICAPUA 63 y.o. -returns for follow-up.  At this point in time he is stable on pirfenidone .  His symptoms are minimal.  He does say that when he goes out and son there is some burning but there is no rash.  He has noticed that niacin in the past.  He was wondering if it was because of pirfenidone .  I confirmed the same.  But he does use sunscreen and this brings down the bone intensity and definitely no skin rash at any time or erythema.  He has no new complaints.  He had pulmonary function test shows slight reduction that is probably not clinically significant.  He states that he ran out of Symbicort  due to insurance issues few months ago and just ordered it yesterday.  He suspects is because of that.  His symptom score is actually better.  He had high-resolution CT chest in summer 2023 and is stable.  Overall he believes he is stable.   We discussed clinical trials as a care option and coverered the following  CT Chest data  OV 07/05/2022  Subjective:  Patient ID: Erik Strong, male , DOB: August 25, 1961 , age 34 y.o. , MRN: [id number] , ADDRESS: [address] [place] [address] PCP Brenita Callow, L.Rozelle Corning, MD Patient Care Team: Brenita Callow, Rolena Click.Rozelle Corning, MD as PCP - General (Family Medicine)  This Provider for this visit: Treatment Team:  Attending Provider: [place]   [address]/17/2023 -   Chief Complaint  Patient presents with   Follow-up    Cough a little worse. Fever 1 week ago.  SOB about the same.  Would like flu vaccine today     HPI Erik Strong 63 y.o. -returns for routine follow-up.  He states he is doing stable.  Infection from  scores are stable.  A few weeks ago he had temperature of 100.5 COVID test was negative.  He did have some slight increased cough but then this settled.  He is back to baseline.  He is tolerating  pirfenidone  well except for 1 or 2 minutes of dizziness 30 minutes after taking the pirfenidone  it is extremely mild.  His smoking is in remission.  He has not done cocaine in 20 years.  He still smokes marijuana.  He says there is no craving for it he does smoke for fun.  Last use was few days ago.  We spoke about the hazards of smoking marijuana.  He is interested in clinical trials and this will exclude patients who smoke marijuana.  Regardless I have told him about the need to quit marijuana to protect his lung health.  He understands this.  He wants to have his COVID mRNA booster.  He will have it on his own.  This week and is going on a charge trip to [place]  .  He will have his RSV and flu shot today.  We discussed this.   For his emphysema: His son Symbicort  and continues this  He has slightly unexplained elevated CK.  Last check in August 2023 this was coming down.  We will check this again. OV 10/18/2022  Subjective:  Patient ID: Erik Strong, male , DOB: 12/29/1960 , age 19 y.o. , MRN: [id number] , ADDRESS: [address] [place] [address] PCP Brenita Callow, L.Rozelle Corning, MD Patient Care Team: Brenita Callow, Rolena Click.Rozelle Corning, MD as PCP - General (Family Medicine) Sonny Dust, MD as PCP - Cardiology (Cardiology)  This Provider for this visit: Treatment Team:  Attending Provider: Maire Scot, MD  10/18/2022 -   Chief Complaint  Patient presents with   Follow-up    PFT done today. Breathing is unchanged since his last visit. He has occ cough with exertion. He uses albuterol  3-4 x per wk.      HPI Erik Strong 63 y.o. -returns for follow-up.  Last seen in the fall 2023.  He states that his IPF is stable dyspnea stable.  Tolerating pirfenidone  well.  99% compliant.  No new medical problems.  His CK was elevated and his statin got changed.  His most recent CK in October 2023 was normal.  In the interim no urgent care visits no emergency room visits no surgeries no  admissions.  Symptom scores below.  Weight is below.  They are all stable.  He still continues to smoke marijuana on and off.  He is asking for a letter to avoid jury duty.  I support him in this but he does not have the summons with him.  He is interested in clinical research.  We discussed the possibility of participating in [employer] sponsored bone scans DEXA study.  The study involves getting blood for calcium  and liver function test and also spine x-rays.  He believes he might have osteopenia.  Last bone scan was 10 years ago.  He does not have implants no cancers.  No steroid use.  Have asked the research coordinator to prescreen him.  He is reading the consent form and I gave him the consent form.  The study might be closed for accrual/enrollment.  But we will check with the sponsor. -> lter prescreen faile dbecuase of hardware from # femur in past      OV 08/24/2023  Subjective:  Patient ID: Erik Strong, male , DOB: 06/20/1961 , age 81  y.o. , MRN: [id number] , ADDRESS: [address] [place] [address] PCP Benedetto Brady, MD Patient Care Team: Benedetto Brady, MD as PCP - General (Family Medicine) Sonny Dust, MD (Inactive) as PCP - Cardiology (Cardiology)  This Provider for this visit: Treatment Team:  Attending Provider: Maire Scot, MD  08/24/2023 -   Chief Complaint  Patient presents with   Follow-up    Pft f/u       HPI Erik Strong 63 y.o. -presents for IPF follow-up.  Last seen earlier this year.  In the interim the only medication change is that he is no longer taking long-acting Adderall but otherwise taking regular Adderall. Interim Health status: No new complaints No new medical problems. No new surgeries. No ER visits. No Urgent care visits. No changes to medications.  He continues to live with his girlfriend and her son.  That is social support.  He continues to smoke a little marijuana here and there.  In terms of his pulmonary fibrosis he is  stable.  In terms of his pirfenidone  intake not a problem except for some very mild nausea for 5 or 10 minutes after taking pirfenidone .  He feels stable.  He had pulmonary function test and continued stability since he started his Esbriet /pirfenidone .  Last set of liver function normal in June 2024.  He needs another repeat 1 today.  He did discuss about lung transplant as a care option.  Went to the details of transplant.  Explained to him the financial package he needs.  The social support he needs him he also needs to be free of marijuana smoking.  He needs to lose a lot of weight.  At this point in time he is not interested in a referral but just kind of wanted to know his options.  It appears he may not be interested in transplant.  Nevertheless he is too early in the course of his disease.       OV 02/01/2024  Subjective:  Patient ID: Erik Strong, male , DOB: 1960/12/06 , age 67 y.o. , MRN: [id number] , ADDRESS: [address] [place] [address] PCP Benedetto Brady, MD Patient Care Team: Benedetto Brady, MD as PCP - General (Family Medicine) Wendie Hamburg, MD as PCP - Cardiology (Cardiology)  This Provider for this visit: Treatment Team:  Attending Provider: Maire Scot, MD    02/01/2024 -   Chief Complaint  Patient presents with   Follow-up    Recent CT Chest 12/26/23 showed pulmonary nodule. He has been seeing GI for severe diarrhea. Breathing has been stable.       Follow-up idiopathic pulmonary fibrosis `- Diagnoses given 11/13/2020 [myositis panel negative but CK and aldolase slightly elevated]  -Clinical diagnosis based on male gender, classic UIP and serology only being trace positive although age only 52 and prior smoking history  - Last PFT February 2021 -Last high-resolution CT January 2022 -> June 2023 is stable -Started pirfenidone  end of March 2022   Associated emphysema alpha-1 MM  -On Symbicort   Associated significant attention deficit  disorder  OSA unable to tolerate CPAP  Normal cardiac stress test March 2022. Last echo April 2022  Current active marijuana smoker -mild, occ but active as of 08/24/2023  New onset diarrhea spring 2025   HPI DAISY ATTWOOD 63 y.o. -returns for follow-up.  Last seen in December 2024.  He feels his breathing is overall stable.  He is 99% or more compliant with his Esbriet   and he believes he does not have any side effects from this.  However he tells me that for the last 6 weeks or so has had significant diarrhea especially for the last 3 to 4 weeks.  He ended up in [facility].  Some of the chart reviewed.  He has seen gastroenterology.  There is concerned that because of his gardening and tick bites he is having some meat allergy and he subjected himself to food allergy testing.  Further GI workup is in progress.  I did ask him if he was aware that the drug Pirfenidone  (Esbriet ) can occasionally cause diarrhea although more commonly nausea and vomiting and low appetite.  He was surprised by this.  He feels that the drug is not the cause of his diarrhea because his significant other is also having diarrhea at home and therefore they think there is an external source for this.  Nevertheless I counseled him about the possibility of diarrhea from the drugs that I am prescribing pirfenidone .  He has agreed for a temporary holiday on this.  Reviewed recent labs.  Of note all this ER visits have also resulted in the diagnosis of new left lower lobe nodule.  Radiology report in April 2025 shows a greater than 1 cm left lower lobe subpleural nodule and they have recommended a 1 year follow-up.  I disagree with this.  Visualization of the images with him show that the onset of this nodule is in March 2025.  It is present in April 2025.  With his marijuana smoking age and fibrosis history this significant pretest probably for malignancy.  I will follow the original recommendation from March 2025 to do a short-term  follow-up which would be to get a PET scan to at the end of June 2025.  He is naturally nervous about this.  But he is in alignment with the plan.  His symptom scores are below and overall symptoms are stable.     SYMPTOM SCALE - ILD 11/12/2020  02/08/2021 Now on esbriet  225# 01/25/2022 231#.  Pirfenidone  present 05/06/2022 232#  = esbriet  07/05/2022  10/18/2022 229#e esbriet  08/24/2023 esbriet  02/01/2024 esbret  O2 use ra ts ra ra ra ra ra ra  Shortness of Breath 0 -> 5 scale with 5 being worst (score 6 If unable to do)         At rest 1 0/5 1 0 0 0 0 0  Simple tasks - showers, clothes change, eating, shaving 2 1 2  0.5 1 1 1 1   Household (dishes, doing bed, laundry) 2 2 3  0/5 2 1 3 2   Shopping 3 3 2 1 2 2 2 3   Walking level at own pace 1 2 2  1.5 1 2 2 2   Walking up Stairs 3 3 3  2.5 3 3 3 3   Total (30-36) Dyspnea Score 12 11.5 13 6 9 9 11 11   How bad is your cough? 2 1.5 2 1 1 1 1 2   How bad is your fatigue 2 3 3 2 2 3 3 3   How bad is nausea 0 0.5 2 1 1 2  0 0  How bad is vomiting?  0 0 0 0 0 0 1 0  How bad is diarrhea? 0 0 0 0 0 0 2 3  How bad is anxiety? 2 1 2 2 1 2 2 2   How bad is depression 1 0 0 0 1 1 0 2  6    Simple office walk 185 feet x  3 laps goal with forehead probe 11/12/2020  /yf  01/25/2022   O2 used ra ra ra  Number laps completed 3 3 3   Comments about pace Slow pace Mod pae   Resting Pulse Ox/HR 96% and 71/min 100% and 73 100% ad HR 59  Final Pulse Ox/HR 95% and 85/min 97% and 93/,o 99% and HR 76  Desaturated </= 88% no no   Desaturated <= 3% points no Yes, 3   Got Tachycardic >/= 90/min no Myes   Symptoms at end of test Some dsypnea last lap Mild-mod dyspnea   Miscellaneous comments x x stable         SIT STAND TEST - goal 15 times   02/01/2024    O2 used ra   PRobe - finter or forehead finge   Number sit and stand completed - goal 15 15   Time taken to complete 50 sec   Resting Pulse Ox/HR/Dyspnea  96% and 62/min and dyspnea of 1/10    Peak  measures 96 % and 64/min and dyspnea of 4/10   Final Pulse Ox/HR 97% and 69/min and dyspnea of 3/10   Desaturated </= 88% no   Desaturated <= 3% points no   Got Tachycardic >/= 90/min no   Miscellaneous comments x       CT Chest data from date: 12/26/23  - personally visualized and independently interpreted : yes - my findings are: NEW ONSET MARCH 2025. Absent before that and present APRIL 2025 Narrative & Impression  CLINICAL DATA:  Follow-up pulmonary density from prior CT 12/08/2023   EXAM: CT CHEST WITHOUT CONTRAST   TECHNIQUE: Multidetector CT imaging of the chest was performed following the standard protocol without IV contrast.   RADIATION DOSE REDUCTION: This exam was performed according to the departmental dose-optimization program which includes automated exposure control, adjustment of the mA and/or kV according to patient size and/or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

## 2024-02-01 NOTE — Patient Instructions (Addendum)
 Left lower lobe subpleural lung nodule new onset March 2025 greater than 1 cm   - Also present on CT chest April 2025  Plan  - PET scan late June 2025/early July 2025   #Pulmonary fibrosis - IPF  -Concern for progression on CT scan of the chest in April 2025 - Also concerned that chronic diarrhea might be related to Pirfenidone  (Esbriet )  Plan Check liver function test today 02/01/2024 -advse for you to quit MJ to protect your lungs and consider clinical trials - lose weight and get fit - do Spirometry and dlco in end of June 2025/early July 2025 -Regarding Esbriet  [see below because of diarrhea]  #Diarrhea possibly due to drug   -We need to be open to the possibility that the diarrhea is because of the Esbriet   Plan -  - Stop Esbriet  for 10 days and then restart at 1 pill 1 times daily for 1 week and then go to 1 pill 2 times daily for 1 week and then go to 1 pill 3 times daily  - Always take with food  - Let us  know if diarrhea comes back; can consider CAROB FLour for drug diarrha  Marijuana use  - helps nausea but in long term damanging to lungs  Plan - quit MJ use    Followup  - End of June 2025/early July 2025 after PET scan and breathing test

## 2024-02-02 LAB — CALPROTECTIN, FECAL: Calprotectin, Fecal: 15 ug/g (ref 0–120)

## 2024-02-03 LAB — FOOD ALLERGY PROFILE
Allergen, Salmon, f41: <0.1 kU/L
Almonds: <0.1 kU/L
Brazil Nut: <0.1 kU/L
CLASS: 0
CLASS: 0
CLASS: 0
CLASS: 0
CLASS: 0
CLASS: 0
CLASS: 0
CLASS: 0
CLASS: 0
CLASS: 0
CLASS: 0
CLASS: 0
Cashew IgE: <0.1 kU/L
Class: 0
Class: 0
Class: 0
Class: 0
Egg White IgE: <0.1 kU/L
Fish Cod: <0.1 kU/L
Hazelnut: <0.1 kU/L
Macadamia Nut: <0.1 kU/L
Milk IgE: <0.1 kU/L
Peanut IgE: 0.1 kU/L — ABNORMAL HIGH
Scallop IgE: <0.1 kU/L
Sesame Seed f10: <0.1 kU/L
Shrimp IgE: <0.1 kU/L
Soybean IgE: <0.1 kU/L
Tuna IgE: <0.1 kU/L
Walnut: <0.1 kU/L
Wheat IgE: <0.1 kU/L

## 2024-02-03 LAB — CELIAC DISEASE AB SCREEN W/RFX
Antigliadin Abs, IgA: 4 U (ref 0–19)
IgA/Immunoglobulin A, Serum: 370 mg/dL (ref 61–437)
Transglutaminase IgA: <2 U/mL (ref 0–3)

## 2024-02-03 LAB — ALPHA-GAL PANEL
Allergen, Mutton, f88: 0.18 kU/L — ABNORMAL HIGH
Allergen, Pork, f26: <0.1 kU/L
Beef: 0.42 kU/L — ABNORMAL HIGH
CLASS: 0
CLASS: 1
GALACTOSE-ALPHA-1,3-GALACTOSE IGE*: 8.32 kU/L — ABNORMAL HIGH (ref <0.10)

## 2024-02-03 LAB — PEANUT COMPONENT PANEL REFLEX
Ara h 1 (f422): <0.1 kU/L (ref <0.10)
Ara h 2 (f423): <0.1 kU/L (ref <0.10)
Ara h 3 (f424): <0.1 kU/L (ref <0.10)
Ara h 8 (f352): <0.1 kU/L (ref <0.10)
Ara h 9 (f427: <0.1 kU/L (ref <0.10)
F447-IgE Ara h 6: <0.1 kU/L (ref <0.10)

## 2024-02-03 LAB — INTERPRETATION:

## 2024-02-05 ENCOUNTER — Ambulatory Visit: Payer: Self-pay | Admitting: Physician Assistant

## 2024-02-05 ENCOUNTER — Other Ambulatory Visit: Payer: Self-pay | Admitting: Internal Medicine

## 2024-02-05 ENCOUNTER — Telehealth: Payer: Self-pay | Admitting: *Deleted

## 2024-02-05 DIAGNOSIS — Z5181 Encounter for therapeutic drug level monitoring: Secondary | ICD-10-CM

## 2024-02-05 NOTE — Telephone Encounter (Signed)
 Copied from CRM [id number]. Topic: General - Other >> Feb 01, 2024  3:59 PM Crist Dominion wrote: Reason for CRM: Patient states he just left his appointment with Dr. Bertrum Brodie and states that on his paper it says he needs to check his liver function on 5/15 but there are no labs ordered and patient was not made aware of this. Please call patient back to clarify.   Lab ordered and I spoke with the pt and notified him he can come by at his convenience to have this done. He will come in the am tomorrow. Nothing further needed.

## 2024-02-06 LAB — PANCREATIC ELASTASE, FECAL: Pancreatic Elastase-1, Stool: >800 ug/g (ref >200)

## 2024-02-07 LAB — HEPATIC FUNCTION PANEL
ALT: 36 U/L (ref 0–53)
AST: 25 U/L (ref 0–37)
Albumin: 4.1 g/dL (ref 3.5–5.2)
Alkaline Phosphatase: 69 U/L (ref 39–117)
Bilirubin, Direct: 0.2 mg/dL (ref 0.0–0.3)
Total Bilirubin: 0.6 mg/dL (ref 0.2–1.2)
Total Protein: 6.9 g/dL (ref 6.0–8.3)

## 2024-02-08 ENCOUNTER — Encounter: Payer: Self-pay | Admitting: Cardiology

## 2024-02-13 ENCOUNTER — Ambulatory Visit: Payer: Self-pay | Admitting: Internal Medicine

## 2024-02-13 NOTE — Progress Notes (Signed)
LFT nromal

## 2024-02-16 NOTE — Progress Notes (Signed)
 Agree with the assessment and plan as outlined by Brigitte Canard, PA-C.
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
## 2024-02-28 ENCOUNTER — Ambulatory Visit: Admitting: Physician Assistant

## 2024-02-28 ENCOUNTER — Encounter: Payer: Self-pay | Admitting: Physician Assistant

## 2024-02-28 VITALS — BP 118/72 | HR 63 | Ht 73.0 in | Wt 224.2 lb

## 2024-02-28 DIAGNOSIS — Z860101 Personal history of adenomatous and serrated colon polyps: Secondary | ICD-10-CM | POA: Diagnosis not present

## 2024-02-28 DIAGNOSIS — Z860100 Personal history of colon polyps, unspecified: Secondary | ICD-10-CM

## 2024-02-28 DIAGNOSIS — Z91018 Allergy to other foods: Secondary | ICD-10-CM

## 2024-02-28 DIAGNOSIS — R197 Diarrhea, unspecified: Secondary | ICD-10-CM | POA: Diagnosis not present

## 2024-02-28 DIAGNOSIS — Z91014 Allergy to mammalian meats: Secondary | ICD-10-CM | POA: Diagnosis not present

## 2024-02-28 NOTE — Patient Instructions (Signed)
 Please follow up in 1 year  Please follow up sooner if symptoms increase or worsen  Due to recent changes in healthcare laws, you may see the results of your imaging and laboratory studies on MyChart before your provider has had a chance to review them.  We understand that in some cases there may be results that are confusing or concerning to you. Not all laboratory results come back in the same time frame and the provider may be waiting for multiple results in order to interpret others.  Please give us  48 hours in order for your provider to thoroughly review all the results before contacting the office for clarification of your results.   Thank you for trusting me with your gastrointestinal care!   Brigitte Canard, PA-C _______________________________________________________  If your blood pressure at your visit was 140/90 or greater, please contact your primary care physician to follow up on this.  _______________________________________________________  If you are age 83 or older, your body mass index should be between 23-30. Your Body mass index is 29.59 kg/m. If this is out of the aforementioned range listed, please consider follow up with your Primary Care Provider.  If you are age 62 or younger, your body mass index should be between 19-25. Your Body mass index is 29.59 kg/m. If this is out of the aformentioned range listed, please consider follow up with your Primary Care Provider.   ________________________________________________________  The [facility] providers would like to encourage you to use MYCHART to communicate with providers for non-urgent requests or questions.  Due to long hold times on the telephone, sending your provider a message by [facility] may be a faster and more efficient way to get a response.  Please allow 48 business hours for a response.  Please remember that this is for non-urgent requests.  _______________________________________________________

## 2024-03-11 ENCOUNTER — Encounter (HOSPITAL_COMMUNITY)
Admission: RE | Admit: 2024-03-11 | Discharge: 2024-03-11 | Disposition: A | Discharge status: Home / Self Care | Source: Ambulatory Visit | Attending: Internal Medicine | Admitting: Internal Medicine

## 2024-03-11 DIAGNOSIS — R197 Diarrhea, unspecified: Secondary | ICD-10-CM | POA: Insufficient documentation

## 2024-03-11 DIAGNOSIS — Z5181 Encounter for therapeutic drug level monitoring: Secondary | ICD-10-CM | POA: Insufficient documentation

## 2024-03-11 DIAGNOSIS — J84112 Idiopathic pulmonary fibrosis: Secondary | ICD-10-CM | POA: Insufficient documentation

## 2024-03-11 NOTE — Progress Notes (Signed)
 Agree with the assessment and plan as outlined by Brigitte Canard, PA-C.

## 2024-03-20 ENCOUNTER — Telehealth: Payer: Self-pay | Admitting: Physician Assistant

## 2024-03-20 DIAGNOSIS — R509 Fever, unspecified: Secondary | ICD-10-CM

## 2024-03-20 DIAGNOSIS — R197 Diarrhea, unspecified: Secondary | ICD-10-CM

## 2024-03-20 NOTE — Telephone Encounter (Signed)
 Left message for patient to call back

## 2024-03-20 NOTE — Telephone Encounter (Signed)
 Patient states that over the last several days, he has experienced loss of appetite with around 4-5 pound weight loss. States yesterday all I was able to get down all day was a half bag of fries. Patient also c/o bowel movements that are pure water and notes that he got up between 4-5 times last night to have bowel movements. Denies any abdominal pain. No vomiting, some slight nausea. Patient also states that he has had a fever intermittently over the last couple days reaching around 101F but this often resolves spontaneously.   Patient states that has had been avoiding beef and lamb but still consumes pork since it doesn't bother him (has alpha gal).   Patient denies any recent sick contacts or recent antibiotic use (though 02/28/24 note indicates he was on doxycycline  for tick bite).  He does have dicyclomine  on hand as well as lomotil  as previously prescribed.  Advised patient that he may take dicyclomine  but should wait on lomotil  usage until response from [facility] is received with recommendations. He verbalizes understanding.

## 2024-03-20 NOTE — Telephone Encounter (Signed)
 Inbound call from patient, states he has lose appetite due to his diarrhea. He states he has not been able to eat the past couple of days. He states his stools are watery and last night it was green. He stateshe has lost 4 pounds in the past week and is worried. He would like to speak to a nurse to further advise.

## 2024-03-21 ENCOUNTER — Other Ambulatory Visit

## 2024-03-21 ENCOUNTER — Ambulatory Visit: Payer: Self-pay | Admitting: Physician Assistant

## 2024-03-21 DIAGNOSIS — R197 Diarrhea, unspecified: Secondary | ICD-10-CM

## 2024-03-21 DIAGNOSIS — R509 Fever, unspecified: Secondary | ICD-10-CM | POA: Diagnosis not present

## 2024-03-21 LAB — COMPREHENSIVE METABOLIC PANEL WITH GFR
ALT: 47 U/L (ref 0–53)
AST: 36 U/L (ref 0–37)
Albumin: 4.2 g/dL (ref 3.5–5.2)
Alkaline Phosphatase: 80 U/L (ref 39–117)
BUN: 13 mg/dL (ref 6–23)
CO2: 27 meq/L (ref 19–32)
Calcium: 9.3 mg/dL (ref 8.4–10.5)
Chloride: 106 meq/L (ref 96–112)
Creatinine, Ser: 1.27 mg/dL (ref 0.40–1.50)
GFR: 60.89 mL/min (ref >60.00)
Glucose, Bld: 111 mg/dL — ABNORMAL HIGH (ref 70–99)
Potassium: 3.8 meq/L (ref 3.5–5.1)
Sodium: 139 meq/L (ref 135–145)
Total Bilirubin: 0.4 mg/dL (ref 0.2–1.2)
Total Protein: 7.7 g/dL (ref 6.0–8.3)

## 2024-03-21 LAB — CBC WITH DIFFERENTIAL/PLATELET
Basophils Absolute: 0 10*3/uL (ref 0.0–0.1)
Basophils Relative: 0.5 % (ref 0.0–3.0)
Eosinophils Absolute: 0 10*3/uL (ref 0.0–0.7)
Eosinophils Relative: 0.6 % (ref 0.0–5.0)
HCT: 49 % (ref 39.0–52.0)
Hemoglobin: 16.3 g/dL (ref 13.0–17.0)
Lymphocytes Relative: 29.9 % (ref 12.0–46.0)
Lymphs Abs: 2.2 10*3/uL (ref 0.7–4.0)
MCHC: 33.4 g/dL (ref 30.0–36.0)
MCV: 93 fl (ref 78.0–100.0)
Monocytes Absolute: 0.9 10*3/uL (ref 0.1–1.0)
Monocytes Relative: 11.6 % (ref 3.0–12.0)
Neutro Abs: 4.3 10*3/uL (ref 1.4–7.7)
Neutrophils Relative %: 57.4 % (ref 43.0–77.0)
Platelets: 275 10*3/uL (ref 150.0–400.0)
RBC: 5.27 Mil/uL (ref 4.22–5.81)
RDW: 14 % (ref 11.5–15.5)
WBC: 7.4 10*3/uL (ref 4.0–10.5)

## 2024-03-21 NOTE — Telephone Encounter (Signed)
 Spoke to patient. He states that since yesterday evening, his diarrhea has resolved. States he has passed gas and has not had any additional bowel movements or additional fever. Patient is advised of previous recommendations from [facility] but is advised if he has no additional watery bowel movements, he will need to hold off on c difficile testing. Patient verbalizes understanding.

## 2024-03-29 ENCOUNTER — Encounter (HOSPITAL_COMMUNITY)
Admission: RE | Admit: 2024-03-29 | Discharge: 2024-03-29 | Disposition: A | Discharge status: Home / Self Care | Source: Ambulatory Visit | Attending: Internal Medicine | Admitting: Internal Medicine

## 2024-03-29 DIAGNOSIS — R197 Diarrhea, unspecified: Secondary | ICD-10-CM | POA: Diagnosis present

## 2024-03-29 DIAGNOSIS — J84112 Idiopathic pulmonary fibrosis: Secondary | ICD-10-CM | POA: Insufficient documentation

## 2024-03-29 DIAGNOSIS — R918 Other nonspecific abnormal finding of lung field: Secondary | ICD-10-CM | POA: Insufficient documentation

## 2024-03-29 DIAGNOSIS — J439 Emphysema, unspecified: Secondary | ICD-10-CM | POA: Diagnosis not present

## 2024-03-29 DIAGNOSIS — Z5181 Encounter for therapeutic drug level monitoring: Secondary | ICD-10-CM | POA: Insufficient documentation

## 2024-03-29 LAB — GLUCOSE, CAPILLARY: Glucose-Capillary: 113 mg/dL — ABNORMAL HIGH (ref 70–99)

## 2024-03-29 MED ORDER — FLUDEOXYGLUCOSE F - 18 (FDG) INJECTION
10.9300 | Freq: Once | INTRAVENOUS | Status: AC | PRN
  Administered 2024-03-29: 10.93 via INTRAVENOUS

## 2024-04-06 ENCOUNTER — Ambulatory Visit: Payer: Self-pay | Admitting: Internal Medicine

## 2024-04-06 NOTE — Progress Notes (Signed)
 Dear [name], please keep [name] to discuss your PET scan results. You likley need referral for biopsy  Thank you  dR R  Xxxxxxxxxxxx  IMPRESSION: Interval enlargement of 17 mm subpleural nodule in posterior left lower lobe, which shows marked hypermetabolism, highly suspicious for primary bronchogenic carcinoma.   Stable 7 mm right lower lobe pulmonary nodule, which shows no FDG uptake. Recommend continued attention on follow-up imaging.   No evidence of metastatic disease.   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     Electronically Signed   By: Norleen DELENA Kil M.D.   On: 03/29/2024 13:44

## 2024-04-08 NOTE — Progress Notes (Signed)
 Thanks MR, I will discuss with her at follow-up

## 2024-04-11 ENCOUNTER — Ambulatory Visit: Admitting: Primary Care

## 2024-04-11 ENCOUNTER — Encounter: Payer: Self-pay | Admitting: Primary Care

## 2024-04-11 VITALS — BP 126/72 | HR 52 | Temp 97.9°F | Ht 73.0 in | Wt 221.0 lb

## 2024-04-11 DIAGNOSIS — J84112 Idiopathic pulmonary fibrosis: Secondary | ICD-10-CM

## 2024-04-11 DIAGNOSIS — R918 Other nonspecific abnormal finding of lung field: Secondary | ICD-10-CM | POA: Diagnosis not present
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
## 2024-04-11 NOTE — Progress Notes (Signed)
 I agree with the plans as outlined above.   Lamar Chris, MD, PhD 04/11/2024, 1:17 PM [facility] [phone] or if no answer before 7:00PM call [phone] For any issues after 7:00PM please call eLink [phone]

## 2024-04-11 NOTE — Patient Instructions (Addendum)
 PET scan concerning for left lower lobe lung cancer. __________________________________________________________________  VISIT SUMMARY: You came in today to review the results of your recent PET scan, which showed a new nodule in your left lower lung that has grown to 17 millimeters. We discussed your history of idiopathic pulmonary fibrosis (IPF) and your current treatment with Esbriet . We also reviewed your mild emphysema and the improvement in your chronic diarrhea after dietary changes.  YOUR PLAN: -SUSPICIOUS LUNG NODULE: A new nodule in your left lower lung has grown to 17 millimeters and shows signs that it could be a type of lung cancer called primary bronchogenic carcinoma.  I have sent a message to Dr. Shelah who would perform lung biopsy as well as cardiothoracic surgery to evaluate for possible surgical options vs navigation bronchoscopy for diagnosis/treatment   -IDIOPATHIC PULMONARY FIBROSIS (IPF): IPF is a chronic lung disease that causes scarring of the lung tissue. Your condition is being managed with Esbriet , which you have restarted with a gradual increase in dosage. Your diarrhea has improved with dietary changes.  -MILD EMPHYSEMA: Emphysema is a condition where the air sacs in the lungs are damaged, causing breathlessness. Your case is mild and was noted on your imaging.  -CHRONIC DIARRHEA POSSIBLY RELATED TO MEDICATION: You have experienced chronic diarrhea, likely due to your medication Esbriet . This has improved with the avoidance of beef and the gradual reintroduction of the medication.  INSTRUCTIONS: You will be referred to Dr. Byrum for a bronchoscopy to obtain a tissue sample from the lung nodule. Depending on the results, you may also need to see a cardiothoracic surgeon to discuss potential surgical removal options. Continue taking Esbriet  as directed and avoid beef to manage your diarrhea.   Follow-up 1-2 months with Dr. Geronimo - 30 mins slot

## 2024-04-11 NOTE — Telephone Encounter (Signed)
 Do you want Evisia classification as well or just cytology

## 2024-04-15 ENCOUNTER — Other Ambulatory Visit: Payer: Self-pay | Admitting: Internal Medicine

## 2024-04-15 NOTE — Telephone Encounter (Signed)
 Copied from CRM [id number]. Topic: Clinical - Medication Refill >> Apr 15, 2024 10:16 AM Leila BROCKS wrote: Most Recent Pulmonary Care Visit:  Provider: NP, Almarie Ferrari  Department: [facility] Pulmonary Care Date: 04/11/24  Medication(s): albuterol  (VENTOLIN  HFA) 108 (90 Base) MCG/ACT inhaler   Has the patient contacted their pharmacy? Yes (Agent: If no, request that the patient contact the pharmacy for the refill. If patient does not wish to contact the pharmacy document the reason why and proceed with request.) (Agent: If yes, when and what did the pharmacy advise?)  This is the patient's preferred pharmacy:  [id number] [place], [place] - [address] [place] [place] [address] Phone: [phone] Fax: [phone]  Is this the correct pharmacy for this prescription? Yes If no, delete pharmacy and type the correct one.   Has the prescription been filled recently? No  Is the patient out of the medication? Yes  Has the patient been seen for an appointment in the last year OR does the patient have an upcoming appointment? Yes, 05/10/24 with Dr. Geronimo  Can we respond through MyChart? Yes  Agent: Please be advised that Rx refills may take up to 3 business days. We ask that you follow-up with your pharmacy.

## 2024-05-02 ENCOUNTER — Telehealth: Payer: Self-pay | Admitting: Internal Medicine

## 2024-05-06 ENCOUNTER — Telehealth: Payer: Self-pay | Admitting: Internal Medicine

## 2024-05-06 DIAGNOSIS — J84112 Idiopathic pulmonary fibrosis: Secondary | ICD-10-CM

## 2024-05-06 MED ORDER — PIRFENIDONE 801 MG PO TABS: 801.0000 mg | ORAL_TABLET | Freq: Three times a day (TID) | ORAL | 1 refills | Status: AC

## 2024-05-06 NOTE — Telephone Encounter (Signed)
 Refill sent for ESBRIET  to [facility] for Esbriet : [phone]  Dose: 801mg  three times daily  Last OV: 04/11/2024 Provider: Dr. Geronimo Pertinent labs: LFTs on 03/21/2024 wnl  Next OV: 05/10/2024  Called patient ot advise.  Sherry Pennant, PharmD, MPH, BCPS Clinical Pharmacist (Rheumatology and Pulmonology)

## 2024-05-06 NOTE — Telephone Encounter (Signed)
 Copied from CRM #[id number]. Topic: Clinical - Prescription Issue >> May 06, 2024 10:33 AM Rilla B wrote: Patient states he call in for a refill last week of his  Pirfendone. States he's call the pharmacy and they don't have it.  Patient is anxious and afraid he will run out. I did remind patient of our 3 day processing for prescriptions and did tell him they may even receive it today.  Please call patient @ [phone].     ----------------------------------------------------------------------- From previous Reason for Contact - Other: Reason for CRM:

## 2024-05-06 NOTE — Telephone Encounter (Signed)
 Perfinidone

## 2024-05-09 NOTE — Progress Notes (Unsigned)
 OV 11/12/2020  Subjective:  Patient ID: Erik Strong, male , DOB: 20-Dec-1960 , age 63 y.o. , MRN: [id number] , ADDRESS: [address] [place] [address] PCP Merilee, L.Addie, MD Patient Care Team: Merilee, LITTIE.Addie, MD as PCP - General (Family Medicine)  This Provider for this visit: Treatment Team:  Attending Provider: Geronimo Amel, MD    11/12/2020 -   Chief Complaint  Patient presents with   New Patient (Initial Visit)    Doing ok, winded at times     HPI Erik Strong 63 y.o. -referred by Dr. Ozell America to the ILD center because of discovery of pulmonary fibrosis he also has associated emphysema.  He says he has been disabled because of back issues.  He says he has severe ADD and sometimes he will have to search for key for 2 hours.  He says he can get overwhelmed easily and it might take multiple visits for him to fully grasp disease discussion and therapeutic management plan and prognosis.  [facility] Comprehensive ILD Questionnaire  Symptoms: Insidious onset of shortness of breath gradually getting worse for the last several years.  Episodic dyspnea present severity scale below.  He does have difficulty keeping up with others of his age.  He does have some arthralgia.  He does have cough he coughs at night he brings up some phlegm.  It is whitish-green.  He does clear his throat he does feel this a tickle in the back of the throat occasional wheezing.  No nausea no vomiting no diarrhea.    Past Medical History : As below but no collagen vascular disease or vasculitis.  Does have chronic kidney disease no history of blood clots   ROS:   Does have fatigue arthralgia he does have cracked skin in his fingers.  Does have arthralgia.  Also has daytime somnolence.  He is not able to use his CPAP no recurrent fever no weight loss.  Does have  FAMILY HISTORY of LUNG DISEASE:   Denies   EXPOSURE HISTORY: Between 1978 and 2018 he smoked 30  cigarettes/day and then quit.  Does not smoke cigars no smoking pipes no vaping.  Current marijuana user.  In 2003 use some cocaine.  HOME and HOBBY DETAILS : [place]  He is lived in the home for 50 years.  The home itself is 06/06/1961 built and is 62 years old.  There is dampness in the basement he does do some occasional yard work and Geographical information systems officer but otherwise no exposure to mold organic antigens he does have cats and dogs   OCCUPATIONAL HISTORY (122 questions) : He has done work with damp air-conditioned spaces.  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  Done pest control work.  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  Done carpentry done wood trimming.  Done oil heating done asbestos work done [employer] work.  Done epoxy reasons.  Then would work done Environmental manager.  Done plastic work.  Done insulation work.  Done metal grinding done machine operating done industrial work.  Done lack ring done spray painting.  Done waterproofing done 5 working.  Done flooring.  Done metal legs.  Done Printmaker.  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  Narrative & Impression  CLINICAL DATA:  63 year old male with history of asbestos exposure. Intermittent wheezing. Former smoker (quit 4 years ago). Suspected interstitial lung disease.   EXAM: CT CHEST WITHOUT CONTRAST   TECHNIQUE: Multidetector CT imaging of the chest was performed following the standard protocol without intravenous contrast. High resolution imaging of the lungs, as well as inspiratory and expiratory imaging, was performed.   COMPARISON:  No priors.    FINDINGS: Cardiovascular: Heart size is normal. There is no significant pericardial fluid, thickening or pericardial calcification. There is aortic atherosclerosis, as well as atherosclerosis of the great vessels of the mediastinum and the coronary arteries, including calcified atherosclerotic plaque in the left anterior descending and right coronary arteries.   Mediastinum/Nodes: No pathologically enlarged mediastinal or hilar lymph nodes. Please note that accurate exclusion of hilar adenopathy is limited on noncontrast CT scans. Esophagus is unremarkable in appearance. No axillary lymphadenopathy.   Lungs/Pleura: High-resolution images demonstrate patchy regions of ground-glass attenuation, septal thickening, subpleural reticulation, traction bronchiectasis and frank honeycombing. Findings have a craniocaudal gradient. Inspiratory and expiratory imaging is unremarkable. No acute consolidative airspace disease. No pleural effusions. No definite suspicious appearing pulmonary nodules or masses are noted. Mild diffuse bronchial wall thickening with mild centrilobular and paraseptal emphysema.   Upper Abdomen: Low-attenuation adrenal nodules bilaterally measuring 2.9 x 1.9 cm on the right (6 HU) and 2.0 x 1.6 cm on the left (3 HU), compatible with benign adrenal adenomas. Aortic atherosclerosis.   Musculoskeletal: There are no aggressive appearing lytic or blastic lesions noted in the visualized portions of the skeleton.   IMPRESSION: 1. 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 2. There is also mild diffuse bronchial wall thickening with mild centrilobular and paraseptal emphysema; imaging findings suggestive of underlying COPD. 3. Aortic atherosclerosis, in addition to left anterior descending and right coronary artery disease. Please note  that although the presence of coronary artery calcium  documents the presence of coronary artery disease, the severity of this disease and any potential stenosis cannot be assessed on this non-gated CT examination. Assessment for potential risk factor modification, dietary therapy or pharmacologic therapy may be warranted, if clinically indicated. 4. Bilateral adrenal adenomas, as above.   Aortic Atherosclerosis (ICD10-I70.0) and Emphysema (ICD10-J43.9).     Electronically Signed   By: Toribio Aye M.D.   On: 09/25/2020 14:33      PFT  PFT Results Latest Ref Rng & Units 10/21/2019  FVC-Pre L 4.59  FVC-Predicted Pre % 84  FVC-Post L 4.72  FVC-Predicted Post % 86  Pre FEV1/FVC % % 82  Post FEV1/FCV % % 79  FEV1-Pre L 3.76  FEV1-Predicted Pre % 90  FEV1-Post L 3.75  DLCO uncorrected ml/min/mmHg 27.70  DLCO UNC% % 88  DLCO corrected ml/min/mmHg 27.11  DLCO COR %Predicted % 86  DLVA Predicted % 91  TLC L 7.98  TLC % Predicted % 103  RV % Predicted % 140    Results for Laviolette, Abel A (MRN [id number]) as of 11/12/2020 11:00  Ref. Range 09/07/2020 23:06  Creatinine Latest Ref Range: 0.61 - 1.24 mg/dL 8.36 (H)  Results for Mcdermid, Gannon A (MRN [id number]) as of 11/12/2020 11:00  Ref. Range 09/07/2020 23:06  BASIC METABOLIC PANEL Unknown Rpt (A)  Sodium Latest Ref Range: 135 -  145 mmol/L 139  Potassium Latest Ref Range: 3.5 - 5.1 mmol/L 4.1  Chloride Latest Ref Range: 98 - 111 mmol/L 100  CO2 Latest Ref Range: 22 - 32 mmol/L 29  Glucose Latest Ref Range: 70 - 99 mg/dL 895 (H)      Creatinine Latest Ref Range: 0.61 - 1.24 mg/dL 8.36 (H)  Calcium  Latest Ref Range: 8.9 - 10.3 mg/dL 9.3  Anion gap Latest Ref Range: 5 - 15  10  GFR, Estimated Latest Ref Range: >60 mL/min 49 (L)  Results for Barreira, Navy A (MRN [id number]) as of 11/12/2020 11:00  Ref. Range 09/07/2020 23:06  Hemoglobin Latest Ref Range: 13.0 - 17.0 g/dL 83.3    OV 4/76/7977  Subjective:  Patient ID: Erik Strong, male , DOB: Jan 12, 1961 , age 70 y.o. , MRN: [id number] , ADDRESS: [address] [place] [address] PCP Merilee, L.Addie, MD Patient Care Team: Merilee, LITTIE.Addie, MD as PCP - General (Family Medicine)  This Provider for this visit: Treatment Team:  Attending Provider: Geronimo Amel, MD    02/08/2021 -   Chief Complaint  Patient presents with   Follow-up    Pt states he has been doing okay since last visit. States he still becomes SOB with exertion.   Follow-up idiopathic pulmonary fibrosis `- Diagnoses given 11/13/2020 [myositis panel negative but CK and aldolase slightly elevated]  -Clinical diagnosis based on male gender, classic UIP and serology only being trace positive although age only 20 and prior smoking history  - Last PFT February 2021 -Last high-resolution CT January 2022 -Started pirfenidone  end of March 2022   Associated emphysema alpha-1 MM  -On Symbicort   Associated significant attention deficit disorder  Normal cardiac stress test March 2022  HPI Erik Strong 63 y.o. -returns for follow-up.  He is now on pirfenidone  since end of March 2022.  He saying he is tolerating it fine except for occasional nausea that is very mild.  There are no real problems.  Overall he is feeling good.  Today he is wearing sleeveless T-shirt and shorts.  He has a history of skin cancer.  He says he is not putting or applying sunscreen although he notes that he needs to.  He is now on pirfenidone  as well.  I cautioned him about this.  His last liver function test was in February 2022.  He will have his liver function test today and he is agreed.  His symptom score and walking desaturation test appears stable.  His last pulmonary function test was over a year ago and he is willing to have this scheduled prior to the next visit.   OV 01/25/2022  Subjective:  Patient ID: Erik Strong, male , DOB: October 01, 1960 , age 24 y.o. , MRN: [id number] , ADDRESS: [address] [place] [address] PCP Merilee, L.Addie, MD Patient Care Team: Merilee, LITTIE.Addie, MD as PCP - General (Family Medicine)  This Provider for this visit: Treatment Team:  Attending Provider: Geronimo Amel, MD    01/25/2022 -   Chief Complaint  Patient presents with   Follow-up    Pt states he has been doing okay since last visit. States his breathing is about the same.    HPI KHAYMAN KIRSCH 63 y.o. -returns for follow-up.  It has been a year since I last saw him after that he saw Dr. Darlean in August 2022 and not followed up.  He continues his pirfenidone  although some 7 weeks ago he started running  out of it and started taking it only 1 or 2 tablets a day.  And then was completely out of it for 1 week.  Now for the last 2 weeks after getting his insurance paperwork sorted out he has been taking it at full dose.  He is tolerating it well.  There is no sunburn or any other side effect.  The main issue is that he does have nausea but no vomiting.  The nausea is mild.  He has no complaints overall he feels stable.  His last lab work was last year.  His last pulmonary function test was 2 years ago last CT scan of the chest was 1 year ago.  He does not smoke.  He denies any new health issues no changes in medications.  He is on disability for his hip and his knee does bother him.  However he does think he can walk a 6-minute walk test.   His symptoms: Walking desaturation appears stable over time.SABRA  His weight is also stable.  He does have OSA but unable to tolerate     OV 05/06/2022  Subjective:  Patient ID: Erik Strong, male , DOB: 1961-08-10 , age 28 y.o. , MRN: [id number] , ADDRESS: [address] [place] [address] PCP Merilee, L.Addie, MD Patient Care Team: Merilee, LITTIE.Addie, MD as PCP - General (Family Medicine)  This Provider for this visit: Treatment Team:  Attending Provider: Geronimo Amel, MD   05/06/2022 -   Chief Complaint  Patient presents with    Follow-up    PFT performed today.  Pt states he has been doing okay since last visit. States his breathing has been doing okay.     HPI DENNIS HEGEMAN 63 y.o. -returns for follow-up.  At this point in time he is stable on pirfenidone .  His symptoms are minimal.  He does say that when he goes out and son there is some burning but there is no rash.  He has noticed that niacin in the past.  He was wondering if it was because of pirfenidone .  I confirmed the same.  But he does use sunscreen and this brings down the bone intensity and definitely no skin rash at any time or erythema.  He has no new complaints.  He had pulmonary function test shows slight reduction that is probably not clinically significant.  He states that he ran out of Symbicort  due to insurance issues few months ago and just ordered it yesterday.  He suspects is because of that.  His symptom score is actually better.  He had high-resolution CT chest in summer 2023 and is stable.  Overall he believes he is stable.   We discussed clinical trials as a care option and coverered the following  CT Chest data  OV 07/05/2022  Subjective:  Patient ID: Erik Strong, male , DOB: [birth year] , age 43 y.o. , MRN: [id number] , ADDRESS: [address] [place] [address] PCP Merilee, L.Addie, MD Patient Care Team: Merilee, LITTIE.Addie, MD as PCP - General (Family Medicine)  This Provider for this visit: Treatment Team:  Attending Provider: [place]   [address]/17/2023 -   Chief Complaint  Patient presents with   Follow-up    Cough a little worse. Fever 1 week ago.  SOB about the same.  Would like flu vaccine today     HPI Erik Strong 63 y.o. -returns for routine follow-up.  He states he is doing stable.  Infection from  scores are stable.  A few weeks ago he had temperature of 100.5 COVID test was negative.  He did have some slight increased cough but then this settled.  He is back to baseline.  He is tolerating  pirfenidone  well except for 1 or 2 minutes of dizziness 30 minutes after taking the pirfenidone  it is extremely mild.  His smoking is in remission.  He has not done cocaine in 20 years.  He still smokes marijuana.  He says there is no craving for it he does smoke for fun.  Last use was few days ago.  We spoke about the hazards of smoking marijuana.  He is interested in clinical trials and this will exclude patients who smoke marijuana.  Regardless I have told him about the need to quit marijuana to protect his lung health.  He understands this.  He wants to have his COVID mRNA booster.  He will have it on his own.  This week and is going on a charge trip to [facility] .  He will have his RSV and flu shot today.  We discussed this.   For his emphysema: His son Symbicort  and continues this  He has slightly unexplained elevated CK.  Last check in August 2023 this was coming down.  We will check this again. OV 10/18/2022  Subjective:  Patient ID: Erik Strong, male , DOB: 18-Mar-1961 , age 51 y.o. , MRN: [id number] , ADDRESS: [address] [place] [address] PCP Merilee, L.Addie, MD Patient Care Team: Merilee, LITTIE.Addie, MD as PCP - General (Family Medicine) Hobart Powell BRAVO, MD as PCP - Cardiology (Cardiology)  This Provider for this visit: Treatment Team:  Attending Provider: Geronimo Amel, MD  10/18/2022 -   Chief Complaint  Patient presents with   Follow-up    PFT done today. Breathing is unchanged since his last visit. He has occ cough with exertion. He uses albuterol  3-4 x per wk.      HPI Erik Strong 63 y.o. -returns for follow-up.  Last seen in the fall 2023.  He states that his IPF is stable dyspnea stable.  Tolerating pirfenidone  well.  99% compliant.  No new medical problems.  His CK was elevated and his statin got changed.  His most recent CK in October 2023 was normal.  In the interim no urgent care visits no emergency room visits no surgeries no  admissions.  Symptom scores below.  Weight is below.  They are all stable.  He still continues to smoke marijuana on and off.  He is asking for a letter to avoid jury duty.  I support him in this but he does not have the summons with him.  He is interested in clinical research.  We discussed the possibility of participating in [employer] sponsored bone scans DEXA study.  The study involves getting blood for calcium  and liver function test and also spine x-rays.  He believes he might have osteopenia.  Last bone scan was 10 years ago.  He does not have implants no cancers.  No steroid use.  Have asked the research coordinator to prescreen him.  He is reading the consent form and I gave him the consent form.  The study might be closed for accrual/enrollment.  But we will check with the sponsor. -> lter prescreen faile dbecuase of hardware from # femur in past      OV 08/24/2023  Subjective:  Patient ID: Erik Strong, male , DOB: July 19, 1961 , age 72  y.o. , MRN: [id number] , ADDRESS: [address] [place] [address] PCP Leonel Cole, MD Patient Care Team: Leonel Cole, MD as PCP - General (Family Medicine) Hobart Powell BRAVO, MD (Inactive) as PCP - Cardiology (Cardiology)  This Provider for this visit: Treatment Team:  Attending Provider: Geronimo Amel, MD  08/24/2023 -   Chief Complaint  Patient presents with   Follow-up    Pft f/u       HPI Erik Strong 63 y.o. -presents for IPF follow-up.  Last seen earlier this year.  In the interim the only medication change is that he is no longer taking long-acting Adderall but otherwise taking regular Adderall. Interim Health status: No new complaints No new medical problems. No new surgeries. No ER visits. No Urgent care visits. No changes to medications.  He continues to live with his girlfriend and her son.  That is social support.  He continues to smoke a little marijuana here and there.  In terms of his pulmonary fibrosis he is  stable.  In terms of his pirfenidone  intake not a problem except for some very mild nausea for 5 or 10 minutes after taking pirfenidone .  He feels stable.  He had pulmonary function test and continued stability since he started his Esbriet /pirfenidone .  Last set of liver function normal in June 2024.  He needs another repeat 1 today.  He did discuss about lung transplant as a care option.  Went to the details of transplant.  Explained to him the financial package he needs.  The social support he needs him he also needs to be free of marijuana smoking.  He needs to lose a lot of weight.  At this point in time he is not interested in a referral but just kind of wanted to know his options.  It appears he may not be interested in transplant.  Nevertheless he is too early in the course of his disease.      04/11/2024- Interim hx  Discussed the use of AI scribe software for clinical note transcription with the patient, who gave verbal consent to proceed.  History of Present Illness   Mohammed Mcandrew is a 63 year old male with idiopathic pulmonary fibrosis who presents for follow-up to review PET scan results.  He presents for follow-up to review PET scan results concerning a new left lower lobe subpleural lung nodule. The nodule, first noted in March 2025, has enlarged to 17 millimeters according to imaging. There is no evidence of metastatic disease.  He has a history of idiopathic pulmonary fibrosis with concerns about progression noted on a CT scan from April 2025. He is currently on Esbriet  (pirfenidone ) for IPF, which he restarted after a temporary discontinuation due to diarrhea. Diarrhea has improved as long as he avoids beef. He was previously instructed to restart Esbriet  with a gradual increase in dosage: one pill once daily for one week, then one pill twice daily for one week, and finally one pill three times daily, taken with food.  He has a family history of lung cancer, as his mother had  lung cancer.   OV 02/01/2024  Subjective:  Patient ID: Erik Strong, male , DOB: Feb 12, 1961 , age 67 y.o. , MRN: [id number] , ADDRESS: [address] [place] [address] PCP Leonel Cole, MD Patient Care Team: Leonel Cole, MD as PCP - General (Family Medicine) Kate Lonni CROME, MD as PCP - Cardiology (Cardiology)  This Provider for this visit: Treatment Team:  Attending Provider: Geronimo Amel, MD    02/01/2024 -   Chief Complaint  Patient presents with   Follow-up    Recent CT Chest 12/26/23 showed pulmonary nodule. He has been seeing GI for severe diarrhea. Breathing has been stable.     HPI DILRAJ KILLGORE 63 y.o. -returns for follow-up.  Last seen in December 2024.  He feels his breathing is overall stable.  He is 99% or more compliant with his Esbriet  and he believes he does not have any side effects from this.  However he tells me that for the last 6 weeks or so has had significant diarrhea especially for the last 3 to 4 weeks.  He ended up in [facility].  Some of the chart reviewed.  He has seen gastroenterology.  There is concerned that because of his gardening and tick bites he is having some meat allergy  and he subjected himself to food allergy  testing.  Further GI workup is in progress.  I did ask him if he was aware that the drug Pirfenidone  (Esbriet ) can occasionally cause diarrhea although more commonly nausea and vomiting and low appetite.  He was surprised by this.  He feels that the drug is not the cause of his diarrhea because his significant other is also having diarrhea at home and therefore they think there is an external source for this.  Nevertheless I counseled him about the possibility of diarrhea from the drugs that I am prescribing pirfenidone .  He has agreed for a temporary holiday on this.  Reviewed recent labs.  Of note all this ER visits have also resulted in the diagnosis of new left lower lobe nodule.  Radiology report in April 2025 shows a  greater than 1 cm left lower lobe subpleural nodule and they have recommended a 1 year follow-up.  I disagree with this.  Visualization of the images with him show that the onset of this nodule is in March 2025.  It is present in April 2025.  With his marijuana smoking age and fibrosis history this significant pretest probably for malignancy.  I will follow the original recommendation from March 2025 to do a short-term follow-up which would be to get a PET scan to at the end of June 2025.  He is naturally nervous about this.  But he is in alignment with the plan.  His symptom scores are below and overall symptoms are stable.       CT Chest data from date: 12/26/23  - personally visualized and independently interpreted : yes - my findings are: NEW ONSET MARCH 2025. Absent before that and present APRIL 2025 Narrative & Impression  CLINICAL DATA:  Follow-up pulmonary density from prior CT 12/08/2023   EXAM: CT CHEST WITHOUT CONTRAST   TECHNIQUE: Multidetector CT imaging of the chest was performed following the standard protocol without IV contrast.   RADIATION DOSE REDUCTION: This exam was performed according to the departmental dose-optimization program which includes automated exposure control, adjustment of the mA and/or kV according to patient size and/or use of iterative reconstruction technique.   COMPARISON:  CT angio chest 12/08/2023   FINDINGS: Cardiovascular: No significant vascular findings. Normal heart size. No pericardial effusion. No significant coronary artery calcifications. No pericardial effusion   Mediastinum/Nodes: Choose 1. No significant change compared with prior examination small precarinal adenopathy measuring 8 mm in maximum short axis. Aortic pulmonic window adenopathy measuring 5 mm.   Lungs/Pleura: Lungs are clear. No pleural effusion or pneumothorax. There is marked prominence of  the interstitial markings with thickening of the interlobar and  interlobular septal with centrilobular and paraseptal emphysema consistent with chronic interstitial lung disease COPD and extensive bilateral basilar subpleural cystic bullous changes consistent with advanced chronic interstitial lung disease COPD and. Comparison with prior examination there is stable 7 mm right lower lobe nodule.   Stable left lower lobe posterior costophrenic sulcus 11 by 7 mm nodular density which could correlate with nodular atelectasis. Lung-RADS 2, benign appearance or behavior. Continue annual screening with low-dose chest CT without contrast in 12 months.   Upper Abdomen: No acute abnormality.   Musculoskeletal: No chest wall mass or suspicious bone lesions identified.   IMPRESSION: *Stable 7 mm right lower lobe nodule. *Stable 11 x 7 mm left lower lobe posterior costophrenic sulcus nodular density which could correlate with nodular atelectasis. *Lung-RADS 2, benign appearance or behavior. Continue annual screening with low-dose chest CT without contrast in 12 months. *Advanced chronic interstitial lung disease COPD. *Stable small mediastinal adenopathy.     Electronically Signed   By: Franky Chard M.D    OV 05/09/2024  Subjective:  Patient ID: Erik Strong, male , DOB: 23-Jan-1961 , age 88 y.o. , MRN: [id number] , ADDRESS: [address] [place] [address] PCP Leonel Cole, MD Patient Care Team: Leonel Cole, MD as PCP - General (Family Medicine) Kate Lonni CROME, MD as PCP - Cardiology (Cardiology)  This Provider for this visit: Treatment Team:  Attending Provider: Geronimo Amel, MD     Follow-up idiopathic pulmonary fibrosis `- Diagnoses given 11/13/2020 [myositis panel negative but CK and aldolase slightly elevated]  -Clinical diagnosis based on male gender, classic UIP and serology only being trace positive although age only 53 and prior smoking history  - Last PFT February 2021 -Last high-resolution CT January 2022  -> June 2023 is stable -Started pirfenidone  end of March 2022   Associated emphysema alpha-1 MM  -On Symbicort   Associated significant attention deficit disorder  OSA unable to tolerate CPAP  Normal cardiac stress test March 2022. Last echo April 2022  Current active marijuana smoker -mild, occ but active as of 08/24/2023  New onset diarrhea spring 2025     05/09/2024 -  No chief complaint on file.    HPI HANAN MOEN 63 y.o. -      SYMPTOM SCALE - ILD 11/12/2020  02/08/2021 Now on esbriet  225# 01/25/2022 231#.  Pirfenidone  present 05/06/2022 232#  = esbriet  07/05/2022  10/18/2022 229#e esbriet  08/24/2023 esbriet  02/01/2024 esbret  O2 use ra ts ra ra ra ra ra ra  Shortness of Breath 0 -> 5 scale with 5 being worst (score 6 If unable to do)         At rest 1 0/5 1 0 0 0 0 0  Simple tasks - showers, clothes change, eating, shaving 2 1 2  0.5 1 1 1 1   Household (dishes, doing bed, laundry) 2 2 3  0/5 2 1 3 2   Shopping 3 3 2 1 2 2 2 3   Walking level at own pace 1 2 2  1.5 1 2 2 2   Walking up Stairs 3 3 3  2.5 3 3 3 3   Total (30-36) Dyspnea Score 12 11.5 13 6 9 9 11 11   How bad is your cough? 2 1.5 2 1 1 1 1 2   How bad is your fatigue 2 3 3 2 2 3 3 3   How bad is nausea 0 0.5 2 1 1 2  0 0  How bad is  vomiting?  0 0 0 0 0 0 1 0  How bad is diarrhea? 0 0 0 0 0 0 2 3  How bad is anxiety? 2 1 2 2 1 2 2 2   How bad is depression 1 0 0 0 1 1 0 2  6    Simple office walk 185 feet x  3 laps goal with forehead probe 11/12/2020  /yf  01/25/2022   O2 used ra ra ra  Number laps completed 3 3 3   Comments about pace Slow pace Mod pae   Resting Pulse Ox/HR 96% and 71/min 100% and 73 100% ad HR 59  Final Pulse Ox/HR 95% and 85/min 97% and 93/,o 99% and HR 76  Desaturated </= 88% no no   Desaturated <= 3% points no Yes, 3   Got Tachycardic >/= 90/min no Myes   Symptoms at end of test Some dsypnea last lap Mild-mod dyspnea   Miscellaneous comments x x stable         SIT STAND  TEST - goal 15 times   02/01/2024    O2 used ra   PRobe - finter or forehead finge   Number sit and stand completed - goal 15 15   Time taken to complete 50 sec   Resting Pulse Ox/HR/Dyspnea  96% and 62/min and dyspnea of 1/10    Peak measures 96 % and 64/min and dyspnea of 4/10   Final Pulse Ox/HR 97% and 69/min and dyspnea of 3/10   Desaturated </= 88% no   Desaturated <= 3% points no   Got Tachycardic >/= 90/min no   Miscellaneous comments x    CT Chest data from date: ****  - personally visualized and independently interpreted : *** - my findings are: ***   PFT     Latest Ref Rng & Units 08/24/2023    2:25 PM 10/18/2022    9:06 AM 05/06/2022    1:59 PM 10/21/2019    1:53 PM  PFT Results  FVC-Pre L 4.36  4.41  4.41  4.59   FVC-Predicted Pre % 82  83  81  84   FVC-Post L    4.72   FVC-Predicted Post %    86   Pre FEV1/FVC % % 80  81  79  82   Post FEV1/FCV % %    79   FEV1-Pre L 3.51  3.56  3.50  3.76   FEV1-Predicted Pre % 88  88  85  90   FEV1-Post L    3.75   DLCO uncorrected ml/min/mmHg 22.58  21.52  20.68  27.70   DLCO UNC% % 75  71  66  88   DLCO corrected ml/min/mmHg 22.58  21.52  20.68  27.11   DLCO COR %Predicted % 75  71  66  86   DLVA Predicted % 83  75  83  91   TLC L    7.98   TLC % Predicted %    103   RV % Predicted %    140        LAB RESULTS last 96 hours No results found.       has a past medical history of ADD (attention deficit disorder), Anxiety, Arthritis, Chronic pain, Diarrhea, History of kidney stones, and Temporal head injury.   reports that he quit smoking about 6 years ago. His smoking use included cigarettes. He started smoking about 46 years ago. He has a 84 pack-year smoking history. He  has never used smokeless tobacco.  Past Surgical History:  Procedure Laterality Date   HIP SURGERY     I & D EXTREMITY Left 09/02/2019   Procedure: IRRIGATION AND DEBRIDEMENT HAND;  Surgeon: Sebastian Lenis, MD;  Location: [facility] OR;  Service:  Orthopedics;  Laterality: Left;   TOOTH EXTRACTION Bilateral 12/30/2016   Procedure: EXTRACTION MOLARS;  Surgeon: Glendia Primrose, DDS;  Location: [facility] OR;  Service: Oral Surgery;  Laterality: Bilateral;  Extraction of teeth four, five, twenty-six, thirty; removal of bilateral mandibular lingual tori    Allergies  Allergen Reactions   Other Other (See Comments)   Codeine Nausea And Vomiting   Macrodantin [Nitrofurantoin Macrocrystal] Rash   Shellfish Allergy  Nausea And Vomiting    Immunization History  Administered Date(s) Administered   Fluzone Influenza virus vaccine,trivalent (IIV3), split virus 05/10/2018, 05/04/2019, 09/08/2020   Influenza, Seasonal, Injecte, Preservative Fre 06/01/2023   Influenza,inj,Quad PF,6+ Mos 09/09/2016, 06/15/2017, 05/10/2018, 05/04/2019, 07/05/2022   PFIZER(Purple Top)SARS-COV-2 Vaccination 12/16/2019, 01/08/2020, 09/08/2020   PNEUMOCOCCAL CONJUGATE-20 11/22/2021   Respiratory Syncytial Virus Vaccine ,Recomb Aduvanted(Arexvy ) 07/05/2022   Td 07/10/2006   Tdap 05/14/2012, 12/03/2014   Zoster Recombinant(Shingrix) 05/08/2019   Zoster, Live 05/08/2019, 05/20/2020    Family History  Problem Relation Age of Onset   Prostate cancer Father    CAD Maternal Uncle      Current Outpatient Medications:    acetaminophen  (TYLENOL ) 325 MG tablet, Take 2 tablets (650 mg total) by mouth every 6 (six) hours., Disp: , Rfl:    albuterol  (VENTOLIN  HFA) 108 (90 Base) MCG/ACT inhaler, TAKE 2 PUFFS BY MOUTH EVERY 6 HOURS AS NEEDED FOR WHEEZE OR SHORTNESS OF BREATH, Disp: 6.7 each, Rfl: 3   albuterol  (VENTOLIN  HFA) 108 (90 Base) MCG/ACT inhaler, Inhale into the lungs every 6 (six) hours as needed for wheezing or shortness of breath., Disp: , Rfl:    ALPRAZolam  (XANAX ) 0.5 MG tablet, Take 0.5 mg by mouth at bedtime., Disp: , Rfl:    amphetamine -dextroamphetamine  (ADDERALL) 15 MG tablet, Take 30 mg by mouth daily., Disp: , Rfl:    atorvastatin  (LIPITOR) 20 MG tablet, TAKE 1  TABLET BY MOUTH EVERY DAY, Disp: 90 tablet, Rfl: 3   budesonide -glycopyrrolate-formoterol  (BREZTRI  AEROSPHERE) 160-9-4.8 MCG/ACT AERO inhaler, Inhale 2 puffs into the lungs in the morning and at bedtime., Disp: , Rfl:    dicyclomine  (BENTYL ) 20 MG tablet, Take 1 tablet (20 mg total) by mouth 2 (two) times daily. (Patient not taking: Reported on 04/11/2024), Disp: 20 tablet, Rfl: 0   ezetimibe  (ZETIA ) 10 MG tablet, TAKE 1 TABLET BY MOUTH EVERY DAY, Disp: 90 tablet, Rfl: 2   hydrocortisone (ANUSOL-HC) 2.5 % rectal cream, Place 1 Application rectally as needed., Disp: , Rfl:    levothyroxine  (SYNTHROID ) 50 MCG tablet, Take 50 mcg by mouth daily before breakfast., Disp: , Rfl:    phenylephrine -shark liver oil-mineral oil-petrolatum (PREPARATION H) 0.25-3-14-71.9 % rectal ointment, Place 1 application rectally daily as needed for hemorrhoids., Disp: , Rfl:    Pirfenidone  (ESBRIET ) 801 MG TABS, Take 1 tablet (801 mg) by mouth in the morning, at noon, and at bedtime., Disp: 270 tablet, Rfl: 1   sildenafil  (REVATIO ) 20 MG tablet, Take 20 mg by mouth daily as needed (FOR ED). , Disp: , Rfl:    SYMBICORT  160-4.5 MCG/ACT inhaler, INHALE 2 PUFFS INTO THE LUNGS TWICE A DAY, Disp: 10.2 each, Rfl: 6   tiZANidine  (ZANAFLEX ) 2 MG tablet, Take 2 mg by mouth at bedtime., Disp: , Rfl:    traMADol  (ULTRAM )  50 MG tablet, Take 50 mg by mouth See admin instructions. Take one tablet (50 mg) by mouth daily at bedtime, may also take one tablet (50 mg) twice daily as needed for pain, Disp: , Rfl:       Objective:   There were no vitals filed for this visit.  Estimated body mass index is 29.16 kg/m as calculated from the following:   Height as of 04/11/24: 6' 1 (1.854 m).   Weight as of 04/11/24: 221 lb (100.2 kg).  @WEIGHTCHANGE @  There were no vitals filed for this visit.   Physical Exam   General: No distress. *** O2 at rest: *** Cane present: *** Sitting in wheel chair: *** Frail: *** Obese: *** Neuro:  Alert and Oriented x 3. GCS 15. Speech normal Psych: Pleasant Resp:  Barrel Chest - ***.  Wheeze - ***, Crackles - ***, No overt respiratory distress CVS: Normal heart sounds. Murmurs - *** Ext: Stigmata of Connective Tissue Disease - *** HEENT: Normal upper airway. PEERL +. No post nasal drip        Assessment/     Assessment & Plan    PLAN There are no Patient Instructions on file for this visit.    FOLLOWUP    No follow-ups on file.    SIGNATURE    Dr. Dorethia Cave, M.D., F.C.C.P,  Pulmonary and Critical Care Medicine Staff Physician, [facility] Director - Interstitial Lung Disease  Program  Pulmonary Fibrosis [facility] [place], [place], [address]  Pager: [phone], If no answer or between  15:00h - 7:00h: call [phone] Telephone: [phone]  7:00 PM 05/09/2024   Moderate Complexity MDM OFFICE  2021 E/M guidelines, first released in 2021, with minor revisions added in 2023 and 2024 Must meet the requirements for 2 out of 3 dimensions to qualify.    Number and complexity of problems addressed Amount and/or complexity of data reviewed Risk of complications and/or morbidity  One or more chronic illness with mild exacerbation, OR progression, OR  side effects of treatment  Two or more stable chronic illnesses  One undiagnosed new problem with uncertain prognosis  One acute illness with systemic symptoms   One Acute complicated injury Must meet the requirements for 1 of 3 of the categories)  Category 1: Tests and documents, historian  Any combination of 3 of the following:  Assessment requiring an independent historian  Review of prior external note(s) from each unique source  Review of results of each unique test  Ordering of each unique test    Category 2: Interpretation of tests   Independent interpretation of a test performed by another physician/other qualified health  care professional (not separately reported)  Category 3: Discuss management/tests  Discussion of management or test interpretation with external physician/other qualified health care professional/appropriate source (not separately reported) Moderate risk of morbidity from additional diagnostic testing or treatment Examples only:  Prescription drug management  Decision regarding minor surgery with identfied patient or procedure risk factors  Decision regarding elective major surgery without identified patient or procedure risk factors  Diagnosis or treatment significantly limited by social determinants of health             HIGh Complexity  OFFICE   2021 E/M guidelines, first released in 2021, with minor revisions added in 2023. Must meet the requirements for 2 out of 3 dimensions to qualify.    Number and complexity of problems  addressed Amount and/or complexity of data reviewed Risk of complications and/or morbidity  Severe exacerbation of chronic illness  Acute or chronic illnesses that may pose a threat to life or bodily function, e.g., multiple trauma, acute MI, pulmonary embolus, severe respiratory distress, progressive rheumatoid arthritis, psychiatric illness with potential threat to self or others, peritonitis, acute renal failure, abrupt change in neurological status Must meet the requirements for 2 of 3 of the categories)  Category 1: Tests and documents, historian  Any combination of 3 of the following:  Assessment requiring an independent historian  Review of prior external note(s) from each unique source  Review of results of each unique test  Ordering of each unique test    Category 2: Interpretation of tests    Independent interpretation of a test performed by another physician/other qualified health care professional (not separately reported)  Category 3: Discuss management/tests  Discussion of management or test interpretation with  external physician/other qualified health care professional/appropriate source (not separately reported)  HIGH risk of morbidity from additional diagnostic testing or treatment Examples only:  Drug therapy requiring intensive monitoring for toxicity  Decision for elective major surgery with identified pateint or procedure risk factors  Decision regarding hospitalization or escalation of level of care  Decision for DNR or to de-escalate care   Parenteral controlled  substances            LEGEND - Independent interpretation involves the interpretation of a test for which there is a CPT code, and an interpretation or report is customary. When a review and interpretation of a test is performed and documented by the provider, but not separately reported (billed), then this would represent an independent interpretation. This report does not need to conform to the usual standards of a complete report of the test. This does not include interpretation of tests that do not have formal reports such as a complete blood count with differential and blood cultures. Examples would include reviewing a chest radiograph and documenting in the medical record an interpretation, but not separately reporting (billing) the interpretation of the chest radiograph.   An appropriate source includes professionals who are not health care professionals but may be involved in the management of the patient, such as a Clinical research associate, upper officer, case manager or teacher, and does not include discussion with family or informal caregivers.    - SDOH: SDOH are the conditions in the environments where people are born, live, learn, work, play, worship, and age that affect a wide range of health, functioning, and quality-of-life outcomes and risks. (e.g., housing, food insecurity, transportation, etc.). SDOH-related Z codes ranging from Z55-Z65 are the ICD-10-CM diagnosis codes used to document SDOH data Z55 - Problems  related to education and literacy Z56 - Problems related to employment and unemployment Z57 - Occupational exposure to risk factors Z58 - Problems related to physical environment Z59 - Problems related to housing and economic circumstances [phone] - Problems related to social environment [phone] - Problems related to upbringing [phone] - Other problems related to primary support group, including family circumstances Z36 - Problems related to certain psychosocial circumstances Z65 - Problems related to other psychosocial circumstances

## 2024-05-10 ENCOUNTER — Ambulatory Visit: Admitting: Internal Medicine

## 2024-05-10 ENCOUNTER — Encounter: Payer: Self-pay | Admitting: Internal Medicine

## 2024-05-10 VITALS — BP 128/66 | HR 65 | Ht 73.0 in | Wt 227.0 lb

## 2024-05-10 DIAGNOSIS — Z5181 Encounter for therapeutic drug level monitoring: Secondary | ICD-10-CM

## 2024-05-10 DIAGNOSIS — J84112 Idiopathic pulmonary fibrosis: Secondary | ICD-10-CM

## 2024-05-10 DIAGNOSIS — R918 Other nonspecific abnormal finding of lung field: Secondary | ICD-10-CM

## 2024-05-10 NOTE — Patient Instructions (Addendum)
 Left lower lobe subpleural lung nodule new onset March 2025 greater than 1 cm   - Also present on CT chest April 2025 -12mm  - PET scan July 2025: 17mm and SUV  > 8   - HIhg prob for early stage 1 lung cancer    Plan  - keep appt with Dr Kerrin 05/15/24 - you need a  PFT asap to help surgeon decide on surgery safety for lobectomy in case surgeon decides this route  -  PLEASE do in hospital ASAP ideally before 05/15/24 - need it for surgery evaluation   #Pulmonary fibrosis - IPF  -Concern for progression on CT scan of the chest in April 2025 - On 05/10/2024 - currently stable - LFT normal July 2025   Plan -advse for you to quit MJ to protect your lungs and consider clinical trials - lose weight and get fit - Continue Esbreit  #Diarrhea   - resolved after cutting down on beef    Plan -  - monitor  Marijuana use  - helps nausea but in long term damanging to lungs  Plan - quit MJ use    Followup  - 3 months 30 min visit with DR Geronimo

## 2024-05-13 ENCOUNTER — Ambulatory Visit (HOSPITAL_COMMUNITY)
Admission: RE | Admit: 2024-05-13 | Discharge: 2024-05-13 | Disposition: A | Discharge status: Home / Self Care | Source: Ambulatory Visit | Attending: Internal Medicine | Admitting: Internal Medicine

## 2024-05-13 DIAGNOSIS — J84112 Idiopathic pulmonary fibrosis: Secondary | ICD-10-CM | POA: Insufficient documentation

## 2024-05-13 LAB — PULMONARY FUNCTION TEST
DL/VA % pred: 61 %
DL/VA: 2.54 ml/min/mmHg/L
DLCO unc % pred: 54 %
DLCO unc: 16.31 ml/min/mmHg
FEF 25-75 Pre: 2.97 L/s
FEF2575-%Pred-Pre: 92 %
FEV1-%Pred-Pre: 83 %
FEV1-Pre: 3.31 L
FEV1FVC-%Pred-Pre: 102 %
FEV6-%Pred-Pre: 83 %
FEV6-Pre: 4.2 L
FEV6FVC-%Pred-Pre: 103 %
FVC-%Pred-Pre: 81 %
FVC-Pre: 4.27 L
Pre FEV1/FVC ratio: 78 %
Pre FEV6/FVC Ratio: 98 %
RV % pred: 119 %
RV: 2.9 L
TLC % pred: 93 %
TLC: 7.09 L

## 2024-05-15 ENCOUNTER — Ambulatory Visit: Admitting: Thoracic Surgery (Cardiothoracic Vascular Surgery)

## 2024-05-16 ENCOUNTER — Ambulatory Visit
Attending: Thoracic Surgery (Cardiothoracic Vascular Surgery) | Admitting: Thoracic Surgery (Cardiothoracic Vascular Surgery)

## 2024-05-16 VITALS — BP 148/78 | HR 65 | Resp 20 | Ht 73.0 in | Wt 227.0 lb

## 2024-05-16 DIAGNOSIS — R918 Other nonspecific abnormal finding of lung field: Secondary | ICD-10-CM
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
## 2024-05-17 ENCOUNTER — Encounter: Payer: Self-pay | Admitting: *Deleted

## 2024-05-17 ENCOUNTER — Other Ambulatory Visit: Payer: Self-pay | Admitting: *Deleted

## 2024-05-17 ENCOUNTER — Telehealth: Payer: Self-pay | Admitting: Internal Medicine

## 2024-05-17 DIAGNOSIS — R911 Solitary pulmonary nodule: Secondary | ICD-10-CM

## 2024-05-21 ENCOUNTER — Encounter (HOSPITAL_COMMUNITY): Payer: Self-pay

## 2024-05-21 NOTE — Telephone Encounter (Signed)
 No notes found in encounter - Closing

## 2024-05-21 NOTE — Progress Notes (Signed)
 Surgical Instructions   Your procedure is scheduled on Friday May 24, 2024. Report to [facility] Main Entrance A at 5:30 A.M., then check in with the Admitting office. Any questions or running late day of surgery: call [phone]  Questions prior to your surgery date: call [phone], Monday-Friday, 8am-4pm. If you experience any cold or flu symptoms such as cough, fever, chills, shortness of breath, etc. between now and your scheduled surgery, please notify us  at the above number.     Remember:  Do not eat or drink after midnight the night before your surgery  Take these medicines the morning of surgery with A SIP OF WATER  atorvastatin  (LIPITOR)  ezetimibe  (ZETIA )  levothyroxine  (SYNTHROID )  Pirfenidone  (ESBRIET )  SYMBICORT  160-4.5 MCG/ACT inhaler  tiZANidine  (ZANAFLEX )   May take these medicines IF NEEDED: acetaminophen  (TYLENOL )  albuterol  (VENTOLIN  HFA) 108 (90 Base) MCG/ACT inhaler .in traMADol  (ULTRAM )   One week prior to surgery, STOP taking any Aspirin  (unless otherwise instructed by your surgeon) Aleve, Naproxen, Ibuprofen , Motrin , Advil , Goody's, BC's, all herbal medications, fish oil, and non-prescription vitamins.  WHAT DO I DO ABOUT MY DIABETES MEDICATION?   Do not take oral diabetes medicines (pills) the morning of surgery.  The day of surgery, do not take other diabetes injectables, including Byetta (exenatide), Bydureon (exenatide ER), Victoza (liraglutide), or Trulicity (dulaglutide).  If your CBG is greater than 220 mg/dL, you may take  of your sliding scale (correction) dose of insulin.   HOW TO MANAGE YOUR DIABETES BEFORE AND AFTER SURGERY  Why is it important to control my blood sugar before and after surgery? Improving blood sugar levels before and after surgery helps healing and can limit problems. A way of improving blood sugar control is eating a healthy diet by:  Eating less sugar and carbohydrates  Increasing activity/exercise   Talking with your doctor about reaching your blood sugar goals High blood sugars (greater than 180 mg/dL) can raise your risk of infections and slow your recovery, so you will need to focus on controlling your diabetes during the weeks before surgery. Make sure that the doctor who takes care of your diabetes knows about your planned surgery including the date and location.  How do I manage my blood sugar before surgery? Check your blood sugar at least 4 times a day, starting 2 days before surgery, to make sure that the level is not too high or low.  Check your blood sugar the morning of your surgery when you wake up and every 2 hours until you get to the Short Stay unit.  If your blood sugar is less than 70 mg/dL, you will need to treat for low blood sugar: Do not take insulin. Treat a low blood sugar (less than 70 mg/dL) with  cup of clear juice (cranberry or apple), 4 glucose tablets, OR glucose gel. Recheck blood sugar in 15 minutes after treatment (to make sure it is greater than 70 mg/dL). If your blood sugar is not greater than 70 mg/dL on recheck, call [phone] for further instructions. Report your blood sugar to the short stay nurse when you get to Short Stay.  If you are admitted to the hospital after surgery: Your blood sugar will be checked by the staff and you will probably be given insulin after surgery (instead of oral diabetes medicines) to make sure you have good blood sugar levels. The goal for blood sugar control after surgery is 80-180 mg/dL.  Do NOT Smoke (Tobacco/Vaping) for 24 hours prior to your procedure.  If you use a CPAP at night, you may bring your mask/headgear for your overnight stay.   You will be asked to remove any contacts, glasses, piercing's, hearing aid's, dentures/partials prior to surgery. Please bring cases for these items if needed.    Patients discharged the day of surgery will not be allowed to drive home, and someone  needs to stay with them for 24 hours.  SURGICAL WAITING ROOM VISITATION Patients may have no more than 2 support people in the waiting area - these visitors may rotate.   Pre-op nurse will coordinate an appropriate time for 1 ADULT support person, who may not rotate, to accompany patient in pre-op.  Children under the age of 47 must have an adult with them who is not the patient and must remain in the main waiting area with an adult.  If the patient needs to stay at the hospital during part of their recovery, the visitor guidelines for inpatient rooms apply.  Please refer to the [facility] website for the visitor guidelines for any additional information.   If you received a COVID test during your pre-op visit  it is requested that you wear a mask when out in public, stay away from anyone that may not be feeling well and notify your surgeon if you develop symptoms. If you have been in contact with anyone that has tested positive in the last 10 days please notify you surgeon.      Pre-operative CHG Bathing Instructions   You can play a key role in reducing the risk of infection after surgery. Your skin needs to be as free of germs as possible. You can reduce the number of germs on your skin by washing with CHG (chlorhexidine  gluconate) soap before surgery. CHG is an antiseptic soap that kills germs and continues to kill germs even after washing.   DO NOT use if you have an allergy  to chlorhexidine /CHG or antibacterial soaps. If your skin becomes reddened or irritated, stop using the CHG and notify one of our RNs at [phone].              TAKE A SHOWER THE NIGHT BEFORE SURGERY AND THE DAY OF SURGERY    Please keep in mind the following:  You may shave your face before/day of surgery.  Place clean sheets on your bed the night before surgery Use a clean washcloth (not used since being washed) for each shower. DO NOT sleep with pet's night before surgery.  CHG Shower Instructions:   Wash your face and private area with normal soap. If you choose to wash your hair, wash first with your normal shampoo.  After you use shampoo/soap, rinse your hair and body thoroughly to remove shampoo/soap residue.  Turn the water OFF and apply half the bottle of CHG soap to a CLEAN washcloth.  Apply CHG soap ONLY FROM YOUR NECK DOWN TO YOUR TOES (washing for 3-5 minutes)  DO NOT use CHG soap on face, private areas, open wounds, or sores.  Pay special attention to the area where your surgery is being performed.  If you are having back surgery, having someone wash your back for you may be helpful. Wait 2 minutes after CHG soap is applied, then you may rinse off the CHG soap.  Pat dry with a clean towel  Put on clean pajamas    Additional instructions for the day of surgery: DO NOT APPLY any lotions,  deodorants or cologne   Do not wear jewelry Do not bring valuables to the hospital. [facility] is not responsible for valuables/personal belongings. Put on clean/comfortable clothes.  Please brush your teeth.  Ask your nurse before applying any prescription medications to the skin.

## 2024-05-22 ENCOUNTER — Encounter (HOSPITAL_COMMUNITY)
Admission: RE | Admit: 2024-05-22 | Discharge: 2024-05-22 | Disposition: A | Discharge status: Home / Self Care | Source: Ambulatory Visit | Attending: Thoracic Surgery (Cardiothoracic Vascular Surgery) | Admitting: Thoracic Surgery (Cardiothoracic Vascular Surgery)

## 2024-05-22 ENCOUNTER — Encounter (HOSPITAL_COMMUNITY): Payer: Self-pay

## 2024-05-22 ENCOUNTER — Other Ambulatory Visit: Payer: Self-pay

## 2024-05-22 ENCOUNTER — Ambulatory Visit (HOSPITAL_COMMUNITY)
Admission: RE | Admit: 2024-05-22 | Discharge: 2024-05-22 | Disposition: A | Discharge status: Home / Self Care | Source: Ambulatory Visit | Attending: Thoracic Surgery (Cardiothoracic Vascular Surgery) | Admitting: Thoracic Surgery (Cardiothoracic Vascular Surgery)

## 2024-05-22 VITALS — BP 135/82 | HR 57 | Temp 98.4°F | Resp 18 | Ht 73.0 in | Wt 226.0 lb

## 2024-05-22 DIAGNOSIS — Z01818 Encounter for other preprocedural examination: Secondary | ICD-10-CM | POA: Insufficient documentation

## 2024-05-22 DIAGNOSIS — E119 Type 2 diabetes mellitus without complications: Secondary | ICD-10-CM | POA: Insufficient documentation

## 2024-05-22 DIAGNOSIS — R911 Solitary pulmonary nodule: Secondary | ICD-10-CM | POA: Insufficient documentation

## 2024-05-22 HISTORY — DX: Pulmonary fibrosis, unspecified: J84.10

## 2024-05-22 HISTORY — DX: Interstitial pulmonary disease, unspecified: J84.9

## 2024-05-22 HISTORY — DX: Sleep apnea, unspecified: G47.30

## 2024-05-22 HISTORY — DX: Type 2 diabetes mellitus without complications: E11.9

## 2024-05-22 LAB — CBC
HCT: 46.8 % (ref 39.0–52.0)
Hemoglobin: 15 g/dL (ref 13.0–17.0)
MCH: 31.4 pg (ref 26.0–34.0)
MCHC: 32.1 g/dL (ref 30.0–36.0)
MCV: 97.9 fL (ref 80.0–100.0)
Platelets: 342 K/uL (ref 150–400)
RBC: 4.78 MIL/uL (ref 4.22–5.81)
RDW: 13.4 % (ref 11.5–15.5)
WBC: 7.9 K/uL (ref 4.0–10.5)
nRBC: 0 % (ref 0.0–0.2)

## 2024-05-22 LAB — COMPREHENSIVE METABOLIC PANEL WITH GFR
ALT: 35 U/L (ref 0–44)
AST: 27 U/L (ref 15–41)
Albumin: 3.6 g/dL (ref 3.5–5.0)
Alkaline Phosphatase: 59 U/L (ref 38–126)
Anion gap: 8 (ref 5–15)
BUN: 12 mg/dL (ref 8–23)
CO2: 27 mmol/L (ref 22–32)
Calcium: 9.2 mg/dL (ref 8.9–10.3)
Chloride: 103 mmol/L (ref 98–111)
Creatinine, Ser: 1.2 mg/dL (ref 0.61–1.24)
GFR, Estimated: >60 mL/min (ref >60)
Glucose, Bld: 103 mg/dL — ABNORMAL HIGH (ref 70–99)
Potassium: 4.1 mmol/L (ref 3.5–5.1)
Sodium: 138 mmol/L (ref 135–145)
Total Bilirubin: 0.7 mg/dL (ref 0.0–1.2)
Total Protein: 6.9 g/dL (ref 6.5–8.1)

## 2024-05-22 LAB — TYPE AND SCREEN
ABO/RH(D): AB POS
Antibody Screen: NEGATIVE

## 2024-05-22 LAB — APTT: aPTT: 30 s (ref 24–36)

## 2024-05-22 LAB — SURGICAL PCR SCREEN
MRSA, PCR: NEGATIVE
Staphylococcus aureus: NEGATIVE

## 2024-05-22 LAB — HEMOGLOBIN A1C
Hgb A1c MFr Bld: 5.4 % (ref 4.8–5.6)
Mean Plasma Glucose: 108.28 mg/dL

## 2024-05-22 LAB — URINALYSIS, ROUTINE W REFLEX MICROSCOPIC
Bilirubin Urine: NEGATIVE
Glucose, UA: NEGATIVE mg/dL
Hgb urine dipstick: NEGATIVE
Ketones, ur: NEGATIVE mg/dL
Leukocytes,Ua: NEGATIVE
Nitrite: NEGATIVE
Protein, ur: NEGATIVE mg/dL
Specific Gravity, Urine: 1.017 (ref 1.005–1.030)
pH: 6 (ref 5.0–8.0)

## 2024-05-22 LAB — GLUCOSE, CAPILLARY: Glucose-Capillary: 102 mg/dL — ABNORMAL HIGH (ref 70–99)

## 2024-05-22 LAB — PROTIME-INR
INR: 0.9 (ref 0.8–1.2)
Prothrombin Time: 12.7 s (ref 11.4–15.2)

## 2024-05-22 NOTE — Progress Notes (Signed)
 PCP - Dr. Cheryle Frees Cardiologist - Dr. Lonni Nanas, LOV 09/14/2023 Pulmonologist: Dr. Dorethia Cave  PPM/ICD - denies Device Orders - na Rep Notified - na  Chest x-ray - PAT, 05/22/2024 EKG - PAT, 05/22/2024 Stress Test - 12/16/2023 ECHO -  10/26/2023 Cardiac Cath -  PFT: 05/13/2024  Sleep Study - Diagnosed with sleep apnea, cannot tolerate CPAP CPAP - cannot tolerate   Type II diabetic. A1C 5.9 on 12/06/2023; sample drawn at PAT appointment. Blood sugar 102 at PAT appointment. Does not take any medications for diabetes and does not check his blood sugar Fasting Blood Sugar. na  Checks Blood Sugar na  Blood Thinner Instructions: denies Aspirin  Instructions:denies  ERAS Protcol - NPO  Anesthesia review: Yes. Pulmonary fibrosis, ILD, DM, rales in bilateral bases, sleep apnea  Patient denies shortness of breath, fever, cough and chest pain at PAT appointment   All instructions explained to the patient, with a verbal understanding of the material. Patient agrees to go over the instructions while at home for a better understanding. Patient also instructed to self quarantine after being tested for COVID-19. The opportunity to ask questions was provided.

## 2024-05-23 NOTE — Progress Notes (Signed)
 Anesthesia Chart Review:  63 year old male with pertinent history including former smoker (84 pack years, quit 2018), emphysema (on Symbicort ), pulmonary fibrosis (on pirfenidone ), ILD, ADD, CKD 3, alpha gal syndrome, hypothyroidism, non-insulin-dependent DM2, OSA intolerant to CPAP.  He was recently been followed for pulmonary nodule.  PET/CT 03/2024 showed 11 x 17 mm nodule with SUV of 8.1 concerning for primary bronchogenic carcinoma.  He was seen by CT surgery and recommended to proceed with robotic left lower lobe wedge resection.  Recent echo 10/26/2023 showed LVEF 55 to 60%, normal RV, no significant valvular abnormalities.  Event monitor 09/2023 showed predominant rhythm to be sinus, 3 episodes of NSVT, longest lasting 5 beats, 22 episodes of SVT, longest lasting 10 beats.  Nuclear stress test 12/15/2020 was low risk.  Preop labs reviewed, WNL.  EKG 05/22/2024: Sinus bradycardia with 1st degree A-V block.  Rate 51.  CHEST - 2 VIEW 05/22/2024:   COMPARISON:  Radiograph 12/08/2023, CT 12/26/2023   FINDINGS: Emphysema with chronic bronchial thickening. The left lower lobe pulmonary nodule is potentially visualized on the lateral view. No acute airspace disease, pleural effusion, or pneumothorax. Normal heart size with stable mediastinal contours. No acute osseous findings.   IMPRESSION: 1. Emphysema with chronic bronchial thickening. 2. Left lower lobe pulmonary nodule is potentially visualized on the lateral view.  TTE 10/26/2023: 1. Left ventricular ejection fraction, by estimation, is 55 to 60%. Left  ventricular ejection fraction by 3D volume is 56 %. The left ventricle has  normal function. The left ventricle has no regional wall motion  abnormalities. Left ventricular diastolic   parameters were normal. The average left ventricular global longitudinal  strain is -19.7 %. The global longitudinal strain is normal.   2. Right ventricular systolic function is normal. The right  ventricular  size is normal. Tricuspid regurgitation signal is inadequate for assessing  PA pressure.   3. The mitral valve is normal in structure. Trivial mitral valve  regurgitation. No evidence of mitral stenosis.   4. The aortic valve was not well visualized. Aortic valve regurgitation  is not visualized. No aortic stenosis is present.   5. Aortic dilatation noted. Aneurysm of the aortic root, measuring 45 mm.  There is dilatation of the ascending aorta, measuring 40 mm.   6. The inferior vena cava is normal in size with greater than 50%  respiratory variability, suggesting right atrial pressure of 3 mmHg.   Nuclear stress test 12/15/2020: Nuclear stress EF: 66%. The left ventricular ejection fraction is hyperdynamic (>65%). There was no ST segment deviation noted during stress. The study is normal. This is a low risk study.   Low risk stress nuclear study with normal perfusion and normal left ventricular regional and global systolic function.    Lynwood Geofm RIGGERS [facility]/Anesthesiology Phone [phone] 05/23/2024 9:22 AM

## 2024-05-23 NOTE — Anesthesia Preprocedure Evaluation (Addendum)
 Anesthesia Evaluation  Patient identified by MRN, date of birth, ID band Patient awake    Reviewed: Allergy  & Precautions, NPO status , Patient's Chart, lab work & pertinent test results  History of Anesthesia Complications Negative for: history of anesthetic complications  Airway Mallampati: II  TM Distance: >3 FB Neck ROM: Full    Dental  (+) Dental Advisory Given, Poor Dentition, Missing, Chipped   Pulmonary sleep apnea , COPD,  COPD inhaler, former smoker  Pulmonary fibrosis, ILD Possible bronchogenic carcinoma    Pulmonary exam normal        Cardiovascular negative cardio ROS Normal cardiovascular exam     Neuro/Psych  PSYCHIATRIC DISORDERS Anxiety      Memory trouble d/t injury as a teenager     GI/Hepatic negative GI ROS, Neg liver ROS,,,  Endo/Other  diabetesHypothyroidism    Renal/GU negative Renal ROS     Musculoskeletal  (+) Arthritis ,    Abdominal   Peds  (+) ATTENTION DEFICIT DISORDER WITHOUT HYPERACTIVITY Hematology negative hematology ROS (+)   Anesthesia Other Findings Chronic pain   Reproductive/Obstetrics                              Anesthesia Physical Anesthesia Plan  ASA: 3  Anesthesia Plan: General   Post-op Pain Management: Tylenol  PO (pre-op)*   Induction: Intravenous  PONV Risk Score and Plan: 2 and Treatment may vary due to age or medical condition, Ondansetron , Dexamethasone  and Midazolam   Airway Management Planned: Double Lumen EBT  Additional Equipment: Arterial line  Intra-op Plan:   Post-operative Plan: Possible Post-op intubation/ventilation  Informed Consent: I have reviewed the patients History and Physical, chart, labs and discussed the procedure including the risks, benefits and alternatives for the proposed anesthesia with the patient or authorized representative who has indicated his/her understanding and acceptance.      Dental advisory given  Plan Discussed with: CRNA and Anesthesiologist  Anesthesia Plan Comments: (PAT note by Lynwood Hope, PA-C 2 large bore PIV Vivasight DLEBT)         Anesthesia Quick Evaluation

## 2024-05-24 ENCOUNTER — Inpatient Hospital Stay (HOSPITAL_COMMUNITY): Admitting: Anesthesiology

## 2024-05-24 ENCOUNTER — Inpatient Hospital Stay (HOSPITAL_COMMUNITY)
Admission: RE | Admit: 2024-05-24 | Discharge: 2024-05-27 | DRG: 164 | Disposition: A | Discharge status: Home / Self Care | Attending: Thoracic Surgery (Cardiothoracic Vascular Surgery) | Admitting: Thoracic Surgery (Cardiothoracic Vascular Surgery)

## 2024-05-24 ENCOUNTER — Inpatient Hospital Stay (HOSPITAL_COMMUNITY): Admitting: Physician Assistant

## 2024-05-24 ENCOUNTER — Encounter (HOSPITAL_COMMUNITY)
Admission: RE | Disposition: A | Discharge status: Home / Self Care | Payer: Self-pay | Source: Home / Self Care | Attending: Thoracic Surgery (Cardiothoracic Vascular Surgery)

## 2024-05-24 ENCOUNTER — Other Ambulatory Visit: Payer: Self-pay

## 2024-05-24 ENCOUNTER — Encounter (HOSPITAL_COMMUNITY): Payer: Self-pay | Admitting: Thoracic Surgery (Cardiothoracic Vascular Surgery)

## 2024-05-24 ENCOUNTER — Inpatient Hospital Stay (HOSPITAL_COMMUNITY)

## 2024-05-24 DIAGNOSIS — N1831 Chronic kidney disease, stage 3a: Secondary | ICD-10-CM | POA: Diagnosis present

## 2024-05-24 DIAGNOSIS — Z8042 Family history of malignant neoplasm of prostate: Secondary | ICD-10-CM | POA: Diagnosis not present

## 2024-05-24 DIAGNOSIS — E039 Hypothyroidism, unspecified: Secondary | ICD-10-CM | POA: Diagnosis present

## 2024-05-24 DIAGNOSIS — Z888 Allergy status to other drugs, medicaments and biological substances status: Secondary | ICD-10-CM

## 2024-05-24 DIAGNOSIS — E119 Type 2 diabetes mellitus without complications: Secondary | ICD-10-CM

## 2024-05-24 DIAGNOSIS — Z9889 Other specified postprocedural states: Principal | ICD-10-CM

## 2024-05-24 DIAGNOSIS — D72829 Elevated white blood cell count, unspecified: Secondary | ICD-10-CM | POA: Diagnosis present

## 2024-05-24 DIAGNOSIS — F419 Anxiety disorder, unspecified: Secondary | ICD-10-CM | POA: Diagnosis present

## 2024-05-24 DIAGNOSIS — Z8249 Family history of ischemic heart disease and other diseases of the circulatory system: Secondary | ICD-10-CM | POA: Diagnosis not present

## 2024-05-24 DIAGNOSIS — Z87891 Personal history of nicotine dependence: Secondary | ICD-10-CM | POA: Diagnosis not present

## 2024-05-24 DIAGNOSIS — Z885 Allergy status to narcotic agent status: Secondary | ICD-10-CM | POA: Diagnosis not present

## 2024-05-24 DIAGNOSIS — C3432 Malignant neoplasm of lower lobe, left bronchus or lung: Secondary | ICD-10-CM

## 2024-05-24 DIAGNOSIS — R042 Hemoptysis: Secondary | ICD-10-CM | POA: Diagnosis present

## 2024-05-24 DIAGNOSIS — Z7951 Long term (current) use of inhaled steroids: Secondary | ICD-10-CM

## 2024-05-24 DIAGNOSIS — G4730 Sleep apnea, unspecified: Secondary | ICD-10-CM | POA: Diagnosis present

## 2024-05-24 DIAGNOSIS — J449 Chronic obstructive pulmonary disease, unspecified: Secondary | ICD-10-CM

## 2024-05-24 DIAGNOSIS — E1122 Type 2 diabetes mellitus with diabetic chronic kidney disease: Secondary | ICD-10-CM | POA: Diagnosis present

## 2024-05-24 DIAGNOSIS — Z79899 Other long term (current) drug therapy: Secondary | ICD-10-CM | POA: Diagnosis not present

## 2024-05-24 DIAGNOSIS — I440 Atrioventricular block, first degree: Secondary | ICD-10-CM | POA: Diagnosis present

## 2024-05-24 DIAGNOSIS — Z79890 Hormone replacement therapy (postmenopausal): Secondary | ICD-10-CM

## 2024-05-24 DIAGNOSIS — J439 Emphysema, unspecified: Secondary | ICD-10-CM | POA: Diagnosis present

## 2024-05-24 DIAGNOSIS — C3492 Malignant neoplasm of unspecified part of left bronchus or lung: Secondary | ICD-10-CM

## 2024-05-24 DIAGNOSIS — Z881 Allergy status to other antibiotic agents status: Secondary | ICD-10-CM

## 2024-05-24 DIAGNOSIS — Z91013 Allergy to seafood: Secondary | ICD-10-CM

## 2024-05-24 DIAGNOSIS — Z902 Acquired absence of lung [part of]: Secondary | ICD-10-CM

## 2024-05-24 DIAGNOSIS — R911 Solitary pulmonary nodule: Secondary | ICD-10-CM | POA: Diagnosis present

## 2024-05-24 HISTORY — PX: LYMPH NODE BIOPSY: SHX201

## 2024-05-24 HISTORY — PX: WEDGE RESECTION, LUNG, ROBOT-ASSISTED, THORACOSCOPIC: SHX7655

## 2024-05-24 HISTORY — PX: INTERCOSTAL NERVE BLOCK: SHX5021

## 2024-05-24 LAB — POCT I-STAT 7, (LYTES, BLD GAS, ICA,H+H)
Acid-base deficit: 3 mmol/L — ABNORMAL HIGH (ref 0.0–2.0)
Acid-base deficit: 4 mmol/L — ABNORMAL HIGH (ref 0.0–2.0)
Acid-base deficit: 3 mmol/L — ABNORMAL HIGH (ref 0.0–2.0)
Bicarbonate: 25.6 mmol/L (ref 20.0–28.0)
Bicarbonate: 26.7 mmol/L (ref 20.0–28.0)
Bicarbonate: 25.2 mmol/L (ref 20.0–28.0)
Calcium, Ion: 1.21 mmol/L (ref 1.15–1.40)
Calcium, Ion: 1.22 mmol/L (ref 1.15–1.40)
Calcium, Ion: 1.15 mmol/L (ref 1.15–1.40)
HCT: 43 % (ref 39.0–52.0)
HCT: 44 % (ref 39.0–52.0)
HCT: 40 % (ref 39.0–52.0)
Hemoglobin: 14.6 g/dL (ref 13.0–17.0)
Hemoglobin: 15 g/dL (ref 13.0–17.0)
Hemoglobin: 13.6 g/dL (ref 13.0–17.0)
O2 Saturation: 88 %
O2 Saturation: 92 %
O2 Saturation: 100 %
Potassium: 4.6 mmol/L (ref 3.5–5.1)
Potassium: 4.9 mmol/L (ref 3.5–5.1)
Potassium: 5 mmol/L (ref 3.5–5.1)
Sodium: 138 mmol/L (ref 135–145)
Sodium: 139 mmol/L (ref 135–145)
Sodium: 138 mmol/L (ref 135–145)
TCO2: 28 mmol/L (ref 22–32)
TCO2: 29 mmol/L (ref 22–32)
TCO2: 27 mmol/L (ref 22–32)
pCO2 arterial: 65.1 mmHg (ref 32–48)
pCO2 arterial: 67.4 mmHg (ref 32–48)
pCO2 arterial: 58.4 mmHg — ABNORMAL HIGH (ref 32–48)
pH, Arterial: 7.202 — ABNORMAL LOW (ref 7.35–7.45)
pH, Arterial: 7.206 — ABNORMAL LOW (ref 7.35–7.45)
pH, Arterial: 7.244 — ABNORMAL LOW (ref 7.35–7.45)
pO2, Arterial: 67 mmHg — ABNORMAL LOW (ref 83–108)
pO2, Arterial: 80 mmHg — ABNORMAL LOW (ref 83–108)
pO2, Arterial: 436 mmHg — ABNORMAL HIGH (ref 83–108)

## 2024-05-24 LAB — GLUCOSE, CAPILLARY
Glucose-Capillary: 111 mg/dL — ABNORMAL HIGH (ref 70–99)
Glucose-Capillary: 194 mg/dL — ABNORMAL HIGH (ref 70–99)

## 2024-05-24 LAB — ABO/RH: ABO/RH(D): AB POS

## 2024-05-24 SURGERY — WEDGE RESECTION, LUNG, ROBOT-ASSISTED, THORACOSCOPIC
Anesthesia: General | Site: Chest | Laterality: Left

## 2024-05-24 MED ORDER — FENTANYL CITRATE (PF) 100 MCG/2ML IJ SOLN
INTRAMUSCULAR | Status: AC
Start: 2024-05-24 — End: 2024-05-24
  Filled 2024-05-24: qty 2

## 2024-05-24 MED ORDER — AMPHETAMINE-DEXTROAMPHETAMINE 5 MG PO TABS: 15.0000 mg | ORAL_TABLET | Freq: Every evening | ORAL | Status: DC

## 2024-05-24 MED ORDER — GABAPENTIN 250 MG/5ML PO SOLN: 300.0000 mg | Freq: Two times a day (BID) | ORAL | Status: DC

## 2024-05-24 MED ORDER — ROCURONIUM BROMIDE 10 MG/ML (PF) SYRINGE
PREFILLED_SYRINGE | INTRAVENOUS | Status: AC
Start: 2024-05-24 — End: 2024-05-24
  Filled 2024-05-24: qty 10

## 2024-05-24 MED ORDER — DEXAMETHASONE SODIUM PHOSPHATE 10 MG/ML IJ SOLN
INTRAMUSCULAR | Status: AC
  Filled 2024-05-24: qty 1

## 2024-05-24 MED ORDER — GABAPENTIN 300 MG PO CAPS: 300.0000 mg | ORAL_CAPSULE | Freq: Two times a day (BID) | ORAL | Status: DC

## 2024-05-24 MED ORDER — SENNOSIDES-DOCUSATE SODIUM 8.6-50 MG PO TABS
1.0000 | ORAL_TABLET | Freq: Every day | ORAL | Status: DC
  Administered 2024-05-24 – 2024-05-25 (×2): 1 via ORAL
  Filled 2024-05-24 (×3): qty 1

## 2024-05-24 MED ORDER — CEFAZOLIN SODIUM-DEXTROSE 2-4 GM/100ML-% IV SOLN
2.0000 g | Freq: Three times a day (TID) | INTRAVENOUS | Status: AC
  Administered 2024-05-24 (×2): 2 g via INTRAVENOUS
  Filled 2024-05-24 (×2): qty 100

## 2024-05-24 MED ORDER — LUNG SURGERY BOOK
Freq: Once | Status: AC
  Filled 2024-05-24: qty 1

## 2024-05-24 MED ORDER — DEXAMETHASONE SODIUM PHOSPHATE 10 MG/ML IJ SOLN
INTRAMUSCULAR | Status: DC | PRN
  Administered 2024-05-24: 10 mg via INTRAVENOUS

## 2024-05-24 MED ORDER — 0.9 % SODIUM CHLORIDE (POUR BTL) OPTIME
TOPICAL | Status: DC | PRN
  Administered 2024-05-24: 2000 mL

## 2024-05-24 MED ORDER — OXYCODONE HCL 5 MG PO TABS
5.0000 mg | ORAL_TABLET | ORAL | Status: DC | PRN
  Administered 2024-05-24: 5 mg via ORAL
  Administered 2024-05-24 – 2024-05-27 (×7): 10 mg via ORAL
  Filled 2024-05-24 (×8): qty 2

## 2024-05-24 MED ORDER — HEMOSTATIC AGENTS (NO CHARGE) OPTIME: TOPICAL | Status: DC | PRN

## 2024-05-24 MED ORDER — GABAPENTIN 250 MG/5ML PO SOLN
300.0000 mg | Freq: Every day | ORAL | Status: AC
  Administered 2024-05-24 – 2024-05-26 (×3): 300 mg via ORAL
  Filled 2024-05-24 (×3): qty 6

## 2024-05-24 MED ORDER — ACETAMINOPHEN 500 MG PO TABS
1000.0000 mg | ORAL_TABLET | Freq: Four times a day (QID) | ORAL | Status: DC
  Administered 2024-05-24 – 2024-05-27 (×11): 1000 mg via ORAL
  Filled 2024-05-24 (×11): qty 2

## 2024-05-24 MED ORDER — LACTATED RINGERS IV SOLN: INTRAVENOUS | Status: DC | PRN

## 2024-05-24 MED ORDER — ONDANSETRON HCL 4 MG/2ML IJ SOLN
INTRAMUSCULAR | Status: DC | PRN
  Administered 2024-05-24: 4 mg via INTRAVENOUS

## 2024-05-24 MED ORDER — CEFAZOLIN SODIUM-DEXTROSE 2-4 GM/100ML-% IV SOLN
2.0000 g | INTRAVENOUS | Status: AC
  Administered 2024-05-24: 2 g via INTRAVENOUS

## 2024-05-24 MED ORDER — LACTATED RINGERS IV SOLN: INTRAVENOUS | Status: DC

## 2024-05-24 MED ORDER — ONDANSETRON HCL 4 MG/2ML IJ SOLN
INTRAMUSCULAR | Status: AC
  Filled 2024-05-24: qty 2

## 2024-05-24 MED ORDER — ROCURONIUM BROMIDE 10 MG/ML (PF) SYRINGE
PREFILLED_SYRINGE | INTRAVENOUS | Status: AC
  Filled 2024-05-24: qty 10

## 2024-05-24 MED ORDER — PROPOFOL 10 MG/ML IV BOLUS
INTRAVENOUS | Status: AC
  Filled 2024-05-24: qty 20

## 2024-05-24 MED ORDER — FENTANYL CITRATE (PF) 250 MCG/5ML IJ SOLN
INTRAMUSCULAR | Status: DC | PRN
  Administered 2024-05-24: 50 ug via INTRAVENOUS
  Administered 2024-05-24: 100 ug via INTRAVENOUS
  Administered 2024-05-24 (×2): 50 ug via INTRAVENOUS

## 2024-05-24 MED ORDER — TRAMADOL HCL 50 MG PO TABS
50.0000 mg | ORAL_TABLET | Freq: Four times a day (QID) | ORAL | Status: DC | PRN
  Administered 2024-05-25: 50 mg via ORAL
  Filled 2024-05-24: qty 1

## 2024-05-24 MED ORDER — LEVOTHYROXINE SODIUM 50 MCG PO TABS
50.0000 ug | ORAL_TABLET | Freq: Every day | ORAL | Status: DC
  Administered 2024-05-25 – 2024-05-27 (×3): 50 ug via ORAL
  Filled 2024-05-24 (×3): qty 1

## 2024-05-24 MED ORDER — FENTANYL CITRATE PF 50 MCG/ML IJ SOSY: 25.0000 ug | PREFILLED_SYRINGE | INTRAMUSCULAR | Status: DC | PRN

## 2024-05-24 MED ORDER — BUPIVACAINE HCL (PF) 0.5 % IJ SOLN
INTRAMUSCULAR | Status: AC
  Filled 2024-05-24: qty 30

## 2024-05-24 MED ORDER — ALBUMIN HUMAN 5 % IV SOLN: INTRAVENOUS | Status: DC | PRN

## 2024-05-24 MED ORDER — MIDAZOLAM HCL 2 MG/2ML IJ SOLN
INTRAMUSCULAR | Status: DC | PRN
  Administered 2024-05-24: 2 mg via INTRAVENOUS

## 2024-05-24 MED ORDER — ACETAMINOPHEN 500 MG PO TABS
1000.0000 mg | ORAL_TABLET | Freq: Once | ORAL | Status: AC
  Administered 2024-05-24: 1000 mg via ORAL
  Filled 2024-05-24: qty 2

## 2024-05-24 MED ORDER — OXYCODONE HCL 5 MG/5ML PO SOLN: 5.0000 mg | Freq: Once | ORAL | Status: DC | PRN

## 2024-05-24 MED ORDER — ALBUTEROL SULFATE HFA 108 (90 BASE) MCG/ACT IN AERS
INHALATION_SPRAY | RESPIRATORY_TRACT | Status: DC | PRN
Start: 2024-05-24 — End: 2024-05-24
  Administered 2024-05-24: 4 via RESPIRATORY_TRACT

## 2024-05-24 MED ORDER — ORAL CARE MOUTH RINSE: 15.0000 mL | Freq: Once | OROMUCOSAL | Status: AC

## 2024-05-24 MED ORDER — AMISULPRIDE (ANTIEMETIC) 5 MG/2ML IV SOLN: 10.0000 mg | Freq: Once | INTRAVENOUS | Status: DC | PRN

## 2024-05-24 MED ORDER — FONDAPARINUX SODIUM 2.5 MG/0.5ML ~~LOC~~ SOLN
2.5000 mg | SUBCUTANEOUS | Status: DC
  Administered 2024-05-24 – 2024-05-26 (×3): 2.5 mg via SUBCUTANEOUS
  Filled 2024-05-24 (×4): qty 0.5

## 2024-05-24 MED ORDER — ONDANSETRON HCL 4 MG/2ML IJ SOLN: 4.0000 mg | Freq: Four times a day (QID) | INTRAMUSCULAR | Status: DC | PRN

## 2024-05-24 MED ORDER — SODIUM CHLORIDE 0.45 % IV SOLN: INTRAVENOUS | Status: DC

## 2024-05-24 MED ORDER — FENTANYL CITRATE (PF) 100 MCG/2ML IJ SOLN
25.0000 ug | INTRAMUSCULAR | Status: DC | PRN
  Administered 2024-05-24 (×2): 25 ug via INTRAVENOUS
  Administered 2024-05-24: 50 ug via INTRAVENOUS

## 2024-05-24 MED ORDER — SODIUM CHLORIDE 0.9 % IV SOLN
INTRAVENOUS | Status: AC | PRN
  Administered 2024-05-24: 1000 mL

## 2024-05-24 MED ORDER — BUPIVACAINE LIPOSOME 1.3 % IJ SUSP
INTRAMUSCULAR | Status: AC
  Filled 2024-05-24: qty 20

## 2024-05-24 MED ORDER — SODIUM CHLORIDE (PF) 0.9 % IJ SOLN
INTRAMUSCULAR | Status: AC
  Filled 2024-05-24: qty 50

## 2024-05-24 MED ORDER — FENTANYL CITRATE (PF) 250 MCG/5ML IJ SOLN
INTRAMUSCULAR | Status: AC
  Filled 2024-05-24: qty 5

## 2024-05-24 MED ORDER — OXYCODONE HCL 5 MG PO TABS: 5.0000 mg | ORAL_TABLET | Freq: Once | ORAL | Status: DC | PRN

## 2024-05-24 MED ORDER — PHENYLEPHRINE 80 MCG/ML (10ML) SYRINGE FOR IV PUSH (FOR BLOOD PRESSURE SUPPORT)
PREFILLED_SYRINGE | INTRAVENOUS | Status: DC | PRN
  Administered 2024-05-24: 80 ug via INTRAVENOUS

## 2024-05-24 MED ORDER — PROPOFOL 10 MG/ML IV BOLUS
INTRAVENOUS | Status: AC
Start: 2024-05-24 — End: 2024-05-24
  Filled 2024-05-24: qty 20

## 2024-05-24 MED ORDER — FLUTICASONE FUROATE-VILANTEROL 200-25 MCG/ACT IN AEPB
1.0000 | INHALATION_SPRAY | Freq: Every day | RESPIRATORY_TRACT | Status: DC
  Administered 2024-05-25 – 2024-05-27 (×3): 1 via RESPIRATORY_TRACT
  Filled 2024-05-24: qty 28

## 2024-05-24 MED ORDER — GABAPENTIN 300 MG PO CAPS: 300.0000 mg | ORAL_CAPSULE | Freq: Every day | ORAL | Status: DC

## 2024-05-24 MED ORDER — PROPOFOL 10 MG/ML IV BOLUS
INTRAVENOUS | Status: DC | PRN
  Administered 2024-05-24: 50 mg via INTRAVENOUS
  Administered 2024-05-24: 150 mg via INTRAVENOUS

## 2024-05-24 MED ORDER — AMPHETAMINE-DEXTROAMPHETAMINE 15 MG PO TABS: 30.0000 mg | ORAL_TABLET | Freq: Every day | ORAL | Status: DC

## 2024-05-24 MED ORDER — BISACODYL 5 MG PO TBEC
10.0000 mg | DELAYED_RELEASE_TABLET | Freq: Every day | ORAL | Status: DC
  Administered 2024-05-24 – 2024-05-27 (×4): 10 mg via ORAL
  Filled 2024-05-24 (×4): qty 2

## 2024-05-24 MED ORDER — TIZANIDINE HCL 2 MG PO TABS: 2.0000 mg | ORAL_TABLET | Freq: Three times a day (TID) | ORAL | Status: DC | PRN

## 2024-05-24 MED ORDER — ACETAMINOPHEN 160 MG/5ML PO SOLN: 1000.0000 mg | Freq: Four times a day (QID) | ORAL | Status: DC

## 2024-05-24 MED ORDER — LIDOCAINE 2% (20 MG/ML) 5 ML SYRINGE
INTRAMUSCULAR | Status: DC | PRN
  Administered 2024-05-24: 60 mg via INTRAVENOUS

## 2024-05-24 MED ORDER — ALBUTEROL SULFATE (2.5 MG/3ML) 0.083% IN NEBU: 3.0000 mL | INHALATION_SOLUTION | Freq: Four times a day (QID) | RESPIRATORY_TRACT | Status: DC | PRN

## 2024-05-24 MED ORDER — EZETIMIBE 10 MG PO TABS
10.0000 mg | ORAL_TABLET | Freq: Every day | ORAL | Status: DC
  Administered 2024-05-25 – 2024-05-27 (×3): 10 mg via ORAL
  Filled 2024-05-24 (×3): qty 1

## 2024-05-24 MED ORDER — MIDAZOLAM HCL 2 MG/2ML IJ SOLN
INTRAMUSCULAR | Status: AC
  Filled 2024-05-24: qty 2

## 2024-05-24 MED ORDER — SUGAMMADEX SODIUM 200 MG/2ML IV SOLN
INTRAVENOUS | Status: DC | PRN
  Administered 2024-05-24: 400 mg via INTRAVENOUS

## 2024-05-24 MED ORDER — SODIUM CHLORIDE FLUSH 0.9 % IV SOLN
INTRAVENOUS | Status: DC | PRN
  Administered 2024-05-24: 100 mL

## 2024-05-24 MED ORDER — ALPRAZOLAM 0.5 MG PO TABS
0.5000 mg | ORAL_TABLET | Freq: Every day | ORAL | Status: DC
  Administered 2024-05-24 – 2024-05-26 (×3): 0.5 mg via ORAL
  Filled 2024-05-24 (×3): qty 1

## 2024-05-24 MED ORDER — PANTOPRAZOLE SODIUM 40 MG PO TBEC
40.0000 mg | DELAYED_RELEASE_TABLET | Freq: Every day | ORAL | Status: DC
  Administered 2024-05-25 – 2024-05-27 (×3): 40 mg via ORAL
  Filled 2024-05-24 (×3): qty 1

## 2024-05-24 MED ORDER — CHLORHEXIDINE GLUCONATE 0.12 % MT SOLN
15.0000 mL | Freq: Once | OROMUCOSAL | Status: AC
  Administered 2024-05-24: 15 mL via OROMUCOSAL

## 2024-05-24 MED ORDER — SODIUM CHLORIDE 0.9 % IV SOLN: 12.5000 mg | INTRAVENOUS | Status: DC | PRN

## 2024-05-24 MED ORDER — LIDOCAINE 2% (20 MG/ML) 5 ML SYRINGE
INTRAMUSCULAR | Status: AC
  Filled 2024-05-24: qty 5

## 2024-05-24 MED ORDER — PIRFENIDONE 801 MG PO TABS
801.0000 mg | ORAL_TABLET | Freq: Three times a day (TID) | ORAL | Status: DC
  Administered 2024-05-25 – 2024-05-27 (×7): 801 mg via ORAL
  Filled 2024-05-24 (×11): qty 1

## 2024-05-24 MED ORDER — ROCURONIUM BROMIDE 10 MG/ML (PF) SYRINGE
PREFILLED_SYRINGE | INTRAVENOUS | Status: DC | PRN
  Administered 2024-05-24: 60 mg via INTRAVENOUS
  Administered 2024-05-24: 40 mg via INTRAVENOUS
  Administered 2024-05-24: 30 mg via INTRAVENOUS

## 2024-05-24 MED ORDER — ATORVASTATIN CALCIUM 10 MG PO TABS
20.0000 mg | ORAL_TABLET | Freq: Every day | ORAL | Status: DC
  Administered 2024-05-25: 20 mg via ORAL
  Filled 2024-05-24: qty 2

## 2024-05-24 MED ORDER — AMPHETAMINE-DEXTROAMPHETAMINE 20 MG PO TABS: 30.0000 mg | ORAL_TABLET | Freq: Every day | ORAL | Status: DC

## 2024-05-24 SURGICAL SUPPLY — 72 items
BLADE CLIPPER SURG (BLADE) ×2 IMPLANT
CANISTER SUCTION 3000ML PPV (SUCTIONS) ×4 IMPLANT
CANNULA REDUCER 12-8 DVNC XI (CANNULA) ×4 IMPLANT
CLIP APPLIE ROT 10 11.4 M/L (STAPLE) IMPLANT
CLIP LIGATING HEM O LOK PURPLE (MISCELLANEOUS) IMPLANT
CLIP TI WIDE RED SMALL 6 (CLIP) IMPLANT
CNTNR URN SCR LID CUP LEK RST (MISCELLANEOUS) ×10 IMPLANT
CONN ST 1/4X3/8 BEN (MISCELLANEOUS) IMPLANT
DEFOGGER SCOPE WARM SEASHARP (MISCELLANEOUS) ×2 IMPLANT
DERMABOND ADVANCED .7 DNX12 (GAUZE/BANDAGES/DRESSINGS) ×2 IMPLANT
DRAIN CHANNEL 28F RND 3/8 FF (WOUND CARE) IMPLANT
DRAPE ARM DVNC X/XI (DISPOSABLE) ×8 IMPLANT
DRAPE COLUMN DVNC XI (DISPOSABLE) ×2 IMPLANT
DRAPE CV SPLIT W-CLR ANES SCRN (DRAPES) ×2 IMPLANT
DRAPE HALF SHEET 40X57 (DRAPES) ×2 IMPLANT
DRAPE INCISE IOBAN 66X45 STRL (DRAPES) IMPLANT
DRAPE SURG ORHT 6 SPLT 77X108 (DRAPES) ×2 IMPLANT
ELECT BLADE 6.5 EXT (BLADE) IMPLANT
ELECTRODE REM PT RTRN 9FT ADLT (ELECTROSURGICAL) ×2 IMPLANT
FORCEPS BPLR FENES DVNC XI (FORCEP) IMPLANT
FORCEPS BPLR LNG DVNC XI (INSTRUMENTS) IMPLANT
GAUZE KITTNER 4X5 RF (MISCELLANEOUS) ×4 IMPLANT
GAUZE SPONGE 4X4 12PLY STRL (GAUZE/BANDAGES/DRESSINGS) ×2 IMPLANT
GAUZE SPONGE 4X4 12PLY STRL LF (GAUZE/BANDAGES/DRESSINGS) IMPLANT
GLOVE SS BIOGEL STRL SZ 7.5 (GLOVE) ×2 IMPLANT
GOWN STRL REUS W/ TWL LRG LVL3 (GOWN DISPOSABLE) ×4 IMPLANT
GOWN STRL REUS W/ TWL XL LVL3 (GOWN DISPOSABLE) ×4 IMPLANT
GOWN STRL REUS W/TWL 2XL LVL3 (GOWN DISPOSABLE) ×2 IMPLANT
GRASPER TIP-UP FEN DVNC XI (INSTRUMENTS) IMPLANT
HEMOSTAT SURGICEL 2X14 (HEMOSTASIS) ×6 IMPLANT
IRRIGATION STRYKERFLOW (MISCELLANEOUS) ×2 IMPLANT
KIT BASIN OR (CUSTOM PROCEDURE TRAY) ×2 IMPLANT
KIT SUCTION CATH 14FR (SUCTIONS) IMPLANT
KIT TURNOVER KIT B (KITS) ×2 IMPLANT
NDL HYPO 25GX1X1/2 BEV (NEEDLE) ×2 IMPLANT
NDL SPNL 22GX3.5 QUINCKE BK (NEEDLE) ×2 IMPLANT
NEEDLE HYPO 25GX1X1/2 BEV (NEEDLE) ×1 IMPLANT
NEEDLE SPNL 22GX3.5 QUINCKE BK (NEEDLE) ×1 IMPLANT
NS IRRIG 1000ML POUR BTL (IV SOLUTION) ×2 IMPLANT
PACK CHEST (CUSTOM PROCEDURE TRAY) ×2 IMPLANT
PAD ARMBOARD POSITIONER FOAM (MISCELLANEOUS) ×4 IMPLANT
PORT ACCESS TROCAR AIRSEAL 12 (TROCAR) ×2 IMPLANT
RELOAD STAPLE 45 4.3 GRN DVNC (STAPLE) IMPLANT
RELOAD STAPLE 45 4.6 BLK DVNC (STAPLE) IMPLANT
RELOAD STAPLE 45 BLK VRY/THCK (STAPLE) IMPLANT
SCISSORS LAP 5X35 DISP (ENDOMECHANICALS) IMPLANT
SEAL UNIV 5-12 XI (MISCELLANEOUS) ×8 IMPLANT
SEALANT PROGEL (MISCELLANEOUS) IMPLANT
SET TRI-LUMEN FLTR TB AIRSEAL (TUBING) ×2 IMPLANT
SOLUTION ELECTROSURG ANTI STCK (MISCELLANEOUS) ×2 IMPLANT
SPONGE TONSIL 1 RF SGL (DISPOSABLE) IMPLANT
STAPLER 45 SUREFORM CVD DVNC (STAPLE) IMPLANT
SUT PDS AB 3-0 SH 27 (SUTURE) IMPLANT
SUT PROLENE 4-0 RB1 .5 CRCL 36 (SUTURE) IMPLANT
SUT SILK 1 MH (SUTURE) ×4 IMPLANT
SUT SILK 2 0 SH (SUTURE) IMPLANT
SUT SILK 2 0SH CR/8 30 (SUTURE) IMPLANT
SUT SILK 3 0SH CR/8 30 (SUTURE) IMPLANT
SUT VIC AB 1 CTX36XBRD ANBCTR (SUTURE) IMPLANT
SUT VIC AB 2-0 CTX 36 (SUTURE) IMPLANT
SUT VIC AB 3-0 X1 27 (SUTURE) ×4 IMPLANT
SUT VICRYL 0 TIES 12 18 (SUTURE) ×2 IMPLANT
SUT VICRYL 0 UR6 27IN ABS (SUTURE) ×4 IMPLANT
SUTURE V-LC BRB 180 2/0GR6GS22 (SUTURE) IMPLANT
SYR 20ML LL LF (SYRINGE) ×4 IMPLANT
SYSTEM RETRIEVAL ANCHOR 8 (MISCELLANEOUS) IMPLANT
SYSTEM SAHARA CHEST DRAIN ATS (WOUND CARE) ×2 IMPLANT
TAPE CLOTH 4X10 WHT NS (GAUZE/BANDAGES/DRESSINGS) ×2 IMPLANT
TIP SPRAY PROGEL 11IN (MISCELLANEOUS) IMPLANT
TOWEL GREEN STERILE (TOWEL DISPOSABLE) ×4 IMPLANT
TRAY FOLEY MTR SLVR 16FR STAT (SET/KITS/TRAYS/PACK) ×2 IMPLANT
WATER STERILE IRR 1000ML POUR (IV SOLUTION) ×2 IMPLANT

## 2024-05-24 NOTE — Anesthesia Procedure Notes (Signed)
 Arterial Line Insertion Start/End9/01/2024 6:50 AM, 05/24/2024 7:00 AM Performed by: Lucious Debby BRAVO, MD, Mannie Krystal LABOR, CRNA, CRNA  Patient location: Pre-op. Preanesthetic checklist: patient identified, IV checked, site marked, risks and benefits discussed, surgical consent, monitors and equipment checked, pre-op evaluation, timeout performed and anesthesia consent Lidocaine  1% used for infiltration Right, radial was placed Catheter size: 20 G Hand hygiene performed  and maximum sterile barriers used  Allen's test indicative of satisfactory collateral circulation Attempts: 2 Procedure performed using ultrasound guided technique. Ultrasound Notes:anatomy identified, needle tip was noted to be adjacent to the nerve/plexus identified and no ultrasound evidence of intravascular and/or intraneural injection Following insertion, dressing applied. Post procedure assessment: normal and unchanged  Patient tolerated the procedure well with no immediate complications. Additional procedure comments: 1 attempt by SRNA, one successful attempt using US  by CRNA.

## 2024-05-24 NOTE — Anesthesia Procedure Notes (Signed)
 Procedure Name: Intubation Date/Time: 05/24/2024 7:48 AM  Performed by: Tommas Cena SQUIBB, RNPre-anesthesia Checklist: Patient identified, Emergency Drugs available, Suction available and Patient being monitored Patient Re-evaluated:Patient Re-evaluated prior to induction Oxygen  Delivery Method: Circle system utilized Preoxygenation: Pre-oxygenation with 100% oxygen  Induction Type: IV induction Ventilation: Two handed mask ventilation required and Oral airway inserted - appropriate to patient size Laryngoscope Size: Mac and 4 Grade View: Grade I Tube type: Oral Endobronchial tube: Left and Double lumen EBT and 41 Fr Number of attempts: 1 Airway Equipment and Method: Stylet and Oral airway Placement Confirmation: ETT inserted through vocal cords under direct vision, positive ETCO2 and breath sounds checked- equal and bilateral (Fiberoptic) Secured at: 31 cm Tube secured with: Tape Dental Injury: Teeth and Oropharynx as per pre-operative assessment  Comments: Preformed by SRNA; MDA and CRNA present for procedure

## 2024-05-24 NOTE — Op Note (Signed)
 NAME: Erik Strong, Erik A. MEDICAL RECORD NO: [id number] ACCOUNT NO: [id number] DATE OF BIRTH: 1960-11-08 FACILITY: [facility] LOCATION: [facility] PHYSICIAN: Elspeth BROCKS. Kerrin, MD  Operative Report   DATE OF PROCEDURE: 05/24/2024  PREOPERATIVE DIAGNOSIS:  Left lower lobe lung nodule.  POSTOPERATIVE DIAGNOSIS:  Squamous cell carcinoma, left lower lobe, clinical stage IA (T1, N0) or IB (T2a, N0).  PROCEDURE:  Xi robotic-assisted left lower lobe wedge resection, lymph node dissection, and intercostal nerve blocks, levels 3-10.  SURGEON:  Elspeth BROCKS.  Kerrin, MD  ASSISTANT:  Rocky Shad, PA.  Experienced assistance was necessary for this case due to surgical complexity.  Erin Barrett assisted with port placement, camera management, robot docking and undocking, instrument exchange, suctioning, specimen retrieval, and wound closure.  ANESTHESIA:  General.  FINDINGS:  Enlarged level 9 node negative for malignancy on frozen section.  Mass clearly visible posterolateral left lower lobe with invagination of the visceral pleura.  Frozen section revealed squamous cell carcinoma.  Margins negative for tumor.  CLINICAL NOTE:  Erik Strong is a 63 year old man with a history of tobacco abuse, interstitial lung disease, and pulmonary fibrosis.  He was found in March to have a new 11 x 14 mm left lower lobe lung nodule.  On PET CT in July, the nodule increased in size to 11 x 17 mm with an SUV of 8.1.  There was no mediastinal or hilar adenopathy.  He was advised to undergo surgical resection for definitive diagnosis and treatment.  Given the relatively peripheral location of the nodule, it was felt that he could be managed with a sublobar resection, likely a wedge resection depending on intraoperative findings.  The indications, risks, benefits, and alternatives were discussed in detail with the patient.  He understood and accepted the risks and agreed to proceed.  OPERATIVE NOTE:  Erik Strong was brought to the operating  room on 05/24/2024.  He had induction of general anesthesia and was intubated with a double-lumen endotracheal tube.  Intravenous antibiotics were administered.  A Foley catheter was placed.  Sequential compression devices were placed on the calves for DVT prophylaxis.  He was placed in a right lateral decubitus position.  A Bair Hugger was placed for active warming.  The left chest was prepped and draped in the usual sterile fashion.  Single-lung ventilation of the right lung was initiated and was tolerated well throughout the procedure.  A timeout was performed.  A solution containing 20 mL of liposomal bupivacaine , 30 mL of 0.5% bupivacaine , and 50 mL of saline was prepared.  This solution was used for local for incisions as well as for the intercostal nerve blocks.  An incision was made in the eighth interspace in the midaxillary line and the chest was bluntly entered using a hemostat.  An 8-mm port was inserted.  The thoracoscope was advanced into the chest.  After confirming intrapleural placement, carbon dioxide was insufflated per protocol.  A 12-mm port was placed in the eighth interspace anterior to the camera port.  Intercostal nerve blocks then were performed from the third to the tenth interspace by injecting 10 mL of bupivacaine  solution into a subpleural plane at each level.  There was good isolation of the left lung.  There was no pleural effusion and no abnormality of the parietal pleura.  A 12-mm AirSeal port was placed in the 10th interspace posterolaterally.  Two additional 8th interspace robotic ports were placed.  The robot was deployed.  The camera arm was docked.  Targeting was performed.  The remaining arms were docked.  The robotic instruments were inserted with thoracoscopic visualization.  The lower lobe was retracted superiorly and the inferior  pulmonary ligament was divided using bipolar cautery.  There was a large level 9 node over 1 cm in size.  It was removed and sent for frozen  section.  That subsequently returned with no tumor seen.  There was scarring and significant anthracosis visible in the lung.  The nodule was apparent on the posterior aspect of the lower lobe.  The lung was retracted posteriorly.  The pleural reflection was divided at the hilum and a level 10 node was removed and then a level 11 node was removed.  The lung then was retracted anteriorly.  The pleural reflection was divided at the hilum posteriorly and an additional level 10 node as well as a level 7 node removed.  There was some bleeding from a bronchial artery branch with removal of the level 7 node.  Pressure was applied and then a clip was placed and there was good hemostasis.  Finally, a level 5 node was removed.  It was similar in appearance to the level 9 node, enlarged, but otherwise benign in appearance.  The wedge resection then was performed with sequential firings of the robotic stapler using a combination of green and black cartridges.  A 2 cm gross margin was maintained on the tumor.  The specimen was placed into an endoscopic retrieval bag, removed and sent for frozen section.  It subsequently returned showing squamous cell carcinoma.  The margin was free of tumor.  The chest was copiously irrigated with saline.  A test inflation to 30 cm of mercury revealed no significant air leak.  The robotic instruments were removed.  The robot was undocked.  A 28-French chest tube was placed through the anterior port incision and directed to the apex.  It was secured with a #1 silk suture.  The remaining incisions were closed in standard fashion.  All sponge, needle, and instrument counts were correct at the end of the procedure.  He was extubated in the operating room and then taken to the post-anesthetic care unit in good condition.   PUS D: 05/24/2024 2:18:35 pm T: 05/24/2024 5:03:00 pm  JOB: [id number]/ [id number]

## 2024-05-24 NOTE — Hospital Course (Addendum)
 History of Present Illness:  Erik Strong is a 63 yo man with history of pulmonary fibrosis, ILD, tobacco abuse, diabetes, stage IIIA CKD, ADD, anxiety, arthritis and chronic pain.  Followed by Dr. Geronimo for ILD. He presented with chest pain and shortness of breath in March 2025.  Ct angio-was performed and showed no evidence of PE.  However, he was noted to have a new 11 x 14 mm left lower lobe nodule.  Had a repeat CT in April which showed no change.  PET CT in July- 11 x 17 mm nodule with SUV of 8.1.  No mediastinal or hilar adenopathy.  He was referred to Dr. Kerrin for surgical evaluation at which time he complained of cough and wheezing.    His level of shortness of breath with exertion- varies.  He is able to walk up a flight of steps but stops at top of stairs.  He denies changes in appetite or weight loss.  Denies headache and visual changes.  He does also have palpitations but no anginal symptoms.  It was felt he should undergo wedge resection of the nodule with lymph node dissection.  The risks and benefits of the procedure were explained to the patient and he was agreeable to proceed.  Hospital Course:  [name] presented to [facility] on 05/24/2024.  He was taken to the operating room and underwent Robotic Assisted Left Video Thoracoscopy with Wedge Resection of the left lower lobe, lymph node dissection, and intercostal nerve block.  He tolerated the procedure without difficulty, was extubated, and taken to the PACU in stable condition. Chest tube was without an air leak and CXR was without a pneumothorax. Chest tube was removed on POD1 without complication. Follow up CXR showed showed a trace left apical pneumothorax that resolved the following day. He required 3L [place] overnight due to sleep apnea for which he does not tolerate CPAP. He was ambulating well on RA. He was tolerating a diet and had a BM on 05/27/24.  The patient has history of 1st degree AV Block and his EKG remained  stable.  His incisions were healing well without sign of infection. He was felt stable for discharge.

## 2024-05-24 NOTE — Brief Op Note (Addendum)
 05/24/2024  10:05 AM  PATIENT:  Erik Strong  63 y.o. 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,N0) or IB (T2a,N0)  PROCEDURE:  Procedure(s) with comments:  ROBOTIC ASSISTED LEFT THORACOSCOPY -Left Lower Lobe Wedge Resection  LYMPH NODE DISSECTION (Left) BLOCK, NERVE, INTERCOSTAL (Left)    SURGEON:  Surgeons and Role:    * Kerrin Elspeth BROCKS, MD - Primary  PHYSICIAN ASSISTANT: Rocky Shad PA-C  ASSISTANTS: None   ANESTHESIA:   general  EBL:  150 mL   BLOOD ADMINISTERED:none  DRAINS: 28 Straight Chest Tube   LOCAL MEDICATIONS USED:  BUPIVICAINE   SPECIMEN:  Source of Specimen:  Left Lower Lobe Wedge Resection, Lymph Nodes  DISPOSITION OF SPECIMEN:  PATHOLOGY  COUNTS:  YES  TOURNIQUET:  * No tourniquets in log *  DICTATION: .Dragon Dictation  PLAN OF CARE: Admit to inpatient   PATIENT DISPOSITION:  PACU - hemodynamically stable.   Delay start of Pharmacological VTE agent (>24hrs) due to surgical blood loss or risk of bleeding: no

## 2024-05-24 NOTE — Anesthesia Postprocedure Evaluation (Signed)
 Anesthesia Post Note  Patient: Erik Strong  Procedure(s) Performed: LOWER LOBE WEDGE RESECTION, LUNG, ROBOT-ASSISTED, LEFT THORACOSCOPIC (Left: Chest) BLOCK, NERVE, INTERCOSTAL (Left: Chest) LYMPH NODE BIOPSY (Left: Chest)     Patient location during evaluation: PACU Anesthesia Type: General Level of consciousness: awake and alert Pain management: pain level controlled Vital Signs Assessment: post-procedure vital signs reviewed and stable Respiratory status: spontaneous breathing, nonlabored ventilation, respiratory function stable and patient connected to nasal cannula oxygen  Cardiovascular status: blood pressure returned to baseline and stable Postop Assessment: no apparent nausea or vomiting Anesthetic complications: no   No notable events documented.  Last Vitals:  Vitals:   05/24/24 1230 05/24/24 1245  BP: 114/64 114/77  Pulse: 60 61  Resp: 12 12  Temp:    SpO2: 96% 93%    Last Pain:  Vitals:   05/24/24 1245  TempSrc:   PainSc: 4                  Debby FORBES Like

## 2024-05-24 NOTE — Discharge Instructions (Signed)
 Discharge Instructions:  1. You may shower, please wash incisions daily with soap and water and keep dry.  If you wish to cover wounds with dressing you may do so but please keep clean and change daily.  No tub baths or swimming until incisions have completely healed.  If your incisions become red or develop any drainage please call our office at [phone]  2. No Driving until cleared by Dr. Chrystal office and you are no longer using narcotic pain medications  3. Fever of 101.5 for at least 24 hours with no source, please contact our office at [phone]  4. Activity- up as tolerated, please walk at least 3 times per day.  Avoid strenuous activity  5. If any questions or concerns arise, please do not hesitate to contact our office at [phone]

## 2024-05-24 NOTE — Discharge Summary (Cosign Needed)
 [address] [place] [address]             [phone]        Physician Discharge Summary  Patient ID: Erik Strong MRN: [id number] DOB/AGE: Aug 11, 1961 63 y.o.  Admit date: 05/24/2024 Discharge date: 05/27/2024  Admission Diagnoses:  Patient Active Problem List   Diagnosis Date Noted   Hyperlipidemia 10/04/2022   OSA on CPAP 04/26/2022   Adrenal mass (HCC) 01/25/2022   Septic arthritis (HCC) 09/02/2019   COPD GOLD 0/AB 05/09/2019   IPF / UIP possible 05/09/2019   Fever 05/10/2014   Pain in left hip 05/10/2014   Attention deficit disorder 05/10/2014   Cigarette smoker 05/10/2014   Rash 05/09/2014   Elevated LFTs 05/09/2014   Chronic pain 05/09/2014   Discharge Diagnoses:   Patient Active Problem List   Diagnosis Date Noted   S/P Robotic Assisted Left Video Thoracoscopy with Wedge Resection of Left Lower Lobe 05/24/2024   Squamous cell carcinoma lung, left (HCC) 05/24/2024   S/P partial lobectomy of lung 05/24/2024   Hyperlipidemia 10/04/2022   OSA on CPAP 04/26/2022   Adrenal mass (HCC) 01/25/2022   Septic arthritis (HCC) 09/02/2019   COPD GOLD 0/AB 05/09/2019   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  Followed by Dr. Geronimo for ILD. He presented with chest pain and shortness of breath in March 2025.  Ct angio-was performed and showed no evidence of PE.  However, he was noted to have a new 11 x 14 mm left lower lobe nodule.  Had a repeat CT in April which showed no change.  PET CT in July- 11 x 17 mm nodule with SUV of 8.1.  No mediastinal or  hilar adenopathy.  He was referred to Dr. Kerrin for surgical evaluation at which time he complained of cough and wheezing.    His level of shortness of breath with exertion- varies.  He is able to walk up a flight of steps but stops at top of stairs.  He denies changes in appetite or weight loss.  Denies headache and visual changes.  He does also have palpitations but no anginal symptoms.  It was felt he should undergo wedge resection of the nodule with lymph node dissection.  The risks and benefits of the procedure were explained to the patient and he was agreeable to proceed.  Hospital Course:  [name] presented to [facility] on 05/24/2024.  He was taken to the operating room and underwent Robotic Assisted Left Video Thoracoscopy with Wedge Resection of the left lower lobe, lymph node dissection, and intercostal nerve block.  He tolerated the procedure without difficulty, was extubated, and taken to the PACU in stable condition. Chest tube was without an air leak and CXR was without a pneumothorax. Chest tube was removed on POD1 without complication. Follow up CXR showed showed a trace left apical pneumothorax that resolved the following day. He required 3L [place] overnight due to sleep apnea for which he does not tolerate CPAP. He was  ambulating well on RA. He was tolerating a diet and had a BM on 05/27/24.  The patient has history of 1st degree AV Block and his EKG remained stable.  His incisions were healing well without sign of infection. He was felt stable for discharge.   Consults: None  Significant Diagnostic Studies: nuclear medicine:   FINDINGS: Mediastinal blood-pool activity (background): SUV max = 2.9   Liver activity (reference): SUV max = N/A   NECK:  No hypermetabolic lymph nodes or masses.   Incidental CT findings:  None.   CHEST: No hypermetabolic lymph nodes. Chronic interstitial fibrosis shows FDG uptake predominantly in the dependent lower lobes.   17 mm subpleural  nodule in the posterior left lower lobe measures 17 mm on image 64/7 compared to 12 mm previously. This nodule shows marked hypermetabolism, with SUV max of 8.1. 7 mm pulmonary nodule in the posterior right lower lobe on image 47/7 remains stable in size, and shows absence of focal FDG uptake above background interstitial fibrosis in the posterior lower lobes.   Incidental CT findings: Chronic pulmonary interstitial fibrosis with bibasilar predominance. Mild biapical emphysema.   ABDOMEN/PELVIS: No abnormal hypermetabolic activity within the liver, pancreas, adrenal glands, or spleen. No hypermetabolic lymph nodes in the abdomen or pelvis.   Incidental CT findings: Chronic left renal parenchymal atrophy and scarring. Left hip internal fixation hardware.   SKELETON: No focal hypermetabolic bone lesions to suggest skeletal metastasis.   Incidental CT findings:  None.   IMPRESSION: Interval enlargement of 17 mm subpleural nodule in posterior left lower lobe, which shows marked hypermetabolism, highly suspicious for primary bronchogenic carcinoma.   Stable 7 mm right lower lobe pulmonary nodule, which shows no FDG uptake. Recommend continued attention on follow-up imaging.   No evidence of metastatic disease.   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    Electronically Signed   By: Norleen DELENA Kil M.D.   On: 03/29/2024 13:44  Treatments: surgery:   NAME: [name], [name] MEDICAL RECORD NO: [id number] ACCOUNT NO: [id number] DATE OF BIRTH: [birth year] FACILITY: [facility] LOCATION: [facility] PHYSICIAN: Elspeth BROCKS. Kerrin, MD   Operative Report    DATE OF PROCEDURE: 05/24/2024   PREOPERATIVE DIAGNOSIS:  Left lower lobe lung nodule.   POSTOPERATIVE DIAGNOSIS:  Squamous cell carcinoma, left lower lobe, clinical stage IA (T1, N0) or IB (T2a, N0).   PROCEDURE:  Xi robotic-assisted left lower lobe wedge resection, lymph node dissection, and intercostal nerve blocks,  levels 3-10.   SURGEON:  Elspeth BROCKS.  Kerrin, MD   ASSISTANT:  Rocky Shad, PA.   Experienced assistance was necessary for this case due to surgical complexity.  Dorris Pierre assisted with port placement, camera management, robot docking and undocking, instrument exchange, suctioning, specimen retrieval, and wound closure.  PATHOLOGY: Pending  Discharge Exam: Blood pressure 131/73, pulse 60, temperature 98.6 F (37 C), temperature source Oral, resp. rate 18, height 6' 1 (1.854 m), weight 100.7 kg, SpO2 92%.  General appearance: alert, cooperative, and no distress Heart: regular rate and rhythm Lungs: clear to auscultation bilaterally Abdomen: soft, non-tender; bowel sounds normal; no masses,  no organomegaly Wound: clean and dry  Discharge disposition: 01-Home or Self Care   Allergies as of 05/27/2024       Reactions   Alpha-gal Diarrhea   Acute/chronic diarrhea started approximately April 2025. Alpha gal allergy  noted May 2025.  On 02/05/2024 Ellouise Console, PA-C noted Alpha gal test shows low level of increased allergen to beef. No allergy  to pork. Recommended  to limit and avoid beef. 04/11/2024 noted  that patient symptoms have improved with avoidance of beef    Atomoxetine Other (See Comments)   Other Other (See Comments)   Codeine Nausea And Vomiting   Macrodantin [nitrofurantoin Macrocrystal] Rash   Shellfish Allergy  Nausea And Vomiting        Medication List     STOP taking these medications    Breztri  Aerosphere 160-9-4.8 MCG/ACT Aero inhaler Generic drug: budesonide -glycopyrrolate-formoterol    dicyclomine  20 MG tablet Commonly known as: BENTYL    traMADol  50 MG tablet Commonly known as: ULTRAM        TAKE these medications    acetaminophen  325 MG tablet Commonly known as: Tylenol  Take 2 tablets (650 mg total) by mouth every 6 (six) hours.   albuterol  108 (90 Base) MCG/ACT inhaler Commonly known as: VENTOLIN  HFA TAKE 2 PUFFS BY MOUTH EVERY 6 HOURS AS  NEEDED FOR WHEEZE OR SHORTNESS OF BREATH   ALPRAZolam  0.5 MG tablet Commonly known as: XANAX  Take 0.5 mg by mouth at bedtime.   amphetamine -dextroamphetamine  15 MG tablet Commonly known as: ADDERALL Take 15-30 mg by mouth See admin instructions. Take 30 mg by mouth in the morning and 15 mg in the afternoon   atorvastatin  20 MG tablet Commonly known as: LIPITOR TAKE 1 TABLET BY MOUTH EVERY DAY   ezetimibe  10 MG tablet Commonly known as: ZETIA  TAKE 1 TABLET BY MOUTH EVERY DAY   gabapentin  300 MG capsule Commonly known as: Neurontin  Take 1 capsule (300 mg total) by mouth 2 (two) times daily.   levothyroxine  50 MCG tablet Commonly known as: SYNTHROID  Take 50 mcg by mouth daily before breakfast.   oxyCODONE  5 MG immediate release tablet Commonly known as: Oxy IR/ROXICODONE  Take 1 tablet (5 mg total) by mouth every 4 (four) hours as needed for moderate pain (pain score 4-6).   Pirfenidone  801 MG Tabs Commonly known as: Esbriet  Take 1 tablet (801 mg) by mouth in the morning, at noon, and at bedtime.   sildenafil  20 MG tablet Commonly known as: REVATIO  Take 20 mg by mouth daily as needed (FOR ED).   Symbicort  160-4.5 MCG/ACT inhaler Generic drug: budesonide -formoterol  INHALE 2 PUFFS INTO THE LUNGS TWICE A DAY   tiZANidine  2 MG tablet Commonly known as: ZANAFLEX  Take 2 mg by mouth every 8 (eight) hours as needed for muscle spasms.        Follow-up Information     Kerrin Elspeth BROCKS, MD Follow up on 06/04/2024.   Specialty: Cardiothoracic Surgery Why: Appointment is at 3:45PM.  Please get CXR 1 hour prior to your appointment on the 2nd floor of our office building Contact information: [address] [place] [address] [phone]                 Signed: Rocky Shad, PA-C  05/27/2024, 10:40 AM

## 2024-05-24 NOTE — Transfer of Care (Signed)
 Immediate Anesthesia Transfer of Care Note  Patient: Erik Strong  Procedure(s) Performed: LOWER LOBE WEDGE RESECTION, LUNG, ROBOT-ASSISTED, LEFT THORACOSCOPIC (Left: Chest) BLOCK, NERVE, INTERCOSTAL (Left: Chest) LYMPH NODE BIOPSY (Left: Chest)  Patient Location: PACU  Anesthesia Type:General  Level of Consciousness: awake, oriented, drowsy, and patient cooperative  Airway & Oxygen  Therapy: Patient Spontanous Breathing and Patient connected to face mask oxygen   Post-op Assessment: Report given to RN, Post -op Vital signs reviewed and stable, and Patient moving all extremities X 4  Post vital signs: Reviewed and stable  Last Vitals:  Vitals Value Taken Time  BP 146/81 05/24/24 10:19  Temp 97.3   Pulse 64 05/24/24 10:23  Resp 10 05/24/24 10:23  SpO2 99 % 05/24/24 10:23  Vitals shown include unfiled device data.  Last Pain:  Vitals:   05/24/24 0628  TempSrc:   PainSc: 0-No pain         Complications: No notable events documented.

## 2024-05-24 NOTE — Interval H&P Note (Signed)
 History and Physical Interval Note:  05/24/2024 7:15 AM  Erik Strong  has presented today for surgery, with the diagnosis of LLL NODULE.  The various methods of treatment have been discussed with the patient and family. After consideration of risks, benefits and other options for treatment, the patient has consented to  Procedure(s) with comments: WEDGE RESECTION, LUNG, ROBOT-ASSISTED, THORACOSCOPIC (Left) - Robotic Left Lower Lobe Wedge Resection as a surgical intervention.  The patient's history has been reviewed, patient examined, no change in status, stable for surgery.  I have reviewed the patient's chart and labs.  Questions were answered to the patient's satisfaction.     [name]

## 2024-05-24 NOTE — Plan of Care (Signed)

## 2024-05-25 ENCOUNTER — Inpatient Hospital Stay (HOSPITAL_COMMUNITY)

## 2024-05-25 LAB — CBC
HCT: 41.3 % (ref 39.0–52.0)
Hemoglobin: 13.6 g/dL (ref 13.0–17.0)
MCH: 31.6 pg (ref 26.0–34.0)
MCHC: 32.9 g/dL (ref 30.0–36.0)
MCV: 96 fL (ref 80.0–100.0)
Platelets: 341 K/uL (ref 150–400)
RBC: 4.3 MIL/uL (ref 4.22–5.81)
RDW: 13.4 % (ref 11.5–15.5)
WBC: 18.4 K/uL — ABNORMAL HIGH (ref 4.0–10.5)
nRBC: 0 % (ref 0.0–0.2)

## 2024-05-25 LAB — BASIC METABOLIC PANEL WITH GFR
Anion gap: 7 (ref 5–15)
BUN: 11 mg/dL (ref 8–23)
CO2: 26 mmol/L (ref 22–32)
Calcium: 8.9 mg/dL (ref 8.9–10.3)
Chloride: 102 mmol/L (ref 98–111)
Creatinine, Ser: 1.24 mg/dL (ref 0.61–1.24)
GFR, Estimated: >60 mL/min (ref >60)
Glucose, Bld: 121 mg/dL — ABNORMAL HIGH (ref 70–99)
Potassium: 4.1 mmol/L (ref 3.5–5.1)
Sodium: 135 mmol/L (ref 135–145)

## 2024-05-25 MED ORDER — ATORVASTATIN CALCIUM 10 MG PO TABS
20.0000 mg | ORAL_TABLET | Freq: Every day | ORAL | Status: DC
  Administered 2024-05-25 – 2024-05-26 (×2): 20 mg via ORAL
  Filled 2024-05-25 (×2): qty 2

## 2024-05-25 MED ORDER — AMPHETAMINE-DEXTROAMPHETAMINE 10 MG PO TABS
30.0000 mg | ORAL_TABLET | Freq: Every day | ORAL | Status: DC
  Administered 2024-05-25 – 2024-05-27 (×3): 30 mg via ORAL
  Filled 2024-05-25 (×3): qty 3

## 2024-05-25 MED ORDER — AMPHETAMINE-DEXTROAMPHETAMINE 5 MG PO TABS
15.0000 mg | ORAL_TABLET | Freq: Every day | ORAL | Status: DC
  Administered 2024-05-25 – 2024-05-26 (×2): 15 mg via ORAL
  Filled 2024-05-25 (×2): qty 1

## 2024-05-25 NOTE — Progress Notes (Addendum)
 [address] [place] [address]             [phone]      1 Day Post-Op Procedure(s) (LRB): LOWER LOBE WEDGE RESECTION, LUNG, ROBOT-ASSISTED, LEFT THORACOSCOPIC (Left) BLOCK, NERVE, INTERCOSTAL (Left) LYMPH NODE BIOPSY (Left) Subjective: Patient reports he feels weird with the tube in place. Does admit to left shoulder blade pain last night and pain around the tube this AM.   Objective: Vital signs in last 24 hours: Temp:  [97.6 F (36.4 C)-98.4 F (36.9 C)] 97.7 F (36.5 C) (09/06 0759) Pulse Rate:  [54-79] 66 (09/06 0800) Cardiac Rhythm: Normal sinus rhythm (09/06 0803) Resp:  [10-19] 18 (09/06 0759) BP: (110-146)/(64-103) 114/65 (09/06 0800) SpO2:  [92 %-99 %] 92 % (09/06 0800) Arterial Line BP: (94-156)/(66-97) 94/81 (09/05 1130)  Hemodynamic parameters for last 24 hours:    Intake/Output from previous day: 09/05 0701 - 09/06 0700 In: 2200 [P.O.:600; I.V.:1000; IV Piggyback:600] Out: 2655 [Urine:2375; Blood:150; Chest Tube:130] Intake/Output this shift: Total I/O In: 120 [P.O.:120] Out: -   General appearance: alert, cooperative, and no distress Neurologic: intact Heart: regular rate and rhythm, S1, S2 normal, no murmur, click, rub or gallop Lungs: slightly diminished bibasilar breath sounds Abdomen: soft, non-tender; bowel sounds normal; no masses,  no organomegaly Extremities: extremities normal, atraumatic, no cyanosis or edema Wound: Clean and dry dressing in place  Lab Results: Recent Labs    05/22/24 1400 05/24/24 0851 05/24/24 1001 05/25/24 0207  WBC 7.9  --   --  18.4*  HGB 15.0   < > 13.6 13.6  HCT 46.8   < > 40.0 41.3  PLT 342  --   --  341   < > = values in this interval not displayed.   BMET:  Recent Labs    05/22/24 1400 05/24/24 0851 05/24/24 1001 05/25/24 0207  NA 138   < > 138 135  K 4.1   < > 5.0 4.1  CL 103  --   --  102  CO2 27  --   --  26  GLUCOSE 103*  --   --  121*  BUN 12  --   --  11   CREATININE 1.20  --   --  1.24  CALCIUM  9.2  --   --  8.9   < > = values in this interval not displayed.    PT/INR:  Recent Labs    05/22/24 1400  LABPROT 12.7  INR 0.9   ABG    Component Value Date/Time   PHART 7.244 (L) 05/24/2024 1001   HCO3 25.2 05/24/2024 1001   TCO2 27 05/24/2024 1001   ACIDBASEDEF 3.0 (H) 05/24/2024 1001   O2SAT 100 05/24/2024 1001   CBG (last 3)  Recent Labs    05/22/24 1308 05/24/24 0604 05/24/24 1020  GLUCAP 102* 111* 194*    Assessment/Plan: S/P Procedure(s) (LRB): LOWER LOBE WEDGE RESECTION, LUNG, ROBOT-ASSISTED, LEFT THORACOSCOPIC (Left) BLOCK, NERVE, INTERCOSTAL (Left) LYMPH NODE BIOPSY (Left)  Neuro: Pain this AM, last dose of pain medication was at 6AM. Receiving Tramadol  before I left the room. Continue current pain regimen, should improve once CT is out.   CV: NSR, HR 60s. 1st degree AV block. BP controlled, BP 114/65 this AM.   Pulm: Saturating well on RA this AM. CXR with no clear pneumothorax, chronic interstitial coarsening and bibasilar atelectasis vs scarring. Chest tube without air leak. CT output 130cc/24hrs.  Chest tube to water seal. Encourage IS and ambulation.   GI: No N/V, tolerating a diet  Endo: Preop A1C 5.4. CBGs 102/111/194. Hx of prediabetes, no home meds. Monitor. Hypothyroid, on levothyroxine .   Renal: Cr 1.24, stable. UO 2375cc/24hrs.   ID: Likely reactive leukocytosis, WBC 18.4. Afebrile. Monitor.   DVT Prophylaxis: Fondaparinux  likely due to alpha gal  Dispo: Can likely d/c chest tube today, hopefully can discharge home tomorrow.    LOS: 1 day    Con GORMAN Bend, pA-C 05/25/2024   Chart reviewed, patient examined, agree with above.  CXR looks good this am. Chest tube is out. He says back pain is gone since tube removed. Continue IS, ambulation. Plan home tomorrow.

## 2024-05-25 NOTE — Plan of Care (Signed)
   Problem: Health Behavior/Discharge Planning: Goal: Ability to manage health-related needs will improve Outcome: Progressing   Problem: Clinical Measurements: Goal: Ability to maintain clinical measurements within normal limits will improve Outcome: Progressing   Problem: Clinical Measurements: Goal: Will remain free from infection Outcome: Progressing

## 2024-05-26 ENCOUNTER — Encounter (HOSPITAL_COMMUNITY): Payer: Self-pay | Admitting: Thoracic Surgery (Cardiothoracic Vascular Surgery)

## 2024-05-26 ENCOUNTER — Inpatient Hospital Stay (HOSPITAL_COMMUNITY)

## 2024-05-26 LAB — COMPREHENSIVE METABOLIC PANEL WITH GFR
ALT: 28 U/L (ref 0–44)
AST: 27 U/L (ref 15–41)
Albumin: 3.3 g/dL — ABNORMAL LOW (ref 3.5–5.0)
Alkaline Phosphatase: 54 U/L (ref 38–126)
Anion gap: 8 (ref 5–15)
BUN: 13 mg/dL (ref 8–23)
CO2: 28 mmol/L (ref 22–32)
Calcium: 8.9 mg/dL (ref 8.9–10.3)
Chloride: 102 mmol/L (ref 98–111)
Creatinine, Ser: 1.19 mg/dL (ref 0.61–1.24)
GFR, Estimated: >60 mL/min (ref >60)
Glucose, Bld: 116 mg/dL — ABNORMAL HIGH (ref 70–99)
Potassium: 4 mmol/L (ref 3.5–5.1)
Sodium: 138 mmol/L (ref 135–145)
Total Bilirubin: 0.6 mg/dL (ref 0.0–1.2)
Total Protein: 6.5 g/dL (ref 6.5–8.1)

## 2024-05-26 LAB — CBC
HCT: 42.8 % (ref 39.0–52.0)
Hemoglobin: 14.1 g/dL (ref 13.0–17.0)
MCH: 31.8 pg (ref 26.0–34.0)
MCHC: 32.9 g/dL (ref 30.0–36.0)
MCV: 96.6 fL (ref 80.0–100.0)
Platelets: 301 K/uL (ref 150–400)
RBC: 4.43 MIL/uL (ref 4.22–5.81)
RDW: 13.5 % (ref 11.5–15.5)
WBC: 13.3 K/uL — ABNORMAL HIGH (ref 4.0–10.5)
nRBC: 0 % (ref 0.0–0.2)

## 2024-05-26 MED ORDER — LACTULOSE 10 GM/15ML PO SOLN
20.0000 g | Freq: Once | ORAL | Status: AC
  Filled 2024-05-26: qty 30

## 2024-05-26 MED ORDER — LACTULOSE 10 GM/15ML PO SOLN
20.0000 g | Freq: Two times a day (BID) | ORAL | Status: DC | PRN
  Administered 2024-05-26 (×2): 20 g via ORAL
  Filled 2024-05-26: qty 30

## 2024-05-26 NOTE — Plan of Care (Signed)

## 2024-05-26 NOTE — Progress Notes (Signed)
 Pt has taken out IV. He is refusing another one since he is leaving tomorrow.

## 2024-05-26 NOTE — Progress Notes (Addendum)
 [address] [place] [address]             [phone]      2 Days Post-Op Procedure(s) (LRB): LOWER LOBE WEDGE RESECTION, LUNG, ROBOT-ASSISTED, LEFT THORACOSCOPIC (Left) BLOCK, NERVE, INTERCOSTAL (Left) LYMPH NODE BIOPSY (Left) Subjective: Patient reports he still has not had a bowel movement since Thursday and he only ambulated once yesterday.  Objective: Vital signs in last 24 hours: Temp:  [97.8 F (36.6 C)-98.2 F (36.8 C)] 98.2 F (36.8 C) (09/07 0750) Pulse Rate:  [61-79] 79 (09/07 0750) Cardiac Rhythm: Normal sinus rhythm (09/07 0319) Resp:  [14-19] 14 (09/07 0750) BP: (119-135)/(66-80) 135/68 (09/07 0750) SpO2:  [92 %-94 %] 94 % (09/07 0750)  Hemodynamic parameters for last 24 hours:    Intake/Output from previous day: 09/06 0701 - 09/07 0700 In: 240 [P.O.:240] Out: 300 [Urine:300] Intake/Output this shift: No intake/output data recorded.  General appearance: alert, cooperative, and no distress Neurologic: intact Heart: regular rate and rhythm, S1, S2 normal, no murmur, click, rub or gallop Lungs: Slightly diminished left basilar breath sounds Abdomen: protuberant abdomen, no distension, +BS, no tenderness Extremities: extremities normal, atraumatic, no cyanosis or edema Wound: Clean and dry dressing in place over old chest tube site, other incisions are clean and dry without sign of infection  Lab Results: Recent Labs    05/25/24 0207 05/26/24 0137  WBC 18.4* 13.3*  HGB 13.6 14.1  HCT 41.3 42.8  PLT 341 301   BMET:  Recent Labs    05/25/24 0207 05/26/24 0137  NA 135 138  K 4.1 4.0  CL 102 102  CO2 26 28  GLUCOSE 121* 116*  BUN 11 13  CREATININE 1.24 1.19  CALCIUM  8.9 8.9    PT/INR: No results for input(s): LABPROT, INR in the last 72 hours. ABG    Component Value Date/Time   PHART 7.244 (L) 05/24/2024 1001   HCO3 25.2 05/24/2024 1001   TCO2 27 05/24/2024 1001   ACIDBASEDEF 3.0 (H) 05/24/2024 1001    O2SAT 100 05/24/2024 1001   CBG (last 3)  Recent Labs    05/24/24 0604 05/24/24 1020  GLUCAP 111* 194*    Assessment/Plan: S/P Procedure(s) (LRB): LOWER LOBE WEDGE RESECTION, LUNG, ROBOT-ASSISTED, LEFT THORACOSCOPIC (Left) BLOCK, NERVE, INTERCOSTAL (Left) LYMPH NODE BIOPSY (Left)  Neuro: Pain overall controlled but he reports it is still difficult to take a deep breath due to pain. Continue current pain regimen  CV: NSR, HR 90s. BP controlled, BP 135/68 this AM.    Pulm: Saturating well on RA this AM, pt has sleep apnea does not use CPAP, was using 3L [place] O2 overnight. CXR with a possible trace left apical pneumothorax looks to be improved from yesterday pending final read as well as chronic interstitial coarsening and bibasilar atelectasis vs scarring. Chest tube removed yesterday. Encourage IS and ambulation.    GI: No N/V, tolerating a diet. Reports he has not had a BM since Thursday and feels constipated. Will give lactulose .    Endo: Preop A1C 5.4. CBGs 102/111/194. Hx of prediabetes, no home meds. Patient to follow up with PCP. Hypothyroid, on levothyroxine .    Renal: Cr 1.19, stable. UO 2375cc/24hrs.    ID: Likely reactive leukocytosis improved, WBC 13.3. Afebrile. Clinically monitor.    DVT Prophylaxis: Fondaparinux  likely due to alpha gal   Dispo: Patient only ambulated once yesterday, he needs to ambulate more. He reports he does not feel  very comfortable going home today since he still cannot take a deep breath and has not had a bowel movement. Plan to d/c later today vs tomorrow.      LOS: 2 days    Con GORMAN Bend, PA-C 05/26/2024   Chart reviewed, patient examined, agree with above.  He looks good. CXR is fine. He is concerned that he has not had BM yet. Will give him some lactulose . He has been ambulating. He wants to wait until tomorrow to go home.

## 2024-05-26 NOTE — Progress Notes (Signed)
 Mobility Specialist Progress Note:    05/26/24 1354  Mobility  Activity Ambulated independently  Level of Assistance Independent  Assistive Device None  Distance Ambulated (ft) 800 ft  Activity Response Tolerated well  Mobility Referral Yes  Mobility visit 1 Mobility  Mobility Specialist Start Time (ACUTE ONLY) 1334  Mobility Specialist Stop Time (ACUTE ONLY) 1352  Mobility Specialist Time Calculation (min) (ACUTE ONLY) 18 min   Pt received in room, eager for mobility session. Ambulated independently in hallway, labored breathing throughout, SpO2 90% on RA throughout session. Pt had two bouts of sharp pain in chest d/t deep inhalations. Hr in 80s throughout. Pt requests pain med, RN notified. All needs met.    Miriah Maruyama Mobility Specialist Please contact via Special educational needs teacher or  Rehab office at [phone]

## 2024-05-27 ENCOUNTER — Encounter (HOSPITAL_COMMUNITY): Payer: Self-pay | Admitting: Thoracic Surgery (Cardiothoracic Vascular Surgery)

## 2024-05-27 ENCOUNTER — Other Ambulatory Visit (HOSPITAL_COMMUNITY): Payer: Self-pay

## 2024-05-27 DIAGNOSIS — Z902 Acquired absence of lung [part of]: Secondary | ICD-10-CM

## 2024-05-27 MED ORDER — OXYCODONE HCL 5 MG PO TABS
5.0000 mg | ORAL_TABLET | ORAL | 0 refills | Status: DC | PRN
  Filled 2024-05-27: qty 30, 5d supply, fill #0

## 2024-05-27 MED ORDER — GABAPENTIN 300 MG PO CAPS
300.0000 mg | ORAL_CAPSULE | Freq: Two times a day (BID) | ORAL | 1 refills | Status: AC
Start: 2024-05-27 — End: 2025-05-27
  Filled 2024-05-27: qty 60, 30d supply, fill #0

## 2024-05-27 NOTE — Care Management Important Message (Addendum)
 Important Message  Patient Details  Name: Erik Strong MRN: [id number] Date of Birth: 03/29/1961   Important Message Given:  Yes - Medicare IM Patient left prior to IM delivery will send a copy to the patient home address.     Estill Llerena 05/27/2024, 2:48 PM

## 2024-05-27 NOTE — TOC Transition Note (Signed)
 Transition of Care [facility]) - Discharge Note   Patient Details  Name: Erik Strong MRN: [id number] Date of Birth: 02/17/1961  Transition of Care [facility]) CM/SW Contact:  Roxie KANDICE Stain, RN Phone Number: 05/27/2024, 8:58 AM   Clinical Narrative:    [name] is stable to discharge home. Follow up apt on [facility]. No ICM (Inpatient Care Management) needs at this time.    Final next level of care: Home/Self Care Barriers to Discharge: Barriers Resolved   Patient Goals and CMS Choice Patient states their goals for this hospitalization and ongoing recovery are:: return home          Discharge Placement                   Home    Discharge Plan and Services Additional resources added to the After Visit Summary for                                       Social Drivers of Health (SDOH) Interventions SDOH Screenings   Food Insecurity: No Food Insecurity (05/25/2024)  Housing: Low Risk  (05/25/2024)  Transportation Needs: No Transportation Needs (05/24/2024)  Utilities: At Risk (05/24/2024)  Tobacco Use: Medium Risk (05/24/2024)     Readmission Risk Interventions    05/27/2024    8:58 AM  Readmission Risk Prevention Plan  Post Dischage Appt Complete  Medication Screening Complete  Transportation Screening Complete

## 2024-05-27 NOTE — Plan of Care (Signed)

## 2024-05-27 NOTE — Plan of Care (Signed)
  Problem: Education: Goal: Knowledge of General Education information will improve Description: Including pain rating scale, medication(s)/side effects and non-pharmacologic comfort measures Outcome: Progressing   Problem: Health Behavior/Discharge Planning: Goal: Ability to manage health-related needs will improve Outcome: Progressing   Problem: Clinical Measurements: Goal: Respiratory complications will improve Outcome: Progressing   Problem: Activity: Goal: Risk for activity intolerance will decrease Outcome: Progressing   Problem: Nutrition: Goal: Adequate nutrition will be maintained Outcome: Progressing   Problem: Coping: Goal: Level of anxiety will decrease Outcome: Progressing   Problem: Pain Managment: Goal: General experience of comfort will improve and/or be controlled Outcome: Progressing   Problem: Education: Goal: Knowledge of disease or condition will improve Outcome: Progressing Goal: Knowledge of the prescribed therapeutic regimen will improve Outcome: Progressing

## 2024-05-27 NOTE — Progress Notes (Signed)
      [address] [place] [address]             [phone]         3 Days Post-Op Procedure(s) (LRB): LOWER LOBE WEDGE RESECTION, LUNG, ROBOT-ASSISTED, LEFT THORACOSCOPIC (Left) BLOCK, NERVE, INTERCOSTAL (Left) LYMPH NODE BIOPSY (Left)  Subjective:  Patient feeling better today.  He is ambulating independently.  He was able to move his bowels.  He is coughing up some blood tinged sputum.  Objective: Vital signs in last 24 hours: Temp:  [97.8 F (36.6 C)-98.7 F (37.1 C)] 98.6 F (37 C) (09/08 0256) Pulse Rate:  [60-85] 60 (09/08 0256) Cardiac Rhythm: Normal sinus rhythm (09/07 1909) Resp:  [14-18] 18 (09/08 0256) BP: (123-144)/(67-88) 131/73 (09/08 0256) SpO2:  [93 %-95 %] 95 % (09/08 0256)  Intake/Output from previous day: 09/07 0701 - 09/08 0700 In: 360 [P.O.:360] Out: -   General appearance: alert, cooperative, and no distress Heart: regular rate and rhythm Lungs: clear to auscultation bilaterally Abdomen: soft, non-tender; bowel sounds normal; no masses,  no organomegaly Wound: clean and dry  Lab Results: Recent Labs    05/25/24 0207 05/26/24 0137  WBC 18.4* 13.3*  HGB 13.6 14.1  HCT 41.3 42.8  PLT 341 301   BMET:  Recent Labs    05/25/24 0207 05/26/24 0137  NA 135 138  K 4.1 4.0  CL 102 102  CO2 26 28  GLUCOSE 121* 116*  BUN 11 13  CREATININE 1.24 1.19  CALCIUM  8.9 8.9    PT/INR: No results for input(s): LABPROT, INR in the last 72 hours. ABG    Component Value Date/Time   PHART 7.244 (L) 05/24/2024 1001   HCO3 25.2 05/24/2024 1001   TCO2 27 05/24/2024 1001   ACIDBASEDEF 3.0 (H) 05/24/2024 1001   O2SAT 100 05/24/2024 1001   CBG (last 3)  Recent Labs    05/24/24 1020  GLUCAP 194*    Assessment/Plan: S/P Procedure(s) (LRB): LOWER LOBE WEDGE RESECTION, LUNG, ROBOT-ASSISTED, LEFT THORACOSCOPIC (Left) BLOCK, NERVE, INTERCOSTAL (Left) LYMPH NODE BIOPSY (Left)  CV- NSR, BP is controlled Pulm- CT has  been removed, he has been weaned off oxygen , continue IS... blood tinged sputum no unexpected Renal- creatinine has been stable Dispo- patient stable, ambulating independently, he has moved his bowels, for discharge home today   LOS: 3 days    Rocky Shad, PA-C 05/27/2024 7:43 AM

## 2024-05-27 NOTE — Progress Notes (Signed)
 Went over discharge paperwork and all questions answered. PIV and telemetry removed. Home medication given back to patient, documentation in chart. All belongings at bedside.

## 2024-05-28 LAB — SURGICAL PATHOLOGY

## 2024-06-03 ENCOUNTER — Other Ambulatory Visit: Payer: Self-pay | Admitting: Thoracic Surgery (Cardiothoracic Vascular Surgery)

## 2024-06-03 DIAGNOSIS — R918 Other nonspecific abnormal finding of lung field: Secondary | ICD-10-CM

## 2024-06-04 ENCOUNTER — Encounter: Payer: Self-pay | Admitting: Thoracic Surgery (Cardiothoracic Vascular Surgery)

## 2024-06-04 ENCOUNTER — Ambulatory Visit (HOSPITAL_COMMUNITY)
Admission: RE | Admit: 2024-06-04 | Discharge: 2024-06-04 | Disposition: A | Discharge status: Home / Self Care | Source: Ambulatory Visit | Attending: Cardiovascular Disease | Admitting: Cardiovascular Disease

## 2024-06-04 ENCOUNTER — Ambulatory Visit
Attending: Thoracic Surgery (Cardiothoracic Vascular Surgery) | Admitting: Thoracic Surgery (Cardiothoracic Vascular Surgery)

## 2024-06-04 VITALS — BP 135/79 | HR 69 | Resp 20 | Ht 73.0 in | Wt 224.4 lb

## 2024-06-04 DIAGNOSIS — R918 Other nonspecific abnormal finding of lung field: Secondary | ICD-10-CM | POA: Diagnosis present

## 2024-06-04 DIAGNOSIS — Z9889 Other specified postprocedural states: Secondary | ICD-10-CM
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
## 2024-06-05 NOTE — Progress Notes (Signed)
 NN reached out to pt to make self-introduction and offer a same day appt at 1:45 for a consultation with Dr. Sherrod. Pt is unable to accept offer as it conflicts with another appt. Pt did agree to an appt offered for 9/23 at 1pm for labs and 1:30pm for a consultation with Cassie, PA and Dr. Sherrod. Note placed in referral communication and scheduler Reena Samuel contacted to make appt. Pt is aware of the location of the cancer center, he is aware that labs will be drawn at 1pm at the cancer center and he does not have to fast for them. NN provided the pt with direct contact information and was encouraged to reach out with any questions or concerns.

## 2024-06-06 ENCOUNTER — Other Ambulatory Visit: Payer: Self-pay
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
## 2024-06-06 NOTE — Progress Notes (Signed)
 The proposed treatment discussed in conference is for discussion purpose only and is not a binding recommendation.  The patients have not been physically examined, or presented with their treatment options.  Therefore, final treatment plans cannot be decided.

## 2024-06-07 ENCOUNTER — Other Ambulatory Visit: Payer: Self-pay

## 2024-06-07 MED ORDER — OXYCODONE HCL 5 MG PO TABS: 5.0000 mg | ORAL_TABLET | Freq: Four times a day (QID) | ORAL | 0 refills | Status: AC | PRN

## 2024-06-07 NOTE — Progress Notes (Signed)
-  He should continue with gabapentin  twice daily -Continue to use tylenol   -Use Zanaflex  and oxycodone  as needed --Refilled oxycodone  5 mg every 6 hours as needed for pain

## 2024-06-11 ENCOUNTER — Inpatient Hospital Stay

## 2024-06-11 ENCOUNTER — Inpatient Hospital Stay: Attending: Physician Assistant | Admitting: Physician Assistant

## 2024-06-11 ENCOUNTER — Other Ambulatory Visit: Payer: Self-pay

## 2024-06-11 ENCOUNTER — Other Ambulatory Visit: Payer: Self-pay | Admitting: Physician Assistant

## 2024-06-11 VITALS — BP 132/82 | HR 71 | Temp 97.9°F | Resp 16 | Wt 224.8 lb

## 2024-06-11 DIAGNOSIS — Z8042 Family history of malignant neoplasm of prostate: Secondary | ICD-10-CM

## 2024-06-11 DIAGNOSIS — Z79899 Other long term (current) drug therapy: Secondary | ICD-10-CM

## 2024-06-11 DIAGNOSIS — R0600 Dyspnea, unspecified: Secondary | ICD-10-CM

## 2024-06-11 DIAGNOSIS — C3492 Malignant neoplasm of unspecified part of left bronchus or lung: Secondary | ICD-10-CM

## 2024-06-11 DIAGNOSIS — C3432 Malignant neoplasm of lower lobe, left bronchus or lung: Secondary | ICD-10-CM | POA: Insufficient documentation

## 2024-06-11 DIAGNOSIS — Z8249 Family history of ischemic heart disease and other diseases of the circulatory system: Secondary | ICD-10-CM

## 2024-06-11 DIAGNOSIS — F909 Attention-deficit hyperactivity disorder, unspecified type: Secondary | ICD-10-CM

## 2024-06-11 DIAGNOSIS — G8929 Other chronic pain: Secondary | ICD-10-CM | POA: Diagnosis not present

## 2024-06-11 DIAGNOSIS — G4730 Sleep apnea, unspecified: Secondary | ICD-10-CM

## 2024-06-11 DIAGNOSIS — Z902 Acquired absence of lung [part of]: Secondary | ICD-10-CM | POA: Insufficient documentation

## 2024-06-11 DIAGNOSIS — F419 Anxiety disorder, unspecified: Secondary | ICD-10-CM | POA: Diagnosis not present

## 2024-06-11 DIAGNOSIS — J8410 Pulmonary fibrosis, unspecified: Secondary | ICD-10-CM

## 2024-06-11 DIAGNOSIS — R059 Cough, unspecified: Secondary | ICD-10-CM

## 2024-06-11 LAB — CBC WITH DIFFERENTIAL (CANCER CENTER ONLY)
Abs Immature Granulocytes: 0.03 K/uL (ref 0.00–0.07)
Basophils Absolute: 0.1 K/uL (ref 0.0–0.1)
Basophils Relative: 1 %
Eosinophils Absolute: 0.2 K/uL (ref 0.0–0.5)
Eosinophils Relative: 2 %
HCT: 44.8 % (ref 39.0–52.0)
Hemoglobin: 15.1 g/dL (ref 13.0–17.0)
Immature Granulocytes: 0 %
Lymphocytes Relative: 30 %
Lymphs Abs: 2.7 K/uL (ref 0.7–4.0)
MCH: 31.4 pg (ref 26.0–34.0)
MCHC: 33.7 g/dL (ref 30.0–36.0)
MCV: 93.1 fL (ref 80.0–100.0)
Monocytes Absolute: 0.7 K/uL (ref 0.1–1.0)
Monocytes Relative: 8 %
Neutro Abs: 5.2 K/uL (ref 1.7–7.7)
Neutrophils Relative %: 59 %
Platelet Count: 458 K/uL — ABNORMAL HIGH (ref 150–400)
RBC: 4.81 MIL/uL (ref 4.22–5.81)
RDW: 13.3 % (ref 11.5–15.5)
WBC Count: 9 K/uL (ref 4.0–10.5)
nRBC: 0 % (ref 0.0–0.2)

## 2024-06-11 LAB — CMP (CANCER CENTER ONLY)
ALT: 28 U/L (ref 0–44)
AST: 19 U/L (ref 15–41)
Albumin: 4.1 g/dL (ref 3.5–5.0)
Alkaline Phosphatase: 80 U/L (ref 38–126)
Anion gap: 7 (ref 5–15)
BUN: 15 mg/dL (ref 8–23)
CO2: 27 mmol/L (ref 22–32)
Calcium: 9.3 mg/dL (ref 8.9–10.3)
Chloride: 106 mmol/L (ref 98–111)
Creatinine: 1.29 mg/dL — ABNORMAL HIGH (ref 0.61–1.24)
GFR, Estimated: >60 mL/min (ref >60)
Glucose, Bld: 106 mg/dL — ABNORMAL HIGH (ref 70–99)
Potassium: 4.4 mmol/L (ref 3.5–5.1)
Sodium: 140 mmol/L (ref 135–145)
Total Bilirubin: 0.3 mg/dL (ref 0.0–1.2)
Total Protein: 7.4 g/dL (ref 6.5–8.1)

## 2024-06-11 NOTE — Patient Instructions (Signed)
-  There are two main categories of lung cancer, they are named based on the size of the cancer cell. One is called Non-Small cell lung cancer. The other type is Small Cell Lung Cancer -The sample (biopsy) that they took of your tumor was consistent with a subtype of Non-small cell lung cancer called Squamous Cell Carcinoma.  -We covered a lot of important information at your appointment today regarding what the treatment plan is moving forward. Here are the the main points that were discussed at your office visit with us  today:  -The treatment that you will receive consists of two chemotherapy drugs, called Carboplatin and Paclitaxel (also referred to as Taxol) -You will need to return 2 days after your treatment to receive an injection. This injection is important because it boosts your infection fighting cells in your body and helps protect you from getting an infection.  -This would be IV every 3 weeks 4 times total. That would be 12 weeks total.  -Side effects include fatigue, drop in the blood count, hair loss, numbness and tingling in the hands/feet, nausea, vomiting, risk of infection, kidney/liver dysfunction.  -If no treatment, we will do a CT in 6 months and see you the week after to review the CT scan.  -The CT scan department number is [phone]. They will call you closer to the time of the CT scan to schedule it.

## 2024-06-13 ENCOUNTER — Inpatient Hospital Stay: Admitting: Licensed Clinical Social Worker

## 2024-06-13 DIAGNOSIS — C3492 Malignant neoplasm of unspecified part of left bronchus or lung: Secondary | ICD-10-CM

## 2024-06-13 NOTE — Progress Notes (Signed)
 [facility] CSW Progress Note  Clinical Child psychotherapist contacted patient by phone to follow-up on SDOH needs.    Interventions: Pt reports he presently has 2 people residing with him, both of which may not be living with him much longer.  If pt loses their financial contribution it will make it impossible for him to continue to pay the mortgage.  CSW discussed options for pt to consider at length.  Pt also is continuing to recover from surgery and met earlier w/ Dr. Sherrod about systemic treatment.  At this time pt has not yet decided if he will move forward w/ any additional treatment.  Contact information for CSW provided.  Pt informed that if he moves forward w/ some form of treatment there may be some financial assistance available should he not have financial support from room mates.        Follow Up Plan:  Patient will contact CSW with any support or resource needs    Devere JONELLE Manna, LCSW Clinical Social Worker [facility]

## 2024-06-26 ENCOUNTER — Other Ambulatory Visit: Payer: Self-pay

## 2024-06-26 MED ORDER — OXYCODONE HCL 5 MG PO TABS: 5.0000 mg | ORAL_TABLET | Freq: Two times a day (BID) | ORAL | 0 refills | Status: AC | PRN

## 2024-06-26 NOTE — Progress Notes (Signed)
-  Sent in refill of oxycodone  for patient -Continue with gabapentin  as prescribed -Attempt to use tylenol  first for pain and then use oxycodone  when pain severe (rated 7-10)

## 2024-06-28 ENCOUNTER — Other Ambulatory Visit (HOSPITAL_COMMUNITY): Payer: Self-pay

## 2024-07-05 ENCOUNTER — Other Ambulatory Visit: Payer: Self-pay | Admitting: Thoracic Surgery (Cardiothoracic Vascular Surgery)

## 2024-07-05 DIAGNOSIS — R918 Other nonspecific abnormal finding of lung field: Secondary | ICD-10-CM

## 2024-07-09 ENCOUNTER — Ambulatory Visit (HOSPITAL_COMMUNITY)
Admission: RE | Admit: 2024-07-09 | Discharge: 2024-07-09 | Disposition: A | Discharge status: Home / Self Care | Source: Ambulatory Visit | Attending: Cardiology | Admitting: Cardiology

## 2024-07-09 ENCOUNTER — Ambulatory Visit
Attending: Thoracic Surgery (Cardiothoracic Vascular Surgery) | Admitting: Thoracic Surgery (Cardiothoracic Vascular Surgery)

## 2024-07-09 VITALS — BP 140/82 | HR 84 | Resp 20 | Ht 73.0 in | Wt 230.0 lb

## 2024-07-09 DIAGNOSIS — Z9889 Other specified postprocedural states: Secondary | ICD-10-CM

## 2024-07-09 DIAGNOSIS — R918 Other nonspecific abnormal finding of lung field: Secondary | ICD-10-CM | POA: Diagnosis present

## 2024-07-09 DIAGNOSIS — R911 Solitary pulmonary nodule: Secondary | ICD-10-CM
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
## 2024-07-24 ENCOUNTER — Ambulatory Visit: Attending: Cardiology | Admitting: Cardiology

## 2024-07-24 ENCOUNTER — Encounter: Payer: Self-pay | Admitting: Cardiology

## 2024-07-24 VITALS — BP 146/76 | HR 74 | Ht 73.0 in | Wt 234.7 lb

## 2024-07-24 DIAGNOSIS — I10 Essential (primary) hypertension: Secondary | ICD-10-CM | POA: Diagnosis not present

## 2024-07-24 DIAGNOSIS — I2510 Atherosclerotic heart disease of native coronary artery without angina pectoris: Secondary | ICD-10-CM | POA: Diagnosis not present

## 2024-07-24 DIAGNOSIS — R002 Palpitations: Secondary | ICD-10-CM

## 2024-07-24 DIAGNOSIS — G4733 Obstructive sleep apnea (adult) (pediatric): Secondary | ICD-10-CM

## 2024-07-24 DIAGNOSIS — I77819 Aortic ectasia, unspecified site: Secondary | ICD-10-CM | POA: Diagnosis not present

## 2024-07-24 MED ORDER — AMLODIPINE BESYLATE 5 MG PO TABS: 5.0000 mg | ORAL_TABLET | Freq: Every day | ORAL | 1 refills | Status: AC

## 2024-07-24 NOTE — Patient Instructions (Addendum)
 Medication Instructions:  Your physician recommends that you continue on your current medications as directed. Please refer to the Current Medication list given to you today.  ** Start Amlodipine 5mg  - 1 tablet by mouth daily. *If you need a refill on your cardiac medications before your next appointment, please call your pharmacy*  Lab Work: None ordered.  If you have labs (blood work) drawn today and your tests are completely normal, you will receive your results only by: MyChart Message (if you have MyChart) OR A paper copy in the mail If you have any lab test that is abnormal or we need to change your treatment, we will call you to review the results.  Testing/Procedures: None ordered.   Follow-Up: At [facility], you and your health needs are our priority.  As part of our continuing mission to provide you with exceptional heart care, our providers are all part of one team.  This team includes your primary Cardiologist (physician) and Advanced Practice Providers or APPs (Physician Assistants and Nurse Practitioners) who all work together to provide you with the care you need, when you need it.  Your next appointment:   6 months  We recommend signing up for the patient portal called MyChart.  Sign up information is provided on this After Visit Summary.  MyChart is used to connect with patients for Virtual Visits (Telemedicine).  Patients are able to view lab/test results, encounter notes, upcoming appointments, etc.  Non-urgent messages can be sent to your provider as well.   To learn more about what you can do with MyChart, go to [email or web address].   Other Instructions Referred to Pulmonology for sleep apnea  Please check your blood pressure daily after you have taken your blood pressure medication x 1 week and send readings through MyChart.

## 2024-07-31 ENCOUNTER — Other Ambulatory Visit: Payer: Self-pay | Admitting: Internal Medicine

## 2024-08-20 ENCOUNTER — Encounter

## 2024-08-21 ENCOUNTER — Encounter

## 2024-08-22 ENCOUNTER — Ambulatory Visit

## 2024-08-22 VITALS — BP 125/76 | HR 105 | Temp 98.0°F | Ht 73.0 in | Wt 238.6 lb

## 2024-08-22 DIAGNOSIS — G4733 Obstructive sleep apnea (adult) (pediatric): Secondary | ICD-10-CM

## 2024-08-22 DIAGNOSIS — J84112 Idiopathic pulmonary fibrosis: Secondary | ICD-10-CM | POA: Diagnosis not present
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
## 2024-08-22 NOTE — Assessment & Plan Note (Signed)
 Follows Dr Geronimo. Continue pirfenidone .

## 2024-08-22 NOTE — Patient Instructions (Signed)
 We did discuss about need for compliance to meet insurance requirement.  The patient has to use CPAP for at least 5 out of 7 days in a week for a consecutive month within the first 3 months after getting CPAP.  Side sleep.     VISIT SUMMARY: Today, you were seen for an evaluation of your obstructive sleep apnea (OSA). You have a history of OSA diagnosed about ten years ago and have struggled with CPAP therapy in the past. You experience symptoms like snoring, dry mouth, morning headaches, and daytime fatigue. We discussed your current health status, including your history of pulmonary fibrosis, heart problems, and weight management challenges.  YOUR PLAN: -OBSTRUCTIVE SLEEP APNEA: Obstructive sleep apnea is a condition where your airway becomes blocked during sleep, causing breathing pauses. We are starting you on CPAP therapy with a hybrid mask to help keep your airway open. The pressure settings are set narrowly to minimize discomfort. Use the CPAP machine while watching TV to get used to it. We will follow up closely to adjust the settings as needed. Please discuss the timing of your medications with your primary care doctor.  INSTRUCTIONS: We will schedule a follow-up appointment to adjust your CPAP settings. Please discuss the timing of your medications with your primary care doctor.                      Contains text generated by [employer]bridge.                                 Contains text generated by [employer]bridge.

## 2024-08-27 ENCOUNTER — Other Ambulatory Visit: Payer: Self-pay

## 2024-08-27 ENCOUNTER — Ambulatory Visit

## 2024-08-27 DIAGNOSIS — J84112 Idiopathic pulmonary fibrosis: Secondary | ICD-10-CM

## 2024-08-27 LAB — PULMONARY FUNCTION TEST
DL/VA % pred: 74 %
DL/VA: 3.06 ml/min/mmHg/L
DLCO unc % pred: 62 %
DLCO unc: 18.6 ml/min/mmHg
FEF 25-75 Pre: 3.1 L/s
FEF2575-%Pred-Pre: 98 %
FEV1-%Pred-Pre: 86 %
FEV1-Pre: 3.39 L
FEV1FVC-%Pred-Pre: 103 %
FEV6-%Pred-Pre: 86 %
FEV6-Pre: 4.32 L
FEV6FVC-%Pred-Pre: 103 %
FVC-%Pred-Pre: 83 %
FVC-Pre: 4.36 L
Pre FEV1/FVC ratio: 78 %
Pre FEV6/FVC Ratio: 99 %

## 2024-08-27 NOTE — Progress Notes (Signed)
Spiro/DLCO performed today. 

## 2024-08-27 NOTE — Patient Instructions (Signed)
Spiro/DLCO performed today. 

## 2024-09-05 ENCOUNTER — Ambulatory Visit: Admitting: Internal Medicine

## 2024-09-05 VITALS — BP 118/78 | HR 71 | Temp 97.8°F | Ht 73.0 in | Wt 244.0 lb

## 2024-09-05 DIAGNOSIS — R911 Solitary pulmonary nodule: Secondary | ICD-10-CM

## 2024-09-05 DIAGNOSIS — E669 Obesity, unspecified: Secondary | ICD-10-CM | POA: Diagnosis not present

## 2024-09-05 DIAGNOSIS — J84112 Idiopathic pulmonary fibrosis: Secondary | ICD-10-CM

## 2024-09-05 DIAGNOSIS — G4733 Obstructive sleep apnea (adult) (pediatric): Secondary | ICD-10-CM

## 2024-09-05 DIAGNOSIS — Z6832 Body mass index (BMI) 32.0-32.9, adult: Secondary | ICD-10-CM

## 2024-09-05 DIAGNOSIS — G4730 Sleep apnea, unspecified: Secondary | ICD-10-CM

## 2024-09-05 DIAGNOSIS — C3490 Malignant neoplasm of unspecified part of unspecified bronchus or lung: Secondary | ICD-10-CM

## 2024-09-05 DIAGNOSIS — C3492 Malignant neoplasm of unspecified part of left bronchus or lung: Secondary | ICD-10-CM

## 2024-09-05 DIAGNOSIS — Z87891 Personal history of nicotine dependence: Secondary | ICD-10-CM

## 2024-09-05 LAB — HEPATIC FUNCTION PANEL
ALT: 42 U/L (ref 3–53)
AST: 23 U/L (ref 5–37)
Albumin: 4 g/dL (ref 3.5–5.2)
Alkaline Phosphatase: 69 U/L (ref 39–117)
Bilirubin, Direct: 0.1 mg/dL (ref 0.1–0.3)
Total Bilirubin: 0.3 mg/dL (ref 0.2–1.2)
Total Protein: 6.8 g/dL (ref 6.0–8.3)

## 2024-09-05 NOTE — Patient Instructions (Addendum)
 Left lower lobe subpleural lung nodule new onset March 2025 greater than 1 cm-status post lobectomy September 2020 for diagnosis of stage I non-small cell lung cancer    Plan  - Clinical monitoring per oncology  #Pulmonary fibrosis - IPF  - Pulmonary function test is stable at least for the last 1 year but progressive compared to before - Glad you quit smoking marijuana - Noted that you had some presyncope with recent pulmonary function test - Noted that you are interested in a pulmonary fibrosis clinical trial but with the cancer history we cannot offer current trials for the another 2 years   Plan -  Continue Esbreit - Check liver function test 09/05/2024 [last checked September 2025 normal] - reoturine followup 3 mo - will avoid PFT at least for next visit till there is some weight loss (Because of prescyncope)  # Obesity and sleep apnea  Plan - I have messaged Dr. Theodoro to get status update on your cPAP - I like the idea of Zepbound for weight loss but this is something you have to talk to primary care   Marijuana use  - I am so happy that you quit smoking marijuana  Plan - Continue marijuana remission   Followup  - 3 months 15 min visit with DR Geronimo or APP   - symptom score and exercise ttst at followup

## 2024-09-05 NOTE — Progress Notes (Signed)
 OV 11/12/2020  Subjective:  Patient ID: Erik Strong, male , DOB: 01/08/1961 , age 63 y.o. , MRN: [id number] , ADDRESS: [address] [place] [address] PCP Merilee, L.Addie, MD Patient Care Team: Merilee, LITTIE.Addie, MD as PCP - General (Family Medicine)  This Provider for this visit: Treatment Team:  Attending Provider: Geronimo Amel, MD    11/12/2020 -   Chief Complaint  Patient presents with   New Patient (Initial Visit)    Doing ok, winded at times     HPI Erik Strong 63 y.o. -referred by Dr. Ozell America to the ILD center because of discovery of pulmonary fibrosis he also has associated emphysema.  He says he has been disabled because of back issues.  He says he has severe ADD and sometimes he will have to search for key for 2 hours.  He says he can get overwhelmed easily and it might take multiple visits for him to fully grasp disease discussion and therapeutic management plan and prognosis.  [facility] Integrated Comprehensive ILD Questionnaire  Symptoms: Insidious onset of shortness of breath gradually getting worse for the last several years.  Episodic dyspnea present severity scale below.  He does have difficulty keeping up with others of his age.  He does have some arthralgia.  He does have cough he coughs at night he brings up some phlegm.  It is whitish-green.  He does clear his throat he does feel this a tickle in the back of the throat occasional wheezing.  No nausea no vomiting no diarrhea.    Past Medical History : As below but no collagen vascular disease or vasculitis.  Does have chronic kidney disease no history of blood clots   ROS:   Does have fatigue arthralgia he does have cracked skin in his fingers.  Does have arthralgia.  Also has daytime somnolence.  He is not able to use his CPAP no recurrent fever no weight loss.  Does have  FAMILY HISTORY of LUNG DISEASE:   Denies   EXPOSURE HISTORY: Between 1978 and 2018 he smoked 30  cigarettes/day and then quit.  Does not smoke cigars no smoking pipes no vaping.  Current marijuana user.  In 2003 use some cocaine.  HOME and HOBBY DETAILS : Single[place]  He is lived in the home for 50 years.  The home itself is 06/06/1961 built and is 62 years old.  There is dampness in the basement he does do some occasional yard work and geographical information systems officer but otherwise no exposure to mold organic antigens he does have cats and dogs   OCCUPATIONAL HISTORY (122 questions) : He has done work with damp air-conditioned spaces.  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  Done pest control work.  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  Done carpentry done wood trimming.  Done oil heating done asbestos work done universal health work.  Done epoxy reasons.  Then would work done environmental manager.  Done plastic work.  Done insulation work.  Done metal grinding done machine operating done industrial work.  Done lack ring done spray painting.  Done waterproofing done 5 working.  Done flooring.  Done metal legs.  Done printmaker.  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  Narrative & Impression    OV 02/08/2021  Subjective:  Patient ID: Erik Strong, male , DOB: 1961-05-29 , age 80 y.o. , MRN: [id number] , ADDRESS: [address] [place] [address] PCP Merilee, L.Addie, MD Patient Care Team: Merilee, LITTIE.Addie, MD as PCP - General (Family Medicine)  This Provider for this visit: Treatment Team:  Attending Provider: Geronimo Amel, MD    02/08/2021 -   Chief Complaint  Patient presents with   Follow-up    Pt states he has been  doing okay since last visit. States he still becomes SOB with exertion.   Follow-up idiopathic pulmonary fibrosis `- Diagnoses given 11/13/2020 [myositis panel negative but CK and aldolase slightly elevated]  -Clinical diagnosis based on male gender, classic UIP and serology only being trace positive although age only 73 and prior smoking history  - Last PFT February 2021 -Last high-resolution CT January 2022 -Started pirfenidone  end of March 2022   Associated emphysema alpha-1 MM  -On Symbicort   Associated significant attention deficit disorder  Normal cardiac stress test March 2022  HPI Erik Strong 63 y.o. -returns for follow-up.  He is now on pirfenidone  since end of March 2022.  He saying he is tolerating it fine except for occasional nausea that is very mild.  There are no real problems.  Overall he is feeling good.  Today he is wearing sleeveless T-shirt and shorts.  He has a history of skin cancer.  He says he is not putting or applying sunscreen although he notes that he needs to.  He is now on pirfenidone  as well.  I cautioned him about this.  His last liver function test was in February 2022.  He will have his liver function test today and he is agreed.  His symptom score and walking desaturation test appears stable.  His last pulmonary function test was over a year ago and he is willing to have this scheduled prior to the next visit.   OV 01/25/2022  Subjective:  Patient ID: Erik Strong, male , DOB: July 21, 1961 , age 86 y.o. , MRN: [id number] , ADDRESS: [address] [place] [address] PCP Merilee, L.Addie, MD Patient Care Team: Merilee, LITTIE.Addie, MD as PCP - General (Family Medicine)  This Provider for this visit: Treatment Team:  Attending Provider: Geronimo Amel, MD    01/25/2022 -   Chief Complaint  Patient presents with   Follow-up    Pt states he has been doing okay since last visit. States his breathing is about the same.    HPI Erik Strong 63  y.o. -returns for follow-up.  It has been a year since I last saw him after that he saw Dr. Darlean in August 2022 and not followed up.  He continues his pirfenidone  although some 7 weeks ago he started running out of it and started taking it only 1 or 2 tablets a day.  And then was completely out of it for 1 week.  Now for the last 2 weeks after getting his insurance paperwork sorted out he has been taking it at full dose.  He is tolerating it well.  There is no sunburn or any other side effect.  The main issue is that he does have nausea but no vomiting.  The nausea is mild.  He has no complaints overall he feels stable.  His last lab  work was last year.  His last pulmonary function test was 2 years ago last CT scan of the chest was 1 year ago.  He does not smoke.  He denies any new health issues no changes in medications.  He is on disability for his hip and his knee does bother him.  However he does think he can walk a 6-minute walk test.   His symptoms: Walking desaturation appears stable over time.SABRA  His weight is also stable.  He does have OSA but unable to tolerate     OV 05/06/2022  Subjective:  Patient ID: Erik Strong, male , DOB: [birth year] , age 29 y.o. , MRN: [id number] , ADDRESS: [address] [place] [address] PCP Merilee, L.Addie, MD Patient Care Team: Merilee, LITTIE.Addie, MD as PCP - General (Family Medicine)  This Provider for this visit: Treatment Team:  Attending Provider: Geronimo Amel, MD   05/06/2022 -   Chief Complaint  Patient presents with   Follow-up    PFT performed today.  Pt states he has been doing okay since last visit. States his breathing has been doing okay.     HPI Erik Strong 63 y.o. -returns for follow-up.  At this point in time he is stable on pirfenidone .  His symptoms are minimal.  He does say that when he goes out and son there is some burning but there is no rash.  He has noticed that niacin in the past.  He was wondering if it  was because of pirfenidone .  I confirmed the same.  But he does use sunscreen and this brings down the bone intensity and definitely no skin rash at any time or erythema.  He has no new complaints.  He had pulmonary function test shows slight reduction that is probably not clinically significant.  He states that he ran out of Symbicort  due to insurance issues few months ago and just ordered it yesterday.  He suspects is because of that.  His symptom score is actually better.  He had high-resolution CT chest in summer 2023 and is stable.  Overall he believes he is stable.   We discussed clinical trials as a care option and coverered the following  CT Chest data  OV 07/05/2022  Subjective:  Patient ID: Erik Strong, male , DOB: [birth year] , age 17 y.o. , MRN: [id number] , ADDRESS: [address] [place] [address] PCP Merilee, L.Addie, MD Patient Care Team: Merilee, LITTIE.Addie, MD as PCP - General (Family Medicine)  This Provider for this visit: Treatment Team:  Attending Provider: [place]   [address]/17/2023 -   Chief Complaint  Patient presents with   Follow-up    Cough a little worse. Fever 1 week ago.  SOB about the same.  Would like flu vaccine today     HPI Erik Strong 63 y.o. -returns for routine follow-up.  He states he is doing stable.  Infection from scores are stable.  A few weeks ago he had temperature of 100.5 COVID test was negative.  He did have some slight increased cough but then this settled.  He is back to baseline.  He is tolerating pirfenidone  well except for 1 or 2 minutes of dizziness 30 minutes after taking the pirfenidone  it is extremely mild.  His smoking is in remission.  He has not done cocaine in 20 years.  He still smokes marijuana.  He says there is no craving for it he does smoke for fun.  Last use was few days ago.  We spoke about the hazards of smoking marijuana.  He is interested in clinical trials and this will exclude patients who  smoke marijuana.  Regardless I have told him about the need to quit marijuana to protect his lung health.  He understands this.  He wants to have his COVID mRNA booster.  He will have it on his own.  This week and is going on a charge trip to [place] .  He will have his RSV and flu shot today.  We discussed this.   For his emphysema: His son Symbicort  and continues this  He has slightly unexplained elevated CK.  Last check in August 2023 this was coming down.  We will check this again. OV 10/18/2022  Subjective:  Patient ID: Erik Strong, male , DOB: [birth year] , age 14 y.o. , MRN: [id number] , ADDRESS: [address] [place] [address] PCP Merilee, L.Addie, MD Patient Care Team: Merilee, LITTIE.Addie, MD as PCP - General (Family Medicine) Hobart Powell BRAVO, MD as PCP - Cardiology (Cardiology)  This Provider for this visit: Treatment Team:  Attending Provider: Geronimo Amel, MD  10/18/2022 -   Chief Complaint  Patient presents with   Follow-up    PFT done today. Breathing is unchanged since his last visit. He has occ cough with exertion. He uses albuterol  3-4 x per wk.      HPI Erik Strong 63 y.o. -returns for follow-up.  Last seen in the fall 2023.  He states that his IPF is stable dyspnea stable.  Tolerating pirfenidone  well.  99% compliant.  No new medical problems.  His CK was elevated and his statin got changed.  His most recent CK in October 2023 was normal.  In the interim no urgent care visits no emergency room visits no surgeries no admissions.  Symptom scores below.  Weight is below.  They are all stable.  He still continues to smoke marijuana on and off.  He is asking for a letter to avoid jury duty.  I support him in this but he does not have the summons with him.  He is interested in clinical research.  We discussed the possibility of participating in [employer] sponsored bone scans DEXA study.  The study involves getting blood for calcium  and  liver function test and also spine x-rays.  He believes he might have osteopenia.  Last bone scan was 10 years ago.  He does not have implants no cancers.  No steroid use.  Have asked the research coordinator to prescreen him.  He is reading the consent form and I gave him the consent form.  The study might be closed for accrual/enrollment.  But we will check with the sponsor. -> lter prescreen faile dbecuase of hardware from # femur in past      OV 08/24/2023  Subjective:  Patient ID: Erik Strong, male , DOB: 25-May-1961 , age 82 y.o. , MRN: [id number] , ADDRESS: [address] [place] [address] PCP Leonel Cole, MD Patient Care Team: Leonel Cole, MD as PCP - General (Family Medicine) Hobart Powell BRAVO, MD (Inactive) as PCP - Cardiology (Cardiology)  This Provider for this visit: Treatment Team:  Attending Provider: Geronimo Amel, MD  08/24/2023 -   Chief Complaint  Patient presents with   Follow-up    Pft f/u       HPI Erik Strong 63 y.o. -presents for IPF follow-up.  Last seen earlier this year.  In the interim the only medication change is that he is no longer taking long-acting Adderall but otherwise taking regular Adderall. Interim Health status: No new complaints No new medical problems. No new surgeries. No ER visits. No Urgent care visits. No changes to medications.  He continues to live with his girlfriend and her son.  That is social support.  He continues to smoke a little marijuana here and there.  In terms of his pulmonary fibrosis he is stable.  In terms of his pirfenidone  intake not a problem except for some very mild nausea for 5 or 10 minutes after taking pirfenidone .  He feels stable.  He had pulmonary function test and continued stability since he started his Esbriet /pirfenidone .  Last set of liver function normal in June 2024.  He needs another repeat 1 today.  He did discuss about lung transplant as a care option.  Went to the details of transplant.   Explained to him the financial package he needs.  The social support he needs him he also needs to be free of marijuana smoking.  He needs to lose a lot of weight.  At this point in time he is not interested in a referral but just kind of wanted to know his options.  It appears he may not be interested in transplant.  Nevertheless he is too early in the course of his disease.      04/11/2024- Interim hx  Discussed the use of AI scribe software for clinical note transcription with the patient, who gave verbal consent to proceed.  History of Present Illness   Erik Strong is a 63 year old male with idiopathic pulmonary fibrosis who presents for follow-up to review PET scan results.  He presents for follow-up to review PET scan results concerning a new left lower lobe subpleural lung nodule. The nodule, first noted in March 2025, has enlarged to 17 millimeters according to imaging. There is no evidence of metastatic disease.  He has a history of idiopathic pulmonary fibrosis with concerns about progression noted on a CT scan from April 2025. He is currently on Esbriet  (pirfenidone ) for IPF, which he restarted after a temporary discontinuation due to diarrhea. Diarrhea has improved as long as he avoids beef. He was previously instructed to restart Esbriet  with a gradual increase in dosage: one pill once daily for one week, then one pill twice daily for one week, and finally one pill three times daily, taken with food.  He has a family history of lung cancer, as his mother had lung cancer.   OV 02/01/2024  Subjective:  Patient ID: Erik Strong, male , DOB: [birth year] , age 4 y.o. , MRN: [id number] , ADDRESS: [address] [place] [address] PCP Leonel Cole, MD Patient Care Team: Leonel Cole, MD as PCP - General (Family Medicine) Kate Lonni CROME, MD as PCP - Cardiology (Cardiology)  This Provider for this visit: Treatment Team:  Attending Provider: Geronimo Amel, MD    02/01/2024 -   Chief Complaint  Patient presents with   Follow-up    Recent CT Chest 12/26/23 showed pulmonary nodule. He has been seeing GI for severe diarrhea. Breathing has been stable.     HPI Erik Strong 63 y.o. -returns for follow-up.  Last seen in December 2024.  He feels his breathing is overall stable.  He is 99% or more compliant with his Esbriet  and he believes he does not have any side effects from this.  However  he tells me that for the last 6 weeks or so has had significant diarrhea especially for the last 3 to 4 weeks.  He ended up [facility].  Some of the chart reviewed.  He has seen gastroenterology.  There is concerned that because of his gardening and tick bites he is having some meat allergy  and he subjected himself to food allergy  testing.  Further GI workup is in progress.  I did ask him if he was aware that the drug Pirfenidone  (Esbriet ) can occasionally cause diarrhea although more commonly nausea and vomiting and low appetite.  He was surprised by this.  He feels that the drug is not the cause of his diarrhea because his significant other is also having diarrhea at home and therefore they think there is an external source for this.  Nevertheless I counseled him about the possibility of diarrhea from the drugs that I am prescribing pirfenidone .  He has agreed for a temporary holiday on this.  Reviewed recent labs.  Of note all this ER visits have also resulted in the diagnosis of new left lower lobe nodule.  Radiology report in April 2025 shows a greater than 1 cm left lower lobe subpleural nodule and they have recommended a 1 year follow-up.  I disagree with this.  Visualization of the images with him show that the onset of this nodule is in March 2025.  It is present in April 2025.  With his marijuana smoking age and fibrosis history this significant pretest probably for malignancy.  I will follow the original recommendation from March 2025 to do a short-term  follow-up which would be to get a PET scan to at the end of June 2025.  He is naturally nervous about this.  But he is in alignment with the plan.  His symptom scores are below and overall symptoms are stable.       CT Chest data from date: 12/26/23  - personally visualized and independently interpreted : yes - my findings are: NEW ONSET MARCH 2025. Absent before that and present APRIL 2025 Narrative & Impression  CLINICAL DATA:  Follow-up pulmonary density from prior CT 12/08/2023   EXAM: CT CHEST WITHOUT CONTRAST   TECHNIQUE: Multidetector CT imaging of the chest was performed following the standard protocol without IV contrast.   RADIATION DOSE REDUCTION: This exam was performed according to the departmental dose-optimization program which includes automated exposure control, adjustment of the mA and/or kV according to patient size and/or use of iterative reconstruction technique.   COMPARISON:  CT angio chest 12/08/2023   FINDINGS: Cardiovascular: No significant vascular findings. Normal heart size. No pericardial effusion. No significant coronary artery calcifications. No pericardial effusion   Mediastinum/Nodes: Choose 1. No significant change compared with prior examination small precarinal adenopathy measuring 8 mm in maximum short axis. Aortic pulmonic window adenopathy measuring 5 mm.   Lungs/Pleura: Lungs are clear. No pleural effusion or pneumothorax. There is marked prominence of the interstitial markings with thickening of the interlobar and interlobular septal with centrilobular and paraseptal emphysema consistent with chronic interstitial lung disease COPD and extensive bilateral basilar subpleural cystic bullous changes consistent with advanced chronic interstitial lung disease COPD and. Comparison with prior examination there is stable 7 mm right lower lobe nodule.   Stable left lower lobe posterior costophrenic sulcus 11 by 7 mm nodular density which  could correlate with nodular atelectasis. Lung-RADS 2, benign appearance or behavior. Continue annual screening with low-dose chest CT without contrast in 12 months.  Upper Abdomen: No acute abnormality.   Musculoskeletal: No chest wall mass or suspicious bone lesions identified.   IMPRESSION: *Stable 7 mm right lower lobe nodule. *Stable 11 x 7 mm left lower lobe posterior costophrenic sulcus nodular density which could correlate with nodular atelectasis. *Lung-RADS 2, benign appearance or behavior. Continue annual screening with low-dose chest CT without contrast in 12 months. *Advanced chronic interstitial lung disease COPD. *Stable small mediastinal adenopathy.     Electronically Signed   By: Franky Chard M.D    OV 05/10/2024  Subjective:  Patient ID: Erik Strong, male , DOB: [birth year] , age 70 y.o. , MRN: [id number] , ADDRESS: [address] [place] [address] PCP Leonel Cole, MD Patient Care Team: Leonel Cole, MD as PCP - General (Family Medicine) Kate Lonni CROME, MD as PCP - Cardiology (Cardiology)  This Provider for this visit: Treatment Team:  Attending Provider: Geronimo Amel, MD      05/10/2024 -   Chief Complaint  Patient presents with   Medical Management of Chronic Issues   Interstitial Lung Disease    Breathing is stable. He has occ cough with grey colored sputum.      HPI Erik Strong 63 y.o. -returns for follow-up.  Since I last saw him in May 2025 in terms of his pulmonary fibrosis he feels stable.  His symptom score is stable.  His exercise hypoxemia test is stable had a summer 2025 pulmonary function test.  It is despite scheduling it.  He is continuing his pirfenidone  and is tolerating it well.  He had diarrhea which I thought was from pirfenidone  but he told me that he stopped beef and then the diarrhea resolved.  His other issue is that he has a left lower lobe lung nodule which was 12 mm in April 2025.  So I  ordered a PET scan that he visited with my nurse practitioner in July 2025.  The nodule has grown to 17 mm and is quite active and PET scan.  After nurse practitioner discussed with Dr. Lamar Chris referral has been made to Dr. Garnette Millers the surgeon and this appointment scheduled for May 15, 2024.  However patient still not had pulmonary function test since December 2024.     OV 09/05/2024  Subjective:  Patient ID: Erik Strong, male , DOB: 1961/05/21 , age 47 y.o. , MRN: [id number] , ADDRESS: [address] [place] [address] PCP Leonel Cole, MD Patient Care Team: Leonel Cole, MD as PCP - General (Family Medicine) Kate Lonni CROME, MD as PCP - Cardiology (Cardiology) Prentis Duwaine BROCKS, RN as Oncology Nurse Navigator  This Provider for this visit: Treatment Team:  Attending Provider: Geronimo Amel, MD     Follow-up idiopathic pulmonary fibrosis `- Diagnoses given 11/13/2020 [myositis panel negative but CK and aldolase slightly elevated]  -Clinical diagnosis based on male gender, classic UIP and serology only being trace positive although age only 26 and prior smoking history  - Last PFT February 2021 -Last high-resolution CT January 2022 -> June 2023 is stable -Started pirfenidone  end of March 2022   Associated emphysema alpha-1 MM  -On Symbicort   Associated significant attention deficit disorder  OSA unable to tolerate CPAP  Normal cardiac stress test March 2022. 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 - STAGE1B Sept 2025 - Left lower lobe subpleural nodule new onset March 2025 at 12mm > strong PET scan positivity in July 2025 at 17 mm -  Hendrickson who performed a robot-assisted wedge  resection on 05/24/2024.  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  The margins were negative.  - Dr Sherrod 06/11/24: benefit for adjuvant systemic chemotherapy for patient with tumor size of 4.0 or larger lung cancer. The size of his tumor is very close so I will give him the option of consideration of 4 cycles of adjuvant systemic chemotherapy with carboplatin and paclitaxel versus close monitoring and observatio   09/05/2024 -   Chief Complaint  Patient presents with   Interstitial Lung Disease    Pt states since LOV breathing has not been too bad,  SOB occurs w/ exertion Dry cough     HPI Erik Strong 63 y.o. -Erik Strong is a 63 year old male with IPF pulmonary fibrosis  who presents for follow-up. Since lst visit  - new diagnosis of lung cancer stge 1: He underwent surgery for early-stage lung cancer and has chosen not to pursue chemotherapy. He experiences pain in the surgical area, particularly when sneezing, which occurs dail  - MJ smoking  Since the surgery, he has quit smoking marijuana, which has reduced his coughing. He experiences occasional cravings but has not resumed smoking, despite his friends continuing to do so. His breathing has remained stable since quitting marijuana, with less coughing.  - General Health   -  He does not use a nasal inhaler but experiences a runny nose occasionally, especially in winter.  - He received a flu shot but has yet to get a COVID shot.  - IPF   - He continues to take Esbriet  for pulmonary fibrosis and reports no side effects. He expresses interest in participating in a clinical trial, partly due to financial reasons following the death of a roommate who contributed to his household income. 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 Explained to him primary motivation for clinical trial is for benefit of science. We offere 2 trials right now  for IPF but with recent caner history earliest he will quality in one of the trials is 2 years. e recalls almost passing out during a breathing test, which he attributes to his breathing difficulties. He feels is becaue of obesity. Despite lobecoty - PFT pre and post surgery shows stability  - Sleep APNEA  - He has sleep apnea and obesity, with a body mass index of over 32. He has previously tried CPAP therapy without success and is interested in discussing weight loss medication with his primary care doctor.  He saw Dr Theodoro and I messsaged Dr Theodoro who will have someone reach out about starting cPAP. He wants Zepbound for weight loss . DR Theodoro indicated that is only for peopl with BMI > 40 but he believes he is eligible at [facility]. Advsed him to t talk to PCP       SYMPTOM SCALE - ILD 11/12/2020  02/08/2021 Now on esbriet  225# 01/25/2022 231#.  Pirfenidone  present 05/06/2022 232#  = esbriet  07/05/2022  10/18/2022 229#e esbriet  08/24/2023 esbriet  02/01/2024 esbret 05/10/2024 S pirfenidone  09/05/2024 Esbiret S/p lobectomy sept 2025  O2 use ra ts ra ra ra ra ra ra ra ra  Shortness of Breath 0 -> 5 scale with 5 being worst (  score 6 If unable to do)           At rest 1 0/5 1 0 0 0 0 0 0 1  Simple tasks - showers, clothes change, eating, shaving 2 1 2  0.5 1 1 1 1  0 2  Household (dishes, doing bed, laundry) 2 2 3  0/5 2 1 3 2  1.5 2  Shopping 3 3 2 1 2 2 2 3 2 3   Walking level at own pace 1 2 2  1.5 1 2 2 2 1 2   Walking up Stairs 3 3 3  2.5 3 3 3 3 3 3   Total (30-36) Dyspnea Score 12 11.5 13 6 9 9 11 11  7.5 13  How bad is your cough? 2 1.5 2 1 1 1 1 2 1  0.5  How bad is your fatigue 2 3 3 2 2 3 3 3 3 3   How bad is nausea 0 0.5 2 1 1 2  0 0 1 0  How bad is vomiting?  0 0 0 0 0 0 1 0 0 0   How bad is diarrhea? 0 0 0 0 0 0 2 3 0 0   How bad is anxiety? 2 1 2 2 1 2 2 2 1 1   How bad is depression 1 0 0 0 1 1 0 2 1 0            SIT STAND TEST - goal 15 times   02/01/2024  05/10/2024   09/05/2024   O2 used ra ra ra  PRobe - finter or forehead finge finger giner  Number sit and stand completed - goal 15 15 15  15   Time taken to complete 50 sec 50 sec 59 sec  Resting Pulse Ox/HR/Dyspnea  96% and 62/min and dyspnea of 1/10  98% and hr 65 and score 1 97% and HR 76 adnd score 3  Peak measures 96 % and 64/min and dyspnea of 4/10 95% and HR 88 and score 3 98% andHR 102 and 5.5 score  Final Pulse Ox/HR 97% and 69/min and dyspnea of 3/10 95% and HR 66 and score 1 95% and HR 76 and score 4  Desaturated </= 88% no no no  Desaturated <= 3% points no no no  Got Tachycardic >/= 90/min no no yes  Miscellaneous comments x  A bit slower    PFT     Latest Ref Rng & Units 08/27/2024    3:54 PM 05/13/2024    9:27 AM 08/24/2023    2:25 PM 10/18/2022    9:06 AM 05/06/2022    1:59 PM 10/21/2019    1:53 PM  PFT Results  FVC-Pre L 4.36  4.27  4.36  4.41  4.41  4.59   FVC-Predicted Pre % 83  81  82  83  81  84   FVC-Post L      4.72   FVC-Predicted Post %      86   Pre FEV1/FVC % % 78  78  80  81  79  82   Post FEV1/FCV % %      79   FEV1-Pre L 3.39  3.31  3.51  3.56  3.50  3.76   FEV1-Predicted Pre % 86  83  88  88  85  90   FEV1-Post L      3.75   DLCO uncorrected ml/min/mmHg 18.60  16.31  22.58  21.52  20.68  27.70   DLCO UNC% % 62  54  75  71  66  88   DLCO corrected ml/min/mmHg   22.58  21.52  20.68  27.11   DLCO COR %Predicted %   75  71  66  86   DLVA Predicted % 74  61  83  75  83  91   TLC L  7.09     7.98   TLC % Predicted %  93     103   RV % Predicted %  119     140        LAB RESULTS last 96 hours No results found.       has a past medical history of ADD (attention deficit disorder), Anxiety, Arthritis, Chronic pain, Diabetes mellitus without complication (HCC), Diarrhea, History of kidney stones, ILD (interstitial lung disease) (HCC), Pulmonary fibrosis (HCC), Sleep apnea, and Temporal head injury.   reports that he quit smoking about 7 years ago. His smoking  use included cigarettes. He started smoking about 46 years ago. He has a 84 pack-year smoking history. He has never used smokeless tobacco.  Past Surgical History:  Procedure Laterality Date   HIP SURGERY Left    I & D EXTREMITY Left 09/02/2019   Procedure: IRRIGATION AND DEBRIDEMENT HAND;  Surgeon: Sebastian Lenis, MD;  Location: [facility] OR;  Service: Orthopedics;  Laterality: Left;   INTERCOSTAL NERVE BLOCK Left 05/24/2024   Procedure: BLOCK, NERVE, INTERCOSTAL;  Surgeon: Kerrin Elspeth BROCKS, MD;  Location: [facility] OR;  Service: Thoracic;  Laterality: Left;   LYMPH NODE BIOPSY Left 05/24/2024   Procedure: LYMPH NODE BIOPSY;  Surgeon: Kerrin Elspeth BROCKS, MD;  Location: [facility] OR;  Service: Thoracic;  Laterality: Left;   TOOTH EXTRACTION Bilateral 12/30/2016   Procedure: EXTRACTION MOLARS;  Surgeon: Glendia Primrose, DDS;  Location: [facility] OR;  Service: Oral Surgery;  Laterality: Bilateral;  Extraction of teeth four, five, twenty-six, thirty; removal of bilateral mandibular lingual tori   WEDGE RESECTION, LUNG, ROBOT-ASSISTED, THORACOSCOPIC Left 05/24/2024   Procedure: LOWER LOBE WEDGE RESECTION, LUNG, ROBOT-ASSISTED, LEFT THORACOSCOPIC;  Surgeon: Kerrin Elspeth BROCKS, MD;  Location: [facility] OR;  Service: Thoracic;  Laterality: Left;  Robotic Left Lower Lobe Wedge Resection    Allergies[1]  Immunization History  Administered Date(s) Administered   Fluzone Influenza virus vaccine,trivalent (IIV3), split virus 05/10/2018, 05/04/2019, 09/08/2020   Influenza, Seasonal, Injecte, Preservative Fre 06/01/2023   Influenza,inj,Quad PF,6+ Mos 09/09/2016, 06/15/2017, 05/10/2018, 05/04/2019, 07/05/2022   Moderna Sars-Covid-2 Vaccination 03/08/2023   PFIZER(Purple Top)SARS-COV-2 Vaccination 12/16/2019, 01/08/2020, 09/08/2020   PNEUMOCOCCAL CONJUGATE-20 11/22/2021   Respiratory Syncytial Virus Vaccine ,Recomb Aduvanted(Arexvy ) 07/05/2022   Td 07/10/2006   Tdap 05/14/2012, 12/03/2014   Zoster Recombinant(Shingrix) 05/08/2019    Zoster, Live 05/08/2019, 05/20/2020    Family History  Problem Relation Age of Onset   Prostate cancer Father    CAD Maternal Uncle     Current Medications[2]      Objective:   Vitals:   09/05/24 0928  BP: 118/78  Pulse: 71  Temp: 97.8 F (36.6 C)  TempSrc: Oral  SpO2: 98%  Weight: 244 lb (110.7 kg)  Height: 6' 1 (1.854 m)    Estimated body mass index is 32.19 kg/m as calculated from the following:   Height as of this encounter: 6' 1 (1.854 m).   Weight as of this encounter: 244 lb (110.7 kg).  @WEIGHTCHANGE @  [employer]   09/05/24 0928  Weight: 244 lb (110.7 kg)     Physical Exam   General: No distress. Looks well O2 at rest: no Cane present:  no Sitting in wheel chair: no Frail: no Obese: no Neuro: Alert and Oriented x 3. GCS 15. Speech normal Psych: Pleasant Resp:  Barrel Chest - no.  Wheeze - no, Crackles - yes, No overt respiratory distress CVS: Normal heart sounds. Murmurs - no Ext: Stigmata of Connective Tissue Disease - no HEENT: Normal upper airway. PEERL +. No post nasal drip        Assessment/     Assessment & Plan IPF (idiopathic pulmonary fibrosis) (HCC)  OSA (obstructive sleep apnea)  Primary sleep apnea of newborn, unspecified type  Squamous cell carcinoma lung, left (HCC)    PLAN Patient Instructions  Left lower lobe subpleural lung nodule new onset March 2025 greater than 1 cm-status post lobectomy September 2020 for diagnosis of stage I non-small cell lung cancer    Plan  - Clinical monitoring per oncology  #Pulmonary fibrosis - IPF  - Pulmonary function test is stable at least for the last 1 year but progressive compared to before - Glad you quit smoking marijuana - Noted that you had some presyncope with recent pulmonary function test - Noted that you are interested in a pulmonary fibrosis clinical trial but with the cancer history we cannot offer current trials for the another 2 years   Plan -  Continue  Esbreit - Check liver function test 09/05/2024 [last checked September 2025 normal] - reoturine followup 3 mo - will avoid PFT at least for next visit till there is some weight loss (Because of prescyncope)  # Obesity and sleep apnea  Plan - I have messaged Dr. Theodoro to get status update on your cPAP - I like the idea of Zepbound for weight loss but this is something you have to talk to primary care   Marijuana use  - I am so happy that you quit smoking marijuana  Plan - Continue marijuana remission   Followup  - 3 months 15 min visit with DR Geronimo or APP   - symptom score and exercise ttst at followup    FOLLOWUP    Return for  - 3 months 15 min visit with DR Geronimo or APP.  ( Level 05 visit E&M 2024: Estb >= 40 min  in  visit type: on-site physical face to visit  in total care time and counseling or/and coordination of care by this undersigned MD - Dr Dorethia Geronimo. This includes one or more of the following on this same day 09/05/2024: pre-charting, chart review, note writing, documentation discussion of test results, diagnostic or treatment recommendations, prognosis, risks and benefits of management options, instructions, education, compliance or risk-factor reduction. It excludes time spent by the CMA or office staff in the care of the patient. Actual time 40 min)   SIGNATURE    Dr. Dorethia Geronimo, M.D., F.C.C.P,  Pulmonary and Critical Care Medicine Staff Physician, [facility] Center Director - Interstitial Lung Disease  Program  Pulmonary Fibrosis [facility] at [facility] [place], [place], [address]  Pager: [phone], If no answer or between  15:00h - 7:00h: call [phone] Telephone: [phone]  6:33 PM 09/05/2024     [1]  Allergies Allergen Reactions   Alpha-Gal Diarrhea    Acute/chronic diarrhea started approximately April 2025. Alpha gal allergy  noted May 2025.  On 02/05/2024 Ellouise Console, PA-C noted  Alpha gal test shows low level of increased allergen to beef. No allergy  to pork. Recommended to limit and avoid beef. 04/11/2024 noted  that patient symptoms have improved with avoidance of beef    Atomoxetine Other (See Comments)   Other Other (See Comments)   Codeine Nausea And Vomiting   Macrodantin [Nitrofurantoin Macrocrystal] Rash   Shellfish Allergy  Nausea And Vomiting  [2]  Current Outpatient Medications:    acetaminophen  (TYLENOL ) 325 MG tablet, Take 2 tablets (650 mg total) by mouth every 6 (six) hours. (Patient taking differently: Take 650 mg by mouth every 6 (six) hours as needed.), Disp: , Rfl:    albuterol  (VENTOLIN  HFA) 108 (90 Base) MCG/ACT inhaler, TAKE 2 PUFFS BY MOUTH EVERY 6 HOURS AS NEEDED FOR WHEEZE OR SHORTNESS OF BREATH, Disp: 6.7 each, Rfl: 3   ALPRAZolam  (XANAX ) 0.5 MG tablet, Take 0.5 mg by mouth at bedtime., Disp: , Rfl:    amLODipine  (NORVASC ) 5 MG tablet, Take 1 tablet (5 mg total) by mouth daily., Disp: 90 tablet, Rfl: 1   amphetamine -dextroamphetamine  (ADDERALL) 15 MG tablet, Take 15-30 mg by mouth See admin instructions. Take 30 mg by mouth in the morning and 15 mg in the afternoon, Disp: , Rfl:    atorvastatin  (LIPITOR) 20 MG tablet, TAKE 1 TABLET BY MOUTH EVERY DAY, Disp: 90 tablet, Rfl: 3   ezetimibe  (ZETIA ) 10 MG tablet, TAKE 1 TABLET BY MOUTH EVERY DAY, Disp: 90 tablet, Rfl: 2   levothyroxine  (SYNTHROID ) 50 MCG tablet, Take 50 mcg by mouth daily before breakfast., Disp: , Rfl:    Pirfenidone  (ESBRIET ) 801 MG TABS, Take 1 tablet (801 mg) by mouth in the morning, at noon, and at bedtime., Disp: 270 tablet, Rfl: 1   sildenafil  (REVATIO ) 20 MG tablet, Take 20 mg by mouth daily as needed (FOR ED). , Disp: , Rfl:    SYMBICORT  160-4.5 MCG/ACT inhaler, INHALE 2 PUFFS INTO THE LUNGS TWICE A DAY, Disp: 10.2 each, Rfl: 6   tiZANidine  (ZANAFLEX ) 2 MG tablet, Take 2 mg by mouth every 8 (eight) hours as needed for muscle spasms., Disp: , Rfl:    gabapentin  (NEURONTIN ) 300  MG capsule, Take 1 capsule (300 mg total) by mouth 2 (two) times daily. (Patient not taking: Reported on 09/05/2024), Disp: 60 capsule, Rfl: 1

## 2024-09-06 ENCOUNTER — Other Ambulatory Visit: Payer: Self-pay | Admitting: Thoracic Surgery (Cardiothoracic Vascular Surgery)

## 2024-09-06 DIAGNOSIS — R918 Other nonspecific abnormal finding of lung field: Secondary | ICD-10-CM

## 2024-09-07 ENCOUNTER — Other Ambulatory Visit: Payer: Self-pay | Admitting: Cardiology

## 2024-09-17 ENCOUNTER — Ambulatory Visit (HOSPITAL_COMMUNITY)
Admission: RE | Admit: 2024-09-17 | Discharge: 2024-09-17 | Disposition: A | Discharge status: Home / Self Care | Source: Ambulatory Visit | Attending: Internal Medicine | Admitting: Internal Medicine

## 2024-09-17 ENCOUNTER — Encounter: Payer: Self-pay | Admitting: Thoracic Surgery (Cardiothoracic Vascular Surgery)

## 2024-09-17 ENCOUNTER — Ambulatory Visit: Admitting: Thoracic Surgery (Cardiothoracic Vascular Surgery)

## 2024-09-17 VITALS — BP 116/74 | HR 97 | Resp 20 | Ht 73.0 in | Wt 245.0 lb

## 2024-09-17 DIAGNOSIS — R918 Other nonspecific abnormal finding of lung field: Secondary | ICD-10-CM | POA: Diagnosis present

## 2024-09-17 DIAGNOSIS — Z9889 Other specified postprocedural states: Secondary | ICD-10-CM
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
## 2024-09-27 ENCOUNTER — Telehealth: Payer: Self-pay

## 2024-09-27 NOTE — Telephone Encounter (Signed)
 Received CMN from [facility]. Placed into Dr. Lavena sign folder. Once signed, will fax back to [facility] at [phone] along with f32f notes and sleep study.

## 2024-10-08 ENCOUNTER — Telehealth: Payer: Self-pay

## 2024-10-08 NOTE — Telephone Encounter (Signed)
 Pt is scheduled to see Almarie Ferrari, NP tomorrow, 10-09-2024 for a cpap f/u. I could not find pt in [facility]. I called [facility] and spoke to [facility]. Brad tagged us  in [place] but states pt is not compliant with the machine. The set up date is also dated for 09-24-2024 and his appt will need to be pushed out. I will call pt and make sure his appt is rescheduled and make sure pt will us  his machine.   I called and spoke to pt. Pt states he has been using his machine. Pt states he feels like he is suffocating and sleeps better without his machine. Pt would like to keep his appt to better discuss with Beth about what can be done to make his machine more comfortable. I informed pt that we would keep his appt and discuss this tomorrow. NFN

## 2024-10-09 ENCOUNTER — Ambulatory Visit: Admitting: Primary Care

## 2024-10-09 ENCOUNTER — Encounter: Payer: Self-pay | Admitting: Primary Care

## 2024-10-09 VITALS — BP 118/74 | HR 74 | Temp 97.7°F | Ht 73.0 in | Wt 246.0 lb

## 2024-10-09 DIAGNOSIS — Z6830 Body mass index (BMI) 30.0-30.9, adult: Secondary | ICD-10-CM

## 2024-10-09 DIAGNOSIS — E669 Obesity, unspecified: Secondary | ICD-10-CM

## 2024-10-09 DIAGNOSIS — J84112 Idiopathic pulmonary fibrosis: Secondary | ICD-10-CM

## 2024-10-09 DIAGNOSIS — Z87891 Personal history of nicotine dependence: Secondary | ICD-10-CM | POA: Diagnosis not present

## 2024-10-09 DIAGNOSIS — G4733 Obstructive sleep apnea (adult) (pediatric): Secondary | ICD-10-CM

## 2024-10-09 DIAGNOSIS — J449 Chronic obstructive pulmonary disease, unspecified: Secondary | ICD-10-CM

## 2024-10-09 DIAGNOSIS — C3492 Malignant neoplasm of unspecified part of left bronchus or lung: Secondary | ICD-10-CM

## 2024-10-09 MED ORDER — ZEPBOUND 2.5 MG/0.5ML ~~LOC~~ SOAJ: 2.5000 mg | SUBCUTANEOUS | 1 refills | Status: AC
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
## 2024-10-09 NOTE — Patient Instructions (Addendum)
" °  VISIT SUMMARY: During your visit, we discussed your obstructive sleep apnea and the challenges you are facing with CPAP therapy. We also talked about your weight management options, including the potential use of the medication Zepbound .  YOUR PLAN: -OBSTRUCTIVE SLEEP APNEA: Obstructive sleep apnea is a condition where your breathing stops and starts repeatedly during sleep due to blocked airways. We adjusted your CPAP settings to a fixed pressure of 7 cm H2O, starting at 4 cm H2O and ramping up to 7 cm H2O to help you feel more comfortable. We encourage you to increase your CPAP usage, including using it during the day if needed to get used to it. If you continue to feel discomfort, we may consider a formal titration study in the lab.  -OBESITY: Obesity is a condition where you have an excessive amount of body fat, which can contribute to health problems like sleep apnea. Your BMI is 32, which qualifies as obese. We discussed the potential benefits of weight loss in reducing your sleep apnea symptoms and possibly discontinuing CPAP. We explored the option of using Zepbound , a weight loss medication. We ordered Zepbound  2.5 mg to be taken subcutaneously once a week, with the possibility of increasing the dose every 4-8 weeks based on how well you tolerate it. We also discussed dietary modifications to manage potential side effects. Please follow up in one month to assess your tolerance and adjust the dose if necessary. If Zepbound  is not covered by your insurance, follow up with your primary care doctor for alternative weight management options.  INSTRUCTIONS: Please follow up in one month to assess your tolerance to Zepbound  and adjust the dose if necessary. If Zepbound  is not covered by your insurance, follow up with your primary care doctor for alternative weight management options.  Follow-up 6 weeks with Beth NP for CPAP compliance and check in with zepbound        Notify your provider if you  are planning to have a procedure/surgery, as this medication will need to be stopped prior.   Contains text generated by [employer].   "

## 2024-10-10 NOTE — Telephone Encounter (Signed)
 Faxed CMN to [facility] at [phone]

## 2024-10-29 ENCOUNTER — Ambulatory Visit (HOSPITAL_COMMUNITY)

## 2024-11-11 ENCOUNTER — Inpatient Hospital Stay

## 2024-11-18 ENCOUNTER — Inpatient Hospital Stay: Admitting: Internal Medicine

## 2024-11-20 ENCOUNTER — Ambulatory Visit: Admitting: Primary Care

## 2024-12-17 ENCOUNTER — Ambulatory Visit: Admitting: Thoracic Surgery (Cardiothoracic Vascular Surgery)

## 2024-12-31 ENCOUNTER — Ambulatory Visit: Admitting: Internal Medicine
